# Patient Record
Sex: Female | Born: 1990
Health system: Southern US, Community
[De-identification: ages and names within clinical notes are randomized; demographics above are authoritative.]

## PROBLEM LIST (undated history)

## (undated) ENCOUNTER — Inpatient Hospital Stay (HOSPITAL_COMMUNITY): Payer: Self-pay

## (undated) DIAGNOSIS — G43909 Migraine, unspecified, not intractable, without status migrainosus: Secondary | ICD-10-CM

## (undated) DIAGNOSIS — Z973 Presence of spectacles and contact lenses: Secondary | ICD-10-CM

## (undated) DIAGNOSIS — N921 Excessive and frequent menstruation with irregular cycle: Secondary | ICD-10-CM

## (undated) DIAGNOSIS — R87619 Unspecified abnormal cytological findings in specimens from cervix uteri: Secondary | ICD-10-CM

## (undated) DIAGNOSIS — Z8742 Personal history of other diseases of the female genital tract: Secondary | ICD-10-CM

## (undated) DIAGNOSIS — A64 Unspecified sexually transmitted disease: Secondary | ICD-10-CM

## (undated) DIAGNOSIS — R87629 Unspecified abnormal cytological findings in specimens from vagina: Secondary | ICD-10-CM

## (undated) DIAGNOSIS — R569 Unspecified convulsions: Secondary | ICD-10-CM

## (undated) DIAGNOSIS — M1990 Unspecified osteoarthritis, unspecified site: Secondary | ICD-10-CM

## (undated) DIAGNOSIS — E538 Deficiency of other specified B group vitamins: Secondary | ICD-10-CM

## (undated) DIAGNOSIS — E559 Vitamin D deficiency, unspecified: Secondary | ICD-10-CM

## (undated) DIAGNOSIS — N946 Dysmenorrhea, unspecified: Secondary | ICD-10-CM

## (undated) DIAGNOSIS — N809 Endometriosis, unspecified: Secondary | ICD-10-CM

## (undated) DIAGNOSIS — D649 Anemia, unspecified: Secondary | ICD-10-CM

## (undated) DIAGNOSIS — R7303 Prediabetes: Secondary | ICD-10-CM

## (undated) DIAGNOSIS — I10 Essential (primary) hypertension: Secondary | ICD-10-CM

## (undated) DIAGNOSIS — I82409 Acute embolism and thrombosis of unspecified deep veins of unspecified lower extremity: Secondary | ICD-10-CM

## (undated) HISTORY — DX: Anemia, unspecified: D64.9

## (undated) HISTORY — DX: Essential (primary) hypertension: I10

## (undated) HISTORY — PX: WISDOM TOOTH EXTRACTION: SHX21

## (undated) HISTORY — DX: Unspecified osteoarthritis, unspecified site: M19.90

## (undated) HISTORY — PX: OTHER SURGICAL HISTORY: SHX169

## (undated) HISTORY — DX: Migraine, unspecified, not intractable, without status migrainosus: G43.909

## (undated) HISTORY — DX: Unspecified convulsions: R56.9

## (undated) HISTORY — DX: Unspecified abnormal cytological findings in specimens from vagina: R87.629

## (undated) HISTORY — DX: Endometriosis, unspecified: N80.9

## (undated) HISTORY — DX: Deficiency of other specified B group vitamins: E53.8

## (undated) HISTORY — DX: Vitamin D deficiency, unspecified: E55.9

## (undated) HISTORY — PX: CRYOTHERAPY: SHX1416

## (undated) HISTORY — DX: Unspecified sexually transmitted disease: A64

## (undated) HISTORY — DX: Prediabetes: R73.03

## (undated) HISTORY — DX: Acute embolism and thrombosis of unspecified deep veins of unspecified lower extremity: I82.409

## (undated) HISTORY — DX: Unspecified abnormal cytological findings in specimens from cervix uteri: R87.619

---
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
## 2009-01-15 ENCOUNTER — Inpatient Hospital Stay (HOSPITAL_COMMUNITY): Admission: AD | Admit: 2009-01-15 | Discharge: 2009-01-16 | Payer: Self-pay | Admitting: Obstetrics

## 2009-01-28 ENCOUNTER — Inpatient Hospital Stay (HOSPITAL_COMMUNITY): Admission: AD | Admit: 2009-01-28 | Discharge: 2009-01-31 | Payer: Self-pay | Admitting: Obstetrics

## 2010-10-20 DIAGNOSIS — F445 Conversion disorder with seizures or convulsions: Secondary | ICD-10-CM

## 2010-10-20 HISTORY — DX: Conversion disorder with seizures or convulsions: F44.5

## 2010-10-20 NOTE — L&D Delivery Note (Signed)
Delivery Note At  a viable unspecified sex was delivered via  (Presentation: ;  ).  APGAR: , ; weight .   Placenta status: , .  Cord:  with the following complications: .  Cord pH: not done  Anesthesia:   Episiotomy:  Lacerations:  Suture Repair: 2.0 Est. Blood Loss (mL):   Mom to postpartum.  Baby to nursery-stable.  Darya Bigler A 07/31/2011, 6:51 AM

## 2010-10-28 ENCOUNTER — Inpatient Hospital Stay (HOSPITAL_COMMUNITY)
Admission: EM | Admit: 2010-10-28 | Discharge: 2010-10-31 | Payer: Self-pay | Source: Home / Self Care | Attending: Internal Medicine | Admitting: Internal Medicine

## 2010-11-01 DIAGNOSIS — F449 Dissociative and conversion disorder, unspecified: Secondary | ICD-10-CM | POA: Insufficient documentation

## 2010-11-04 LAB — CBC
HCT: 37.2 % (ref 36.0–46.0)
Hemoglobin: 12.2 g/dL (ref 12.0–15.0)
MCH: 23.1 pg — ABNORMAL LOW (ref 26.0–34.0)
MCHC: 32.8 g/dL (ref 30.0–36.0)
MCV: 70.6 fL — ABNORMAL LOW (ref 78.0–100.0)
Platelets: 186 10*3/uL (ref 150–400)
RBC: 5.27 MIL/uL — ABNORMAL HIGH (ref 3.87–5.11)
RDW: 13.6 % (ref 11.5–15.5)
WBC: 6.6 10*3/uL (ref 4.0–10.5)

## 2010-11-04 LAB — URINALYSIS, ROUTINE W REFLEX MICROSCOPIC
Bilirubin Urine: NEGATIVE
Hgb urine dipstick: NEGATIVE
Ketones, ur: 15 mg/dL — AB
Nitrite: NEGATIVE
Protein, ur: NEGATIVE mg/dL
Specific Gravity, Urine: 1.029 (ref 1.005–1.030)
Urine Glucose, Fasting: 100 mg/dL — AB
Urobilinogen, UA: 1 mg/dL (ref 0.0–1.0)
pH: 6.5 (ref 5.0–8.0)

## 2010-11-04 LAB — POCT I-STAT, CHEM 8
BUN: 9 mg/dL (ref 6–23)
Calcium, Ion: 1.19 mmol/L (ref 1.12–1.32)
Chloride: 105 mEq/L (ref 96–112)
Creatinine, Ser: 0.8 mg/dL (ref 0.4–1.2)
Glucose, Bld: 126 mg/dL — ABNORMAL HIGH (ref 70–99)
HCT: 41 % (ref 36.0–46.0)
Hemoglobin: 13.9 g/dL (ref 12.0–15.0)
Potassium: 3.5 mEq/L (ref 3.5–5.1)
Sodium: 142 mEq/L (ref 135–145)
TCO2: 26 mmol/L (ref 0–100)

## 2010-11-04 LAB — D-DIMER, QUANTITATIVE: D-Dimer, Quant: 0.62 ug/mL-FEU — ABNORMAL HIGH (ref 0.00–0.48)

## 2010-11-04 LAB — GLUCOSE, CAPILLARY: Glucose-Capillary: 90 mg/dL (ref 70–99)

## 2010-11-04 LAB — HERPES SIMPLEX VIRUS(HSV) DNA BY PCR
HSV 1 DNA: NOT DETECTED
HSV 2 DNA: NOT DETECTED

## 2010-11-04 LAB — CSF CELL COUNT WITH DIFFERENTIAL
RBC Count, CSF: 1 /mm3 — ABNORMAL HIGH
Tube #: 3
WBC, CSF: 2 /mm3 (ref 0–5)

## 2010-11-04 LAB — COMPREHENSIVE METABOLIC PANEL
ALT: 13 U/L (ref 0–35)
AST: 18 U/L (ref 0–37)
Albumin: 3.7 g/dL (ref 3.5–5.2)
Alkaline Phosphatase: 48 U/L (ref 39–117)
BUN: 9 mg/dL (ref 6–23)
CO2: 26 mEq/L (ref 19–32)
Calcium: 9.2 mg/dL (ref 8.4–10.5)
Chloride: 108 mEq/L (ref 96–112)
Creatinine, Ser: 0.77 mg/dL (ref 0.4–1.2)
GFR calc Af Amer: >60 mL/min (ref >60)
GFR calc non Af Amer: >60 mL/min (ref >60)
Glucose, Bld: 129 mg/dL — ABNORMAL HIGH (ref 70–99)
Potassium: 3.5 mEq/L (ref 3.5–5.1)
Sodium: 140 mEq/L (ref 135–145)
Total Bilirubin: 0.4 mg/dL (ref 0.3–1.2)
Total Protein: 6.7 g/dL (ref 6.0–8.3)

## 2010-11-04 LAB — GRAM STAIN

## 2010-11-04 LAB — ETHANOL: Alcohol, Ethyl (B): <5 mg/dL (ref 0–10)

## 2010-11-04 LAB — PHENYTOIN LEVEL, TOTAL: Phenytoin Lvl: 17.2 ug/mL (ref 10.0–20.0)

## 2010-11-04 LAB — CK TOTAL AND CKMB (NOT AT ARMC)
CK, MB: 1.2 ng/mL (ref 0.3–4.0)
Relative Index: 1.1 (ref 0.0–2.5)
Total CK: 114 U/L (ref 7–177)

## 2010-11-04 LAB — RAPID URINE DRUG SCREEN, HOSP PERFORMED
Amphetamines: NOT DETECTED
Barbiturates: NOT DETECTED
Benzodiazepines: POSITIVE — AB
Cocaine: NOT DETECTED
Opiates: NOT DETECTED
Tetrahydrocannabinol: NOT DETECTED

## 2010-11-04 LAB — DIFFERENTIAL
Basophils Absolute: 0 10*3/uL (ref 0.0–0.1)
Basophils Relative: 1 % (ref 0–1)
Eosinophils Absolute: 0.2 10*3/uL (ref 0.0–0.7)
Eosinophils Relative: 3 % (ref 0–5)
Lymphocytes Relative: 29 % (ref 12–46)
Lymphs Abs: 1.9 10*3/uL (ref 0.7–4.0)
Monocytes Absolute: 0.5 10*3/uL (ref 0.1–1.0)
Monocytes Relative: 7 % (ref 3–12)
Neutro Abs: 4 10*3/uL (ref 1.7–7.7)
Neutrophils Relative %: 61 % (ref 43–77)

## 2010-11-04 LAB — TROPONIN I: Troponin I: 0.02 ng/mL (ref 0.00–0.06)

## 2010-11-04 LAB — CRYPTOCOCCAL ANTIGEN, CSF: Crypto Ag: NEGATIVE

## 2010-11-04 LAB — CSF CULTURE W GRAM STAIN: Culture: NO GROWTH

## 2010-11-04 LAB — POCT PREGNANCY, URINE: Preg Test, Ur: NEGATIVE

## 2010-11-04 LAB — ALBUMIN: Albumin: 2.8 g/dL — ABNORMAL LOW (ref 3.5–5.2)

## 2010-11-04 LAB — PROTEIN AND GLUCOSE, CSF
Glucose, CSF: 69 mg/dL (ref 43–76)
Total  Protein, CSF: 37 mg/dL (ref 15–45)

## 2010-11-06 LAB — MISCELLANEOUS TEST

## 2010-11-13 DIAGNOSIS — G43909 Migraine, unspecified, not intractable, without status migrainosus: Secondary | ICD-10-CM | POA: Insufficient documentation

## 2010-11-13 DIAGNOSIS — I808 Phlebitis and thrombophlebitis of other sites: Secondary | ICD-10-CM | POA: Insufficient documentation

## 2010-11-13 NOTE — Consult Note (Addendum)
[name], [name] NO.:  [id number]  MEDICAL RECORD NO.:  [id number]          PATIENT TYPE:  INP  LOCATION:  3028                         FACILITY:  [facility]  PHYSICIAN:  Joycelyn Schmid, MD   DATE OF BIRTH:  [birth year]  DATE OF CONSULTATION:  10/29/2010 DATE OF DISCHARGE:                                CONSULTATION   TIME OF CONSULTATION:  11 a.m.  REASON FOR CONSULTATION:  Seizure.  HISTORY OF PRESENT ILLNESS:  This is a 20 year old African American female with past medical history of migraines and childbirth x1.  The patient was brought to [facility] on October 28, 2010 for new- onset seizures.  Per family members, who were in the room, at approximately 10 p.m. on October 28, 2010, the patient was talking to her boyfriend on the phone.  She complained of some chest discomfort and suddenly went silent.  Her boyfriend could hear the child in the back yelling the mother's name, but there was no response.  The boyfriend immediately texted stated the mother, who called EMS and both the mother and boyfriend drove to the patient's house.  There, they found the patient nonresponsive, eyes rolled back with "twitching" activity. There is no comment if the patient was urinary incontinent, but there was no blood or sputum coming out of the patient's mouth.  The patient was transferred to [facility] ED, where the patient was given Ativan. The patient apparently had a second seizure while in the emergency department, but was not started on antiepileptic medication.  The patient was transferred to the floor on 2002, where she was watched overnight.  Per mother, she remained confused throughout the night as she did not recognize her own mother.  This morning, the patient's mother was in the room noted that her daughter was becoming very eggy. Right after becoming eggy, the patient went into the seizure-like activity.  This was described again as eyes rolled back,  all extremity twitching; however, no tongue biting, urinary incontinence, or foaming at the mouth was noted.  A code seizure was called, rapid response evaluated the patient and found the patient to be postictal.  Neurology was called at that time.  When I arrived in the room, the patient was significantly postictal, nonresponsive to pain or verbal stimuli.  I immediately called for a stat EEG, which is being done at the present time.  The patient was also started on 1.5 g of Dilantin load.  PAST MEDICAL HISTORY: 1. Migraines. 2. Wisdom teeth extraction.  MEDICATIONS:  The patient takes no medications per mother.  ALLERGIES:  LATEX.  FAMILY HISTORY:  Grandmother has diabetes and is now suffering from seizure secondary to diabetes.  Mother has hypertension.  SOCIAL HISTORY:  The patient does not smoke, drink, or do illicit drugs. Lives with her mother, has one child that is 38-year-old.  There is no history of tobacco or drug use.  REVIEW OF SYSTEMS:  The patient has in the past complained of decreased sleep, anxiety, chest discomfort, and headaches.  PHYSICAL EXAMINATION:  VITAL SIGNS:  Pulse is 92, blood pressure is 130/92, respiration at the present  time is 12, temperature is 98.4. NEUROLOGIC:  At the present time, the patient is postictal showing no purposeful movements.  Pupils are equal, round, and reactive to light and accommodating.  She has positive doll's eyes.  The patient does not respond to any noxious stimuli in all four extremities.  Deep tendon reflexes are 1+ throughout with downgoing toes.  When looking closely at her eyes, I do note a slight horizontal nystagmus occurring, but nothing overt.  I did not note any ocular deviation.  I did not note any tongue biting or blood noted in the mouth.  I did not note any urinary incontinence.  I also did not note any blink to threat. PULMONARY:  Clear to auscultation bilaterally. CARDIOVASCULAR:  S1 and S2 is  audible. NECK:  Supple.  LABORATORY DATA:  The patient received an LP on early morning of October 29, 2010.  2/3 showed colorless, 2 white blood cells, 1 red blood cell, 69 for glucose, and protein of 37.  UA was negative.  Sodium was 140, potassium 3.5, chloride 108, CO2 is 26, BUN 9, creatinine 0.77, glucose 129.  White blood cell count 6.6, platelets 186, hemoglobin 12.2, hematocrit 37.2.  IMAGING:  CT of head was negative for any mass or bleed.  ASSESSMENT:  This is a 20 year old female with new-onset of seizure x3, etiology unclear at this time.  The patient's family states that she has been under a lot of stressors this 2010, but nothing significant as of the last few days.  Mother states that she has had some decreased sleep lately.  At this time, recommendations would be obtaining EEG, which is in progress.  To continue Dilantin 100 mg t.i.d. with pharmacy to dose Dilantin level in the morning.  When capable, obtain MRI of the brain with and without contrast.     Felicie Morn, PA-C   ______________________________ Joycelyn Schmid, MD    DS/MEDQ  D:  10/29/2010  T:  10/30/2010  Job:  [id number]  Electronically Signed by Felicie Morn PA-C on 10/30/2010 12:14:03 PM Electronically Signed by Joycelyn Schmid  on 11/13/2010 02:58:24 PM

## 2010-12-26 ENCOUNTER — Inpatient Hospital Stay (HOSPITAL_COMMUNITY): Payer: Medicaid Other

## 2010-12-26 ENCOUNTER — Inpatient Hospital Stay (HOSPITAL_COMMUNITY)
Admission: AD | Admit: 2010-12-26 | Discharge: 2010-12-26 | Disposition: A | Discharge status: Home / Self Care | Payer: Medicaid Other | Source: Ambulatory Visit | Attending: Obstetrics | Admitting: Obstetrics

## 2010-12-26 DIAGNOSIS — A499 Bacterial infection, unspecified: Secondary | ICD-10-CM

## 2010-12-26 DIAGNOSIS — B9689 Other specified bacterial agents as the cause of diseases classified elsewhere: Secondary | ICD-10-CM | POA: Insufficient documentation

## 2010-12-26 DIAGNOSIS — O2390 Unspecified genitourinary tract infection in pregnancy, unspecified trimester: Secondary | ICD-10-CM | POA: Insufficient documentation

## 2010-12-26 DIAGNOSIS — O209 Hemorrhage in early pregnancy, unspecified: Secondary | ICD-10-CM

## 2010-12-26 DIAGNOSIS — N760 Acute vaginitis: Secondary | ICD-10-CM | POA: Insufficient documentation

## 2010-12-26 LAB — CBC
HCT: 36.4 % (ref 36.0–46.0)
Hemoglobin: 11.8 g/dL — ABNORMAL LOW (ref 12.0–15.0)
MCV: 70.5 fL — ABNORMAL LOW (ref 78.0–100.0)
RBC: 5.16 MIL/uL — ABNORMAL HIGH (ref 3.87–5.11)
WBC: 8.7 10*3/uL (ref 4.0–10.5)

## 2010-12-26 LAB — WET PREP, GENITAL
Trich, Wet Prep: NONE SEEN
Yeast Wet Prep HPF POC: NONE SEEN

## 2010-12-26 LAB — URINALYSIS, ROUTINE W REFLEX MICROSCOPIC
Bilirubin Urine: NEGATIVE
Glucose, UA: NEGATIVE mg/dL
Hgb urine dipstick: NEGATIVE
Ketones, ur: 40 mg/dL — AB
Protein, ur: NEGATIVE mg/dL
Urobilinogen, UA: 0.2 mg/dL (ref 0.0–1.0)

## 2010-12-26 LAB — URINE MICROSCOPIC-ADD ON

## 2010-12-26 LAB — HCG, QUANTITATIVE, PREGNANCY: hCG, Beta Chain, Quant, S: 154547 m[IU]/mL — ABNORMAL HIGH (ref <5)

## 2010-12-27 LAB — GC/CHLAMYDIA PROBE AMP, GENITAL: GC Probe Amp, Genital: NEGATIVE

## 2011-01-06 LAB — RUBELLA ANTIBODY, IGM: Rubella: NON-IMMUNE/NOT IMMUNE

## 2011-01-29 LAB — CBC
HCT: 32 % — ABNORMAL LOW (ref 36.0–46.0)
MCHC: 32.5 g/dL (ref 30.0–36.0)
MCV: 74.8 fL — ABNORMAL LOW (ref 78.0–100.0)
MCV: 75.6 fL — ABNORMAL LOW (ref 78.0–100.0)
Platelets: 134 10*3/uL — ABNORMAL LOW (ref 150–400)
RBC: 4.89 MIL/uL (ref 3.87–5.11)
RDW: 14.7 % (ref 11.5–15.5)
RDW: 14.8 % (ref 11.5–15.5)
WBC: 12.8 10*3/uL — ABNORMAL HIGH (ref 4.0–10.5)

## 2011-01-29 LAB — RPR: RPR Ser Ql: NONREACTIVE

## 2011-02-13 ENCOUNTER — Inpatient Hospital Stay (HOSPITAL_COMMUNITY)
Admission: AD | Admit: 2011-02-13 | Discharge: 2011-02-13 | Disposition: A | Discharge status: Home / Self Care | Payer: Medicaid Other | Source: Ambulatory Visit | Attending: Obstetrics | Admitting: Obstetrics

## 2011-02-13 DIAGNOSIS — N949 Unspecified condition associated with female genital organs and menstrual cycle: Secondary | ICD-10-CM | POA: Insufficient documentation

## 2011-02-13 DIAGNOSIS — B3731 Acute candidiasis of vulva and vagina: Secondary | ICD-10-CM | POA: Insufficient documentation

## 2011-02-13 DIAGNOSIS — B373 Candidiasis of vulva and vagina: Secondary | ICD-10-CM | POA: Insufficient documentation

## 2011-02-13 DIAGNOSIS — O2390 Unspecified genitourinary tract infection in pregnancy, unspecified trimester: Secondary | ICD-10-CM

## 2011-02-13 LAB — WET PREP, GENITAL

## 2011-03-23 ENCOUNTER — Emergency Department (HOSPITAL_COMMUNITY)
Admission: EM | Admit: 2011-03-23 | Discharge: 2011-03-23 | Disposition: A | Discharge status: Home / Self Care | Payer: Medicaid Other | Attending: Emergency Medicine | Admitting: Emergency Medicine

## 2011-03-23 DIAGNOSIS — O99891 Other specified diseases and conditions complicating pregnancy: Secondary | ICD-10-CM | POA: Insufficient documentation

## 2011-03-23 DIAGNOSIS — G40909 Epilepsy, unspecified, not intractable, without status epilepticus: Secondary | ICD-10-CM | POA: Insufficient documentation

## 2011-03-23 LAB — URINALYSIS, ROUTINE W REFLEX MICROSCOPIC
Bilirubin Urine: NEGATIVE
Leukocytes, UA: NEGATIVE
Nitrite: NEGATIVE
Specific Gravity, Urine: 1.023 (ref 1.005–1.030)
Urobilinogen, UA: 1 mg/dL (ref 0.0–1.0)

## 2011-03-23 LAB — CBC
HCT: 32.6 % — ABNORMAL LOW (ref 36.0–46.0)
Hemoglobin: 11.1 g/dL — ABNORMAL LOW (ref 12.0–15.0)
MCH: 23.8 pg — ABNORMAL LOW (ref 26.0–34.0)
MCHC: 34 g/dL (ref 30.0–36.0)
MCV: 70 fL — ABNORMAL LOW (ref 78.0–100.0)
RDW: 13.9 % (ref 11.5–15.5)

## 2011-03-23 LAB — DIFFERENTIAL
Eosinophils Relative: 1 % (ref 0–5)
Lymphs Abs: 1.5 10*3/uL (ref 0.7–4.0)
Monocytes Absolute: 0.5 10*3/uL (ref 0.1–1.0)
Monocytes Relative: 6 % (ref 3–12)
Neutro Abs: 6.2 10*3/uL (ref 1.7–7.7)

## 2011-03-23 LAB — MAGNESIUM: Magnesium: 1.8 mg/dL (ref 1.5–2.5)

## 2011-03-23 LAB — COMPREHENSIVE METABOLIC PANEL
ALT: 8 U/L (ref 0–35)
BUN: 6 mg/dL (ref 6–23)
CO2: 22 mEq/L (ref 19–32)
Calcium: 8.4 mg/dL (ref 8.4–10.5)
Creatinine, Ser: 0.5 mg/dL (ref 0.4–1.2)
GFR calc non Af Amer: >60 mL/min (ref >60)
Glucose, Bld: 86 mg/dL (ref 70–99)
Total Protein: 6.2 g/dL (ref 6.0–8.3)

## 2011-03-23 LAB — URINE MICROSCOPIC-ADD ON

## 2011-04-06 ENCOUNTER — Inpatient Hospital Stay (HOSPITAL_COMMUNITY)
Admission: AD | Admit: 2011-04-06 | Discharge: 2011-04-07 | Disposition: A | Discharge status: Home / Self Care | Payer: Medicaid Other | Source: Ambulatory Visit | Attending: Obstetrics | Admitting: Obstetrics

## 2011-04-06 DIAGNOSIS — O99891 Other specified diseases and conditions complicating pregnancy: Secondary | ICD-10-CM | POA: Insufficient documentation

## 2011-04-06 DIAGNOSIS — O9989 Other specified diseases and conditions complicating pregnancy, childbirth and the puerperium: Secondary | ICD-10-CM

## 2011-04-07 ENCOUNTER — Inpatient Hospital Stay (HOSPITAL_COMMUNITY): Payer: Medicaid Other

## 2011-06-15 ENCOUNTER — Encounter (HOSPITAL_COMMUNITY): Payer: Self-pay | Admitting: Emergency Medicine

## 2011-06-15 ENCOUNTER — Emergency Department (HOSPITAL_COMMUNITY)
Admission: EM | Admit: 2011-06-15 | Discharge: 2011-06-15 | Disposition: A | Discharge status: Home / Self Care | Payer: Medicaid Other | Attending: Emergency Medicine | Admitting: Emergency Medicine

## 2011-06-15 DIAGNOSIS — O99891 Other specified diseases and conditions complicating pregnancy: Secondary | ICD-10-CM | POA: Insufficient documentation

## 2011-06-15 DIAGNOSIS — R569 Unspecified convulsions: Secondary | ICD-10-CM | POA: Clinically undetermined

## 2011-06-15 LAB — POCT I-STAT, CHEM 8
Calcium, Ion: 1.16 mmol/L (ref 1.12–1.32)
Creatinine, Ser: 0.6 mg/dL (ref 0.50–1.10)
Glucose, Bld: 86 mg/dL (ref 70–99)
Hemoglobin: 12.6 g/dL (ref 12.0–15.0)
Potassium: 4.3 mEq/L (ref 3.5–5.1)

## 2011-06-15 LAB — RAPID URINE DRUG SCREEN, HOSP PERFORMED: Benzodiazepines: NOT DETECTED

## 2011-06-15 LAB — URINALYSIS, ROUTINE W REFLEX MICROSCOPIC
Bilirubin Urine: NEGATIVE
Hgb urine dipstick: NEGATIVE
Specific Gravity, Urine: 1.015 (ref 1.005–1.030)
Urobilinogen, UA: 0.2 mg/dL (ref 0.0–1.0)
pH: 5.5 (ref 5.0–8.0)

## 2011-06-15 LAB — URINE MICROSCOPIC-ADD ON

## 2011-06-15 NOTE — ED Notes (Signed)
06/15/11-  Pt has no pain, no s/s of pre-eclampsia; bp's wnl, reflexes 2+, no clonus, no swelling, no ruq pain, no visual changes/disturbances, no headache; fhr reactive and reassurring. K.Karen Huhta, RNC

## 2011-06-15 NOTE — Progress Notes (Signed)
06/15/11-RROB and Dr Ethelda Chick spoke with Dr Gaynell Face about pt, s/s, fhr reactive, tests, etc.  Dr Gaynell Face said pt may be d/c'd if fhr reactive/reassurring and cleared by ed.  EFM strip reviewed by Marcelino Freestone before taking off monitors.  Pt given antenatal d/c instructions, signed and given a copy, pt has dr appt with Dr Gaynell Face 8/27 and will follow up with him.  Pt to be d/c'd home after receiving d/c instructions and paperwork from ED.  Florentina Addison Menucha Dicesare,RNC-OB  06/15/11 1512  Fetal Heart Rate A  Baseline Rate 120 bpm  Variability 6-25 BPM  Accelerations 15 x 15  Uterine Activity  Contraction Frequency (min) none     06/15/11 1512  Fetal Heart Rate A  Baseline Rate 120 bpm  Variability 6-25 BPM  Accelerations 15 x 15  Uterine Activity  Contraction Frequency (min) none

## 2011-06-30 ENCOUNTER — Encounter (HOSPITAL_COMMUNITY): Payer: Self-pay | Admitting: *Deleted

## 2011-06-30 ENCOUNTER — Inpatient Hospital Stay (HOSPITAL_COMMUNITY)
Admission: AD | Admit: 2011-06-30 | Discharge: 2011-06-30 | Disposition: A | Discharge status: Home / Self Care | Payer: Medicaid Other | Source: Ambulatory Visit | Attending: Obstetrics | Admitting: Obstetrics

## 2011-06-30 DIAGNOSIS — O99891 Other specified diseases and conditions complicating pregnancy: Secondary | ICD-10-CM | POA: Insufficient documentation

## 2011-06-30 DIAGNOSIS — N898 Other specified noninflammatory disorders of vagina: Secondary | ICD-10-CM

## 2011-06-30 DIAGNOSIS — O26899 Other specified pregnancy related conditions, unspecified trimester: Secondary | ICD-10-CM

## 2011-06-30 LAB — WET PREP, GENITAL
Clue Cells Wet Prep HPF POC: NONE SEEN
Trich, Wet Prep: NONE SEEN
Yeast Wet Prep HPF POC: NONE SEEN

## 2011-06-30 LAB — POCT FERN TEST: Fern Test: NEGATIVE

## 2011-06-30 NOTE — ED Provider Notes (Signed)
History   Pt presents today stating she thinks she may be leaking fluid since about 1pm. She denies vag bleeding and reports GFM. She has no other complaints at this time.  No chief complaint on file.  HPI  OB History    Grav Para Term Preterm Abortions TAB SAB Ect Mult Living   2 1 1  0 0 0 0 0 0 1      Past Medical History  Diagnosis Date  . No pertinent past medical history     Past Surgical History  Procedure Date  . None   . Wisdom tooth extraction     No family history on file.  History  Substance Use Topics  . Smoking status: Never Smoker   . Smokeless tobacco: Not on file  . Alcohol Use: No    Allergies: No Known Allergies  Prescriptions prior to admission  Medication Sig Dispense Refill  . prenatal vitamin w/FE, FA (PRENATAL 1 + 1) 27-1 MG TABS Take 1 tablet by mouth daily.          Review of Systems  Constitutional: Negative for fever.  Cardiovascular: Negative for chest pain.  Gastrointestinal: Negative for nausea, vomiting, abdominal pain, diarrhea and constipation.  Genitourinary: Negative for dysuria, urgency, frequency, hematuria and flank pain.  Neurological: Negative for dizziness and headaches.  Psychiatric/Behavioral: Negative for depression and suicidal ideas.   Physical Exam   Blood pressure 111/69, pulse 88, temperature 98.4 F (36.9 C), temperature source Oral, resp. rate 20, height 5\' 8"  (1.727 m), weight 190 lb (86.183 kg).  Physical Exam  Constitutional: She is oriented to person, place, and time. She appears well-developed and well-nourished. No distress.  HENT:  Head: Normocephalic and atraumatic.  Eyes: EOM are normal. Pupils are equal, round, and reactive to light.  GI: Soft. She exhibits no distension. There is no tenderness. There is no rebound and no guarding.  Genitourinary: No bleeding around the vagina. Vaginal discharge found.       Cervix Lg/thick/closed.  Neurological: She is alert and oriented to person, place, and  time.  Skin: Skin is warm and dry. She is not diaphoretic.  Psychiatric: Her behavior is normal. Judgment and thought content normal.    MAU Course  Procedures  Fern test negative.  Wet prepped done.  Assessment and Plan  Care of this pt is being transferred to Jeani Sow, FNP.  Clinton Gallant. Rice III, DrHSc, MPAS, PA-C  06/30/2011, 6:04 PM   Henrietta Hoover, PA 06/30/11 1830  Matt Holmes, NP 06/30/11 1846

## 2011-06-30 NOTE — Progress Notes (Signed)
Dr's appt today (cervix was not checked)  About an hour later had leaking, has been off and on-- clear and thick and runny

## 2011-06-30 NOTE — ED Notes (Signed)
Discharging patient for E. Rice, PA.  Wet prep is negative.  May discharge to home.  Matt Holmes, NP 06/30/11 [phone]

## 2011-07-18 ENCOUNTER — Inpatient Hospital Stay (HOSPITAL_COMMUNITY)
Admission: AD | Admit: 2011-07-18 | Discharge: 2011-07-18 | Disposition: A | Discharge status: Home / Self Care | Payer: Medicaid Other | Source: Ambulatory Visit | Attending: Obstetrics | Admitting: Obstetrics

## 2011-07-18 ENCOUNTER — Encounter (HOSPITAL_COMMUNITY): Payer: Self-pay | Admitting: Obstetrics and Gynecology

## 2011-07-18 DIAGNOSIS — O99891 Other specified diseases and conditions complicating pregnancy: Secondary | ICD-10-CM | POA: Insufficient documentation

## 2011-07-18 NOTE — ED Provider Notes (Signed)
[name] is a 20 y.o. female at [redacted] weeks gestation who presents to MAU for ? Rupture of membranes. Sterile spec. Exam done to obtain slide to evaluate for ferning. Slide is negative for fern. RN called Dr. Gaynell Face with report on patient.   [place], [place] 07/18/11 [phone]

## 2011-07-18 NOTE — Progress Notes (Signed)
Pt states, " I was in bed last night ( 2400) and i felt water down my legs.It soaked thru my clothes. I didn't have anymore come out. I started having contractions about an hour ago, and they are 12 minutes apart."

## 2011-07-26 ENCOUNTER — Encounter (HOSPITAL_COMMUNITY): Payer: Self-pay | Admitting: *Deleted

## 2011-07-26 ENCOUNTER — Inpatient Hospital Stay (HOSPITAL_COMMUNITY)
Admission: AD | Admit: 2011-07-26 | Discharge: 2011-07-26 | Disposition: A | Discharge status: Home / Self Care | Payer: Medicaid Other | Source: Ambulatory Visit | Attending: Obstetrics | Admitting: Obstetrics

## 2011-07-26 DIAGNOSIS — O479 False labor, unspecified: Secondary | ICD-10-CM | POA: Insufficient documentation

## 2011-07-26 HISTORY — DX: Unspecified convulsions: R56.9

## 2011-07-26 NOTE — Discharge Instructions (Signed)
False Labor (Braxton Hicks Contractions) °Pregnancy is commonly associated with contractions of the uterus throughout the pregnancy. Towards the end of pregnancy (32-34 weeks), these contractions (Braxton Hicks) can develop more often and may become more forceful. This is not true labor because these contractions do not result in opening (dilatation) and thinning of the cervix. They are sometimes difficult to tell apart from true labor because these contractions can be forceful and people have different pain tolerances. You should not feel embarrassed if you go to the hospital with false labor. Sometimes, the only way to tell if you are in true labor is for your caregiver to follow the changes in the cervix. °HOW TO TELL THE DIFFERENCE BETWEEN TRUE AND FALSE LABOR °· False labor.  °· The contractions of false labor are usually shorter, irregular and not as hard as those of true labor.  °· They are often felt in the front of the lower abdomen and in the groin.  °· They may leave with walking around or changing positions while lying down.  °· They get weaker and are shorter lasting as time goes on.  °· These contractions are usually irregular.  °· They do not usually become progressively stronger, regular and closer together as with true labor.  °· True labor.  °· Contractions in true labor last 30 to 70 seconds, become very regular, usually become more intense, and increase in frequency.  °· They do not go away with walking.  °· The discomfort is usually felt in the top of the uterus and spreads to the lower abdomen and low back.  °· True labor can be determined by your caregiver with an exam. This will show that the cervix is dilating and getting thinner.  °If there are no prenatal problems or other health problems associated with the pregnancy, it is completely safe to be sent home with false labor and await the onset of true labor. °HOME CARE INSTRUCTIONS °· Keep up with your usual exercises and instructions.   °· Take medications as directed.  °· Keep your regular prenatal appointment.  °· Eat and drink lightly if you think you are going into labor.  °· If BH contractions are making you uncomfortable:  °· Change your activity position from lying down or resting to walking/walking to resting.  °· Sit and rest in a tub of warm water.  °· Drink 2 to 3 glasses of water. Dehydration may cause B-H contractions.  °· Do slow and deep breathing several times an hour.  °SEEK IMMEDIATE MEDICAL CARE IF: °· Your contractions continue to become stronger, more regular, and closer together.  °· You have a gushing, burst or leaking of fluid from the vagina.  °· An oral temperature above 100.4 develops.  °· You have passage of blood-tinged mucus.  °· You develop vaginal bleeding.  °· You develop continuous belly (abdominal) pain.  °· You have low back pain that you never had before.  °· You feel the baby’s head pushing down causing pelvic pressure.  °· The baby is not moving as much as it used to.  °Document Released: 10/06/2005 Document Re-Released: 03/26/2010 °Exit[employer]® Patient Information ©2011 [employer]. °

## 2011-07-26 NOTE — Progress Notes (Signed)
Dr Tina Griffiths notified of pt's admission and status. Aware of uterine activity, sve, fhr now reactive after being alittle flat initially and mild v-shaped variables, constant low back pain all day-worse with movement. Pt may go home.

## 2011-07-26 NOTE — Progress Notes (Signed)
Written and verbal d/c instructions given and understanding voiced. Given sheet 'Preg. And low back pain'. Has appt Monday with Dr Gaynell Face.

## 2011-07-26 NOTE — Progress Notes (Signed)
Water to pt

## 2011-07-26 NOTE — Progress Notes (Signed)
Contractions today have lessened now having back pains.

## 2011-07-31 ENCOUNTER — Encounter (HOSPITAL_COMMUNITY): Payer: Self-pay | Admitting: Anesthesiology

## 2011-07-31 ENCOUNTER — Inpatient Hospital Stay (HOSPITAL_COMMUNITY): Payer: Medicaid Other | Admitting: Anesthesiology

## 2011-07-31 ENCOUNTER — Encounter (HOSPITAL_COMMUNITY): Payer: Self-pay | Admitting: *Deleted

## 2011-07-31 ENCOUNTER — Inpatient Hospital Stay (HOSPITAL_COMMUNITY)
Admission: AD | Admit: 2011-07-31 | Discharge: 2011-08-02 | DRG: 775 | Disposition: A | Discharge status: Home / Self Care | Payer: Medicaid Other | Source: Ambulatory Visit | Attending: Obstetrics | Admitting: Obstetrics

## 2011-07-31 DIAGNOSIS — R569 Unspecified convulsions: Secondary | ICD-10-CM

## 2011-07-31 LAB — CBC
HCT: 36.2 % (ref 36.0–46.0)
Hemoglobin: 12 g/dL (ref 12.0–15.0)
RBC: 4.99 MIL/uL (ref 3.87–5.11)

## 2011-07-31 LAB — COMPREHENSIVE METABOLIC PANEL
ALT: 9 U/L (ref 0–35)
AST: 17 U/L (ref 0–37)
Alkaline Phosphatase: 158 U/L — ABNORMAL HIGH (ref 39–117)
CO2: 26 mEq/L (ref 19–32)
Chloride: 104 mEq/L (ref 96–112)
GFR calc non Af Amer: >90 mL/min (ref >90)
Sodium: 136 mEq/L (ref 135–145)
Total Bilirubin: 0.3 mg/dL (ref 0.3–1.2)

## 2011-07-31 LAB — RPR: RPR Ser Ql: NONREACTIVE

## 2011-07-31 MED ORDER — SENNOSIDES-DOCUSATE SODIUM 8.6-50 MG PO TABS
2.0000 | ORAL_TABLET | Freq: Every day | ORAL | Status: DC
  Administered 2011-07-31: 2 via ORAL

## 2011-07-31 MED ORDER — SODIUM CHLORIDE 0.9 % IV SOLN: 250.0000 mL | INTRAVENOUS | Status: DC

## 2011-07-31 MED ORDER — LIDOCAINE HCL (PF) 1 % IJ SOLN
30.0000 mL | INTRAMUSCULAR | Status: DC | PRN
  Filled 2011-07-31 (×2): qty 30

## 2011-07-31 MED ORDER — DIPHENHYDRAMINE HCL 50 MG/ML IJ SOLN: 12.5000 mg | INTRAMUSCULAR | Status: DC | PRN

## 2011-07-31 MED ORDER — LACTATED RINGERS IV SOLN: 500.0000 mL | Freq: Once | INTRAVENOUS | Status: DC

## 2011-07-31 MED ORDER — SODIUM CHLORIDE 0.9 % IJ SOLN: 3.0000 mL | INTRAMUSCULAR | Status: DC | PRN

## 2011-07-31 MED ORDER — LACTATED RINGERS IV SOLN
INTRAVENOUS | Status: DC
  Administered 2011-07-31: 125 mL/h via INTRAVENOUS

## 2011-07-31 MED ORDER — FENTANYL 2.5 MCG/ML BUPIVACAINE 1/10 % EPIDURAL INFUSION (WH - ANES)
INTRAMUSCULAR | Status: DC | PRN
  Administered 2011-07-31: 14 mL/h via EPIDURAL

## 2011-07-31 MED ORDER — SIMETHICONE 80 MG PO CHEW: 80.0000 mg | CHEWABLE_TABLET | ORAL | Status: DC | PRN

## 2011-07-31 MED ORDER — ONDANSETRON HCL 4 MG/2ML IJ SOLN: 4.0000 mg | INTRAMUSCULAR | Status: DC | PRN

## 2011-07-31 MED ORDER — FENTANYL 2.5 MCG/ML BUPIVACAINE 1/10 % EPIDURAL INFUSION (WH - ANES)
14.0000 mL/h | INTRAMUSCULAR | Status: DC
Start: 2011-07-31 — End: 2011-07-31

## 2011-07-31 MED ORDER — PHENYLEPHRINE 40 MCG/ML (10ML) SYRINGE FOR IV PUSH (FOR BLOOD PRESSURE SUPPORT)
80.0000 ug | PREFILLED_SYRINGE | INTRAVENOUS | Status: DC | PRN
  Filled 2011-07-31: qty 5

## 2011-07-31 MED ORDER — OXYTOCIN 20 UNITS IN LACTATED RINGERS INFUSION - SIMPLE
125.0000 mL/h | Freq: Once | INTRAVENOUS | Status: AC
  Administered 2011-07-31: 125 mL/h via INTRAVENOUS
  Filled 2011-07-31: qty 1000

## 2011-07-31 MED ORDER — FERROUS SULFATE 325 (65 FE) MG PO TABS
325.0000 mg | ORAL_TABLET | Freq: Two times a day (BID) | ORAL | Status: DC
  Administered 2011-07-31 – 2011-08-02 (×4): 325 mg via ORAL
  Filled 2011-07-31 (×4): qty 1

## 2011-07-31 MED ORDER — LIDOCAINE HCL 1.5 % IJ SOLN
INTRAMUSCULAR | Status: DC | PRN
  Administered 2011-07-31 (×2): 5 mL via EPIDURAL

## 2011-07-31 MED ORDER — BUTORPHANOL TARTRATE 2 MG/ML IJ SOLN: 1.0000 mg | INTRAMUSCULAR | Status: DC | PRN

## 2011-07-31 MED ORDER — FLEET ENEMA 7-19 GM/118ML RE ENEM: 1.0000 | ENEMA | RECTAL | Status: DC | PRN

## 2011-07-31 MED ORDER — WITCH HAZEL-GLYCERIN EX PADS: 1.0000 "application " | MEDICATED_PAD | CUTANEOUS | Status: DC | PRN

## 2011-07-31 MED ORDER — DIBUCAINE 1 % RE OINT: 1.0000 "application " | TOPICAL_OINTMENT | RECTAL | Status: DC | PRN

## 2011-07-31 MED ORDER — IBUPROFEN 600 MG PO TABS
600.0000 mg | ORAL_TABLET | Freq: Four times a day (QID) | ORAL | Status: DC
  Administered 2011-07-31 – 2011-08-02 (×7): 600 mg via ORAL
  Filled 2011-07-31 (×7): qty 1

## 2011-07-31 MED ORDER — OXYCODONE-ACETAMINOPHEN 5-325 MG PO TABS
2.0000 | ORAL_TABLET | ORAL | Status: DC | PRN
  Administered 2011-07-31: 2 via ORAL
  Filled 2011-07-31: qty 2

## 2011-07-31 MED ORDER — EPHEDRINE 5 MG/ML INJ
10.0000 mg | INTRAVENOUS | Status: DC | PRN
Start: 2011-07-31 — End: 2011-07-31
  Filled 2011-07-31 (×2): qty 4

## 2011-07-31 MED ORDER — OXYTOCIN BOLUS FROM INFUSION
500.0000 mL | Freq: Once | INTRAVENOUS | Status: DC
  Filled 2011-07-31: qty 500
  Filled 2011-07-31: qty 1000

## 2011-07-31 MED ORDER — CITRIC ACID-SODIUM CITRATE 334-500 MG/5ML PO SOLN: 30.0000 mL | ORAL | Status: DC | PRN

## 2011-07-31 MED ORDER — PHENYLEPHRINE 40 MCG/ML (10ML) SYRINGE FOR IV PUSH (FOR BLOOD PRESSURE SUPPORT)
80.0000 ug | PREFILLED_SYRINGE | INTRAVENOUS | Status: DC | PRN
  Filled 2011-07-31 (×2): qty 5

## 2011-07-31 MED ORDER — BENZOCAINE-MENTHOL 20-0.5 % EX AERO: 1.0000 "application " | INHALATION_SPRAY | CUTANEOUS | Status: DC | PRN

## 2011-07-31 MED ORDER — EPHEDRINE 5 MG/ML INJ
10.0000 mg | INTRAVENOUS | Status: DC | PRN
  Filled 2011-07-31: qty 4

## 2011-07-31 MED ORDER — PRENATAL PLUS 27-1 MG PO TABS
1.0000 | ORAL_TABLET | Freq: Every day | ORAL | Status: DC
  Administered 2011-08-01 – 2011-08-02 (×2): 1 via ORAL
  Filled 2011-07-31 (×2): qty 1

## 2011-07-31 MED ORDER — IBUPROFEN 600 MG PO TABS
600.0000 mg | ORAL_TABLET | Freq: Four times a day (QID) | ORAL | Status: DC | PRN
  Administered 2011-07-31: 600 mg via ORAL
  Filled 2011-07-31: qty 1

## 2011-07-31 MED ORDER — DIPHENHYDRAMINE HCL 25 MG PO CAPS: 25.0000 mg | ORAL_CAPSULE | Freq: Four times a day (QID) | ORAL | Status: DC | PRN

## 2011-07-31 MED ORDER — OXYCODONE-ACETAMINOPHEN 5-325 MG PO TABS: 1.0000 | ORAL_TABLET | ORAL | Status: DC | PRN

## 2011-07-31 MED ORDER — PROMETHAZINE HCL 25 MG/ML IJ SOLN: 12.5000 mg | INTRAMUSCULAR | Status: DC | PRN

## 2011-07-31 MED ORDER — LACTATED RINGERS IV SOLN: 500.0000 mL | INTRAVENOUS | Status: DC | PRN

## 2011-07-31 MED ORDER — ZOLPIDEM TARTRATE 5 MG PO TABS: 5.0000 mg | ORAL_TABLET | Freq: Every evening | ORAL | Status: DC | PRN

## 2011-07-31 MED ORDER — TETANUS-DIPHTH-ACELL PERTUSSIS 5-2.5-18.5 LF-MCG/0.5 IM SUSP: 0.5000 mL | Freq: Once | INTRAMUSCULAR | Status: DC

## 2011-07-31 MED ORDER — ONDANSETRON HCL 4 MG PO TABS: 4.0000 mg | ORAL_TABLET | ORAL | Status: DC | PRN

## 2011-07-31 MED ORDER — FENTANYL 2.5 MCG/ML BUPIVACAINE 1/10 % EPIDURAL INFUSION (WH - ANES)
14.0000 mL/h | INTRAMUSCULAR | Status: DC
  Filled 2011-07-31: qty 60

## 2011-07-31 MED ORDER — ACETAMINOPHEN 325 MG PO TABS: 650.0000 mg | ORAL_TABLET | ORAL | Status: DC | PRN

## 2011-07-31 MED ORDER — LANOLIN HYDROUS EX OINT: TOPICAL_OINTMENT | CUTANEOUS | Status: DC | PRN

## 2011-07-31 MED ORDER — SODIUM CHLORIDE 0.9 % IJ SOLN: 3.0000 mL | Freq: Two times a day (BID) | INTRAMUSCULAR | Status: DC

## 2011-07-31 MED ORDER — ONDANSETRON HCL 4 MG/2ML IJ SOLN: 4.0000 mg | Freq: Four times a day (QID) | INTRAMUSCULAR | Status: DC | PRN

## 2011-07-31 NOTE — H&P (Signed)
In in in in this is Dr. Francoise Ceo dictating the history and physical on  Lounell Reichl She's a 20 year old gravida 2 para 101 at 39 weeks and 6 days EDC 1012 she has been negative GBS  Was admitted in labor she is now 9 cm 0 station membranes ruptured spontaneously and the fluid was clear Past medical history negative Past surgical history negative Social history negative System review negative Physical exam well-developed female in no distress HEENT negative Lungs clear Heart regular rhythm no murmurs no gallops Breasts negative Abdomen term Pelvic as described above extremities negative and

## 2011-07-31 NOTE — Progress Notes (Signed)
Pt rerports having ctx q 4 min since 0230. Denies SROM or bleeding at this time and reportd good fetal movement

## 2011-07-31 NOTE — Plan of Care (Signed)
Problem: Discharge Progression Outcomes Goal: MMR given as ordered Outcome: Not Progressing Pt refused MMR though she is non immune.

## 2011-07-31 NOTE — Anesthesia Procedure Notes (Signed)
Epidural Patient location during procedure: OB Start time: 07/31/2011 6:05 AM End time: 07/31/2011 6:09 AM Reason for block: procedure for pain  Staffing Anesthesiologist: Sandrea Hughs Performed by: anesthesiologist   Preanesthetic Checklist Completed: patient identified, site marked, surgical consent, pre-op evaluation, timeout performed, IV checked, risks and benefits discussed and monitors and equipment checked  Epidural Patient position: sitting Prep: site prepped and draped and DuraPrep Patient monitoring: continuous pulse ox and blood pressure Approach: midline Injection technique: LOR air  Needle:  Needle type: Tuohy  Needle gauge: 17 G Needle length: 9 cm Needle insertion depth: 5 cm cm Catheter type: closed end flexible Catheter size: 19 Gauge Catheter at skin depth: 10 cm Test dose: negative and 1.5% lidocaine  Assessment Sensory level: T7 Events: blood not aspirated, injection not painful, no injection resistance, negative IV test and no paresthesia

## 2011-07-31 NOTE — Progress Notes (Signed)
Notified of SVE and ctx. Order to admit to L&D

## 2011-07-31 NOTE — Progress Notes (Signed)
Patient has history of seizure disorder and has been evaluated by neurologist, not on medication. Has displayed minimal interest in baby and touched the baby only after the grandmother insisted and encouraged. Pt and pt's mother have requested a tubal ligation to be scheduled at 6 week checkup. Pt seized during labor and had limited ability to  assist with delivery.  Encouraged and assisted patient with breastfeeding but she does not plan to continue postpartum. Candise Che, RN

## 2011-07-31 NOTE — Anesthesia Preprocedure Evaluation (Addendum)
Anesthesia Evaluation  Name, MR# and DOB Patient awake  General Assessment Comment  Reviewed: Allergy & Precautions, H&P , NPO status , Patient's Chart, lab work & pertinent test results  Airway Mallampati: II TM Distance: >3 FB Neck ROM: full    Dental No notable dental hx.    Pulmonary    Pulmonary exam normal       Cardiovascular     Neuro/Psych Negative Psych ROS   GI/Hepatic negative GI ROS Neg liver ROS    Endo/Other  Negative Endocrine ROS  Renal/GU negative Renal ROS  Genitourinary negative   Musculoskeletal   Abdominal Normal abdominal exam  (+)   Peds negative pediatric ROS (+)  Hematology negative hematology ROS (+)   Anesthesia Other Findings   Reproductive/Obstetrics (+) Pregnancy                           Anesthesia Physical Anesthesia Plan  ASA: II  Anesthesia Plan: Epidural   Post-op Pain Management:    Induction:   Airway Management Planned:   Additional Equipment:   Intra-op Plan:   Post-operative Plan:   Informed Consent: I have reviewed the patients History and Physical, chart, labs and discussed the procedure including the risks, benefits and alternatives for the proposed anesthesia with the patient or authorized representative who has indicated his/her understanding and acceptance.     Plan Discussed with:   Anesthesia Plan Comments:         Anesthesia Quick Evaluation

## 2011-08-01 LAB — CBC
Platelets: 131 10*3/uL — ABNORMAL LOW (ref 150–400)
RBC: 4.32 MIL/uL (ref 3.87–5.11)
RDW: 13.9 % (ref 11.5–15.5)
WBC: 11.2 10*3/uL — ABNORMAL HIGH (ref 4.0–10.5)

## 2011-08-01 NOTE — Progress Notes (Signed)
UR Chart review completed.  

## 2011-08-01 NOTE — Anesthesia Postprocedure Evaluation (Signed)
Anesthesia Post Note  Patient: [name]  Procedure(s) Performed: * No procedures listed *  Anesthesia type: Epidural  Patient location: Mother/Baby  Post pain: Pain level controlled  Post assessment: Post-op Vital signs reviewed  Last Vitals:  Filed Vitals:   08/01/11 1456  BP: 117/73  Pulse: 73  Temp: 98.5 F (36.9 C)  Resp: 18    Post vital signs: Reviewed  Level of consciousness: awake  Complications: No apparent anesthesia complications

## 2011-08-01 NOTE — Progress Notes (Signed)
  Postpartum day one Fundus firm Lochia moderate Legs negative No complaints 

## 2011-08-01 NOTE — Anesthesia Postprocedure Evaluation (Signed)
  Anesthesia Post-op Note  Patient: [name]  Procedure(s) Performed: * No procedures listed *  Patient Location: Mother/Baby  Anesthesia Type: Epidural  Level of Consciousness: awake and alert   Airway and Oxygen Therapy: Patient Spontanous Breathing  Post-op Pain: none  Post-op Assessment: Patient's Cardiovascular Status Stable, Respiratory Function Stable, No signs of Nausea or vomiting, Adequate PO intake and Pain level controlled  Post-op Vital Signs: Reviewed and stable  Complications: No apparent anesthesia complications

## 2011-08-01 NOTE — Plan of Care (Signed)
Problem: Discharge Progression Outcomes Goal: MMR given as ordered Outcome: Not Applicable Date Met:  08/01/11 Refused by pt - rubella-nonimmune

## 2011-08-02 MED ORDER — IBUPROFEN 600 MG PO TABS: 600.0000 mg | ORAL_TABLET | Freq: Four times a day (QID) | ORAL | Status: AC

## 2011-08-02 MED ORDER — NORETHIN ACE-ETH ESTRAD-FE 1-20 MG-MCG(24) PO TABS: 1.0000 | ORAL_TABLET | ORAL | Status: DC

## 2011-08-02 NOTE — Discharge Summary (Signed)
  Obstetric Discharge Summary Reason for Admission: onset of labor Prenatal Procedures: none Intrapartum Procedures: spontaneous vaginal delivery Postpartum Procedures: none Complications-Operative and Postpartum: none  Hemoglobin  Date Value Range Status  08/01/2011 10.3* 12.0-15.0 (g/dL) Final     HCT  Date Value Range Status  08/01/2011 31.3* 36.0-46.0 (%) Final    Discharge Diagnoses: Term Pregnancy-delivered  Discharge Information: Date: 08/02/2011 Activity: pelvic rest Diet: routine Medications: Ibuprofen, Loestrin 24 Fe Condition: stable Instructions: refer to routine discharge instructions Discharge to: home Follow-up Information    Follow up with MARSHALL,BERNARD A, MD. Call in 6 weeks.   Contact information:   [address] [place] [address] [phone]          Newborn Data: Live born  Information for the patient's newborn:  Shardee, Dieu [[id number]]  female ; APGAR , ; weight ;  Home with mother.  JACKSON-MOORE,Celesta Funderburk A 08/02/2011, 10:39 AM

## 2011-08-02 NOTE — Progress Notes (Signed)
Post Partum Day #2 S/P:spontaneous vaginal  RH status/Rubella reviewed.  Feeding: bottle. Subjective: No HA, SOB, CP, F/C, breast symptoms: No:. Normal vaginal bleeding, no clots.     Objective:  Blood pressure 116/75, pulse 76, temperature 98.1 F (36.7 C), temperature source Oral, resp. rate 18, height 5\' 8"  (1.727 m), weight 91.627 kg (202 lb), SpO2 100.00%, unknown if currently breastfeeding.   Physical Exam:  General: alert Lochia: appropriate Uterine Fundus: firm DVT Evaluation: No evidence of DVT seen on physical exam. Ext: No c/c/e  Basename 08/01/11 0525 07/31/11 0455  HGB 10.3* 12.0  HCT 31.3* 36.2    Assessment/Plan: 20 y.o.  PPD # 2 .  normal postpartum exam patient is a candidate for oral contraceptives (estrogen/progesterone) for contraception, with no contraindications Continue current postpartum care D/C home   LOS: 2 days   JACKSON-MOORE,Jessamine Barcia A 08/02/2011, 10:33 AM

## 2011-08-05 ENCOUNTER — Inpatient Hospital Stay (HOSPITAL_COMMUNITY): Admission: RE | Admit: 2011-08-05 | Payer: Medicaid Other | Source: Ambulatory Visit

## 2011-08-31 ENCOUNTER — Emergency Department (HOSPITAL_COMMUNITY)
Admission: EM | Admit: 2011-08-31 | Discharge: 2011-08-31 | Disposition: A | Discharge status: Home / Self Care | Payer: Medicaid Other | Attending: Emergency Medicine | Admitting: Emergency Medicine

## 2011-08-31 ENCOUNTER — Encounter: Payer: Self-pay | Admitting: Emergency Medicine

## 2011-08-31 ENCOUNTER — Emergency Department (HOSPITAL_COMMUNITY)
Admission: EM | Admit: 2011-08-31 | Payer: Medicaid Other | Source: Home / Self Care | Attending: Emergency Medicine | Admitting: Emergency Medicine

## 2011-08-31 DIAGNOSIS — R569 Unspecified convulsions: Secondary | ICD-10-CM | POA: Insufficient documentation

## 2011-08-31 DIAGNOSIS — G40909 Epilepsy, unspecified, not intractable, without status epilepticus: Secondary | ICD-10-CM

## 2011-08-31 LAB — POCT I-STAT, CHEM 8
Chloride: 104 mEq/L (ref 96–112)
Creatinine, Ser: 0.8 mg/dL (ref 0.50–1.10)
Glucose, Bld: 105 mg/dL — ABNORMAL HIGH (ref 70–99)
Potassium: 4.2 mEq/L (ref 3.5–5.1)

## 2011-08-31 LAB — URINALYSIS, ROUTINE W REFLEX MICROSCOPIC
Bilirubin Urine: NEGATIVE
Hgb urine dipstick: NEGATIVE
Specific Gravity, Urine: 1.02 (ref 1.005–1.030)
Urobilinogen, UA: 1 mg/dL (ref 0.0–1.0)

## 2011-08-31 LAB — DIFFERENTIAL
Basophils Absolute: 0 10*3/uL (ref 0.0–0.1)
Lymphs Abs: 1 10*3/uL (ref 0.7–4.0)
Monocytes Relative: 3 % (ref 3–12)
Neutro Abs: 4.9 10*3/uL (ref 1.7–7.7)

## 2011-08-31 LAB — CBC
HCT: 38.9 % (ref 36.0–46.0)
MCH: 24 pg — ABNORMAL LOW (ref 26.0–34.0)
MCHC: 33.4 g/dL (ref 30.0–36.0)
MCV: 71.9 fL — ABNORMAL LOW (ref 78.0–100.0)
RDW: 13 % (ref 11.5–15.5)
WBC: 6.2 10*3/uL (ref 4.0–10.5)

## 2011-08-31 LAB — URINE MICROSCOPIC-ADD ON

## 2011-08-31 NOTE — ED Notes (Signed)
Family at bedside, able to provide additional information

## 2011-08-31 NOTE — ED Provider Notes (Signed)
History     CSN: [id number] Arrival date & time: 08/31/2011 10:15 AM   First MD Initiated Contact with Patient 08/31/11 1037   11:23 AM Patient is a 20 y.o. female presenting with seizures.  Seizures  This is a chronic problem. Episode onset: this morning. The problem has been gradually improving. There was 1 seizure. The most recent episode lasted more than 5 minutes. Associated symptoms include sleepiness. Pertinent negatives include no headaches, no neck stiffness, no chest pain, no nausea and no vomiting. Characteristics include rhythmic jerking. Characteristics do not include bowel incontinence or bladder incontinence. The episode was witnessed. The seizures did not continue in the ED. The seizure(s) had no focality. There has been no fever. There were no medications administered prior to arrival.   family reports to hurt her having a seizure and that this morning. Mother states seizure lasted approximately 10 minutes patient lives drowsy afterwards. Has a history of seizures. Recently began seeing Dr. Marjory Lies. States in February he discontinued it taking Dilantin 2 tooth side effects. Has not been placed on any other seizure medication. Last seizure was 2 months ago.  Past Medical History  Diagnosis Date  . Seizures     "converted seizures", treated at [facility]    History reviewed. No pertinent past surgical history.  No family history on file.  History  Substance Use Topics  . Smoking status: Not on file  . Smokeless tobacco: Not on file  . Alcohol Use:     OB History    Grav Para Term Preterm Abortions TAB SAB Ect Mult Living                  Review of Systems  Constitutional: Negative for fever and chills.  HENT: Negative for neck pain and neck stiffness.   Respiratory: Negative for shortness of breath.   Cardiovascular: Negative for chest pain.  Gastrointestinal: Negative for nausea, vomiting, abdominal pain and bowel incontinence.  Genitourinary: Negative for  bladder incontinence, dysuria and frequency.  Musculoskeletal: Negative for back pain.  Neurological: Positive for seizures. Negative for dizziness and headaches.    Allergies  Review of patient's allergies indicates no known allergies.  Home Medications   Current Outpatient Rx  Name Route Sig Dispense Refill  . IBUPROFEN 400 MG PO TABS Oral Take 400 mg by mouth every 6 (six) hours as needed.        BP 147/94  Pulse 66  Temp(Src) 98.7 F (37.1 C) (Oral)  Resp 19  SpO2 100%  Physical Exam  Vitals reviewed. Constitutional: Vital signs are normal. She appears well-developed and well-nourished. She appears lethargic.  HENT:  Head: Normocephalic and atraumatic.  Eyes: Conjunctivae are normal. Pupils are equal, round, and reactive to light.  Neck: Normal range of motion. Neck supple.  Cardiovascular: Normal rate, regular rhythm and normal heart sounds.  Exam reveals no friction rub.   No murmur heard. Pulmonary/Chest: Effort normal and breath sounds normal. She has no wheezes. She has no rhonchi. She has no rales. She exhibits no tenderness.  Musculoskeletal: Normal range of motion.  Neurological: She appears lethargic. A sensory deficit is present. GCS eye subscore is 3. GCS verbal subscore is 4. GCS motor subscore is 5.       Patient typically becomes drowsy s/p seizure according to mother. Will reassess after labs  Skin: Skin is warm and dry. No rash noted. No erythema. No pallor.    ED Course  Procedures  Results for orders placed during the hospital  encounter of 08/31/11  CBC      Component Value Range   WBC 6.2  4.0 - 10.5 (K/uL)   RBC 5.41 (*) 3.87 - 5.11 (MIL/uL)   Hemoglobin 13.0  12.0 - 15.0 (g/dL)   HCT 16.1  09.6 - 04.5 (%)   MCV 71.9 (*) 78.0 - 100.0 (fL)   MCH 24.0 (*) 26.0 - 34.0 (pg)   MCHC 33.4  30.0 - 36.0 (g/dL)   RDW 40.9  81.1 - 91.4 (%)   Platelets 164  150 - 400 (K/uL)  DIFFERENTIAL      Component Value Range   Neutrophils Relative 80 (*) 43 - 77  (%)   Lymphocytes Relative 16  12 - 46 (%)   Monocytes Relative 3  3 - 12 (%)   Eosinophils Relative 1  0 - 5 (%)   Basophils Relative 0  0 - 1 (%)   Neutro Abs 4.9  1.7 - 7.7 (K/uL)   Lymphs Abs 1.0  0.7 - 4.0 (K/uL)   Monocytes Absolute 0.2  0.1 - 1.0 (K/uL)   Eosinophils Absolute 0.1  0.0 - 0.7 (K/uL)   Basophils Absolute 0.0  0.0 - 0.1 (K/uL)   Smear Review MORPHOLOGY UNREMARKABLE    URINALYSIS, ROUTINE W REFLEX MICROSCOPIC      Component Value Range   Color, Urine YELLOW  YELLOW    Appearance CLEAR  CLEAR    Specific Gravity, Urine 1.020  1.005 - 1.030    pH 7.0  5.0 - 8.0    Glucose, UA NEGATIVE  NEGATIVE (mg/dL)   Hgb urine dipstick NEGATIVE  NEGATIVE    Bilirubin Urine NEGATIVE  NEGATIVE    Ketones, ur NEGATIVE  NEGATIVE (mg/dL)   Protein, ur NEGATIVE  NEGATIVE (mg/dL)   Urobilinogen, UA 1.0  0.0 - 1.0 (mg/dL)   Nitrite NEGATIVE  NEGATIVE    Leukocytes, UA TRACE (*) NEGATIVE   POCT I-STAT, CHEM 8      Component Value Range   Sodium 140  135 - 145 (mEq/L)   Potassium 4.2  3.5 - 5.1 (mEq/L)   Chloride 104  96 - 112 (mEq/L)   BUN 8  6 - 23 (mg/dL)   Creatinine, Ser 7.82  0.50 - 1.10 (mg/dL)   Glucose, Bld 956 (*) 70 - 99 (mg/dL)   Calcium, Ion 2.13  0.86 - 1.32 (mmol/L)   TCO2 24  0 - 100 (mmol/L)   Hemoglobin 15.0  12.0 - 15.0 (g/dL)   HCT 57.8  46.9 - 62.9 (%)  URINE MICROSCOPIC-ADD ON      Component Value Range   Squamous Epithelial / LPF RARE  RARE    WBC, UA 3-6  <3 (WBC/hpf)   Bacteria, UA MANY (*) RARE    Urine-Other MUCOUS PRESENT      Patient is back to normal mental status and is requesting a sandwich. Labs  WNL. Family and patient states they will f/u with Dr. Marjory Lies.   MDM          Thomasene Lot, PA 08/31/11 1320

## 2011-08-31 NOTE — ED Notes (Signed)
Pt a/o x 4, resting quietly, skin warm and dry, respirations even and unlabored, no acute distress noted, ambulating in room without difficulty. No seizure activity while in ER

## 2011-08-31 NOTE — ED Provider Notes (Signed)
Medical screening examination/treatment/procedure(s) were performed by non-physician practitioner and as supervising physician I was immediately available for consultation/collaboration.  Abram Sax R. Britini Garcilazo, MD 08/31/11 1625 

## 2011-08-31 NOTE — ED Notes (Signed)
Phlebotomy at bedside.

## 2011-08-31 NOTE — ED Notes (Signed)
Bed:WA14<BR> Expected date:<BR> Expected time:<BR> Means of arrival:<BR> Comments:<BR>

## 2011-08-31 NOTE — ED Notes (Signed)
Per EMS, per pt's family, pt had a seizure lasting 10 minutes, seizures don't happen often, last seizure 2 months ago, "long postical phase" per family, pt also c/o migraines for several days. Pt was responsive to painful stimuli upon EMS arrival, purposeful eye movement. Upon arrival to ER, pt eyes open spontaneously, drowsy , appears postical

## 2011-09-01 ENCOUNTER — Encounter (HOSPITAL_COMMUNITY): Payer: Self-pay | Admitting: *Deleted

## 2011-11-14 ENCOUNTER — Encounter (HOSPITAL_COMMUNITY): Payer: Self-pay | Admitting: *Deleted

## 2011-11-14 ENCOUNTER — Emergency Department (HOSPITAL_COMMUNITY)
Admission: EM | Admit: 2011-11-14 | Discharge: 2011-11-15 | Disposition: A | Discharge status: Home / Self Care | Payer: Medicaid Other | Attending: Emergency Medicine | Admitting: Emergency Medicine

## 2011-11-14 DIAGNOSIS — G40109 Localization-related (focal) (partial) symptomatic epilepsy and epileptic syndromes with simple partial seizures, not intractable, without status epilepticus: Secondary | ICD-10-CM | POA: Insufficient documentation

## 2011-11-14 MED ORDER — ONDANSETRON HCL 4 MG/2ML IJ SOLN
4.0000 mg | Freq: Once | INTRAMUSCULAR | Status: AC
  Administered 2011-11-14: 4 mg via INTRAVENOUS
  Filled 2011-11-14: qty 2

## 2011-11-14 MED ORDER — SODIUM CHLORIDE 0.9 % IV BOLUS (SEPSIS)
250.0000 mL | Freq: Once | INTRAVENOUS | Status: AC
  Administered 2011-11-14: via INTRAVENOUS

## 2011-11-14 NOTE — ED Notes (Signed)
Pt noted to have a seizure at home as was witnessed by her mother.  Pt has a hx of seizures during pregnancy but none otherwise.

## 2011-11-14 NOTE — ED Notes (Signed)
Pt comes from home via [facility] where she was witnessed to have seizure activity by her mother.

## 2011-11-14 NOTE — ED Provider Notes (Signed)
History     CSN: [id number]  Arrival date & time 11/14/11  2331   First MD Initiated Contact with Patient 11/14/11 2334      Chief Complaint  Patient presents with  . Seizures    s/p seizure    (Consider location/radiation/quality/duration/timing/severity/associated sxs/prior treatment) HPI Comments: Patient was first noted to have seizures during her pregnancy, which ended in November 2012.  Tonight.  Mother reports that she had a 45 minute period where she had repeated episodes of staring that would last anywhere from 30 seconds to a couple minutes and then she had one episode of vomiting and is concerned.  Mom, so she called EMS for transport for further evaluation  Patient is a 21 y.o. female presenting with seizures. The history is provided by the patient.  Seizures  This is a recurrent problem. There were 4 to 5 seizures. The most recent episode lasted less than 30 seconds. Pertinent negatives include no cough. Characteristics include eye blinking. Characteristics do not include bowel incontinence, bladder incontinence, rhythmic jerking or loss of consciousness.    Past Medical History  Diagnosis Date  . Seizures   . Seizures     "converted seizures", treated at [facility]    Past Surgical History  Procedure Date  . None   . Wisdom tooth extraction     History reviewed. No pertinent family history.  History  Substance Use Topics  . Smoking status: Not on file  . Smokeless tobacco: Not on file  . Alcohol Use: No    OB History    Grav Para Term Preterm Abortions TAB SAB Ect Mult Living   2 2 2  0 0 0 0 0 0 2      Review of Systems  Constitutional: Negative for fever and chills.  HENT: Negative for congestion.   Respiratory: Negative for cough.   Gastrointestinal: Negative for bowel incontinence.  Genitourinary: Negative for bladder incontinence and dysuria.  Neurological: Positive for seizures. Negative for dizziness, loss of consciousness and weakness.     Allergies  Review of patient's allergies indicates no known allergies.  Home Medications   Current Outpatient Rx  Name Route Sig Dispense Refill  . IBUPROFEN 400 MG PO TABS Oral Take 600 mg by mouth every 6 (six) hours as needed. For pain    . MEDROXYPROGESTERONE ACETATE 150 MG/ML IM SUSP Intramuscular Inject 150 mg into the muscle every 3 (three) months.      BP 107/53  Pulse 75  SpO2 100%  Physical Exam  Constitutional: She appears well-developed.  HENT:  Head: Normocephalic.  Eyes: Pupils are equal, round, and reactive to light.  Neck: Normal range of motion.  Pulmonary/Chest: Effort normal.  Musculoskeletal: Normal range of motion.  Neurological: She is alert.       Communicates slow to respond, but is appropriate  Skin: Skin is warm and dry.    ED Course  Procedures (including critical care time)  Labs Reviewed  CBC - Abnormal; Notable for the following:    HCT 35.7 (*)    MCV 70.7 (*)    MCH 23.8 (*)    All other components within normal limits  POCT I-STAT, CHEM 8 - Abnormal; Notable for the following:    Glucose, Bld 102 (*)    All other components within normal limits  DIFFERENTIAL  URINE RAPID DRUG SCREEN (HOSP PERFORMED)  POCT PREGNANCY, URINE  I-STAT, CHEM 8  POCT PREGNANCY, URINE   No results found.   1. Seizure disorder, simple  partial       MDM  Focal seizures Review of labs shows that she has a normal white count, normal electrolytes.  She's not pregnant.  Has no drugs within her system.  We'll continue to monitor patient for further  seizure, activity Patient has had no further seizure activity.  Family member at the bedside said that she has been evaluated by our local neurologist, as well as at [facility], no definitive cause for her seizures has been identified, and she has not been placed on any medication      Arman Filter, NP 11/14/11 2351  Arman Filter, NP 11/15/11 0250  Medical screening  examination/treatment/procedure(s) were performed by non-physician practitioner and as supervising physician I was immediately available for consultation/collaboration.   Sunnie Nielsen, MD 11/15/11 [phone]

## 2011-11-15 LAB — CBC
HCT: 35.7 % — ABNORMAL LOW (ref 36.0–46.0)
Hemoglobin: 12 g/dL (ref 12.0–15.0)
RBC: 5.05 MIL/uL (ref 3.87–5.11)
WBC: 8 10*3/uL (ref 4.0–10.5)

## 2011-11-15 LAB — DIFFERENTIAL
Band Neutrophils: 0 % (ref 0–10)
Basophils Absolute: 0 K/uL (ref 0.0–0.1)
Basophils Relative: 0 % (ref 0–1)
Blasts: 0 %
Eosinophils Absolute: 0.2 K/uL (ref 0.0–0.7)
Eosinophils Relative: 2 % (ref 0–5)
Lymphocytes Relative: 37 % (ref 12–46)
Lymphs Abs: 3 K/uL (ref 0.7–4.0)
Metamyelocytes Relative: 0 %
Monocytes Absolute: 0.6 K/uL (ref 0.1–1.0)
Monocytes Relative: 7 % (ref 3–12)
Myelocytes: 0 %
Neutro Abs: 4.2 K/uL (ref 1.7–7.7)
Neutrophils Relative %: 54 % (ref 43–77)
Promyelocytes Absolute: 0 %
nRBC: 0 /100{WBCs}

## 2011-11-15 LAB — RAPID URINE DRUG SCREEN, HOSP PERFORMED
Benzodiazepines: NOT DETECTED
Cocaine: NOT DETECTED
Opiates: NOT DETECTED

## 2011-11-15 LAB — POCT I-STAT, CHEM 8
BUN: 9 mg/dL (ref 6–23)
Creatinine, Ser: 0.8 mg/dL (ref 0.50–1.10)
Sodium: 143 mEq/L (ref 135–145)
TCO2: 23 mmol/L (ref 0–100)

## 2012-01-02 ENCOUNTER — Encounter (HOSPITAL_COMMUNITY): Payer: Self-pay

## 2012-01-02 ENCOUNTER — Emergency Department (HOSPITAL_COMMUNITY)
Admission: EM | Admit: 2012-01-02 | Discharge: 2012-01-02 | Disposition: A | Discharge status: Home / Self Care | Payer: 59 | Source: Home / Self Care | Attending: Emergency Medicine | Admitting: Emergency Medicine

## 2012-01-02 DIAGNOSIS — K5289 Other specified noninfective gastroenteritis and colitis: Secondary | ICD-10-CM

## 2012-01-02 DIAGNOSIS — B349 Viral infection, unspecified: Secondary | ICD-10-CM

## 2012-01-02 DIAGNOSIS — B9789 Other viral agents as the cause of diseases classified elsewhere: Secondary | ICD-10-CM

## 2012-01-02 DIAGNOSIS — K529 Noninfective gastroenteritis and colitis, unspecified: Secondary | ICD-10-CM

## 2012-01-02 MED ORDER — LOPERAMIDE HCL 2 MG PO CAPS: 2.0000 mg | ORAL_CAPSULE | Freq: Four times a day (QID) | ORAL | Status: AC | PRN

## 2012-01-02 MED ORDER — ONDANSETRON HCL 4 MG PO TABS: 4.0000 mg | ORAL_TABLET | Freq: Three times a day (TID) | ORAL | Status: AC | PRN

## 2012-01-02 NOTE — ED Provider Notes (Signed)
History     CSN: [id number]  Arrival date & time 01/02/12  0844   First MD Initiated Contact with Patient 01/02/12 [phone]      Chief Complaint  Patient presents with  . Emesis  . Diarrhea  . URI    (Consider location/radiation/quality/duration/timing/severity/associated sxs/prior treatment) HPI Comments: [name] presents for evaluation of persistent nausea, vomiting, diarrhea, body aches, and sore throat since last weekend. She reports vomiting several times per day. She also reports several episodes of watery diarrhea with the last episode being yesterday. She continues to tolerate food and liquids by mouth well. However, she says, "it comes back up later." She also reports abdominal soreness from retching, and diarrhea. 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  Patient is a 21 y.o. female presenting with vomiting, diarrhea, and URI. The history is provided by the patient.  Emesis  This is a new problem. The current episode started more than 2 days ago. The problem occurs 2 to 4 times per day. The problem has not changed since onset.There has been no fever. Associated symptoms include abdominal pain, cough, diarrhea and URI.  Diarrhea The primary symptoms include abdominal pain, nausea, vomiting and diarrhea.  URI The primary symptoms include sore throat, cough, abdominal pain, nausea and vomiting. The current episode started 3 to 5 days ago. This is a new problem. The problem has not changed since onset. The vomiting began more than 2 days ago. Vomiting occurs 2 to 5 times per day.  The illness is not associated with congestion or rhinorrhea.    Past Medical History  Diagnosis Date  . Seizures   . Seizures     "converted seizures", treated at [facility]    Past Surgical History  Procedure Date  . None   . Wisdom tooth extraction     No family history on file.  History  Substance Use Topics  . Smoking status:  Never Smoker   . Smokeless tobacco: Not on file  . Alcohol Use: No    OB History    Grav Para Term Preterm Abortions TAB SAB Ect Mult Living   2 2 2  0 0 0 0 0 0 2      Review of Systems  Constitutional: Negative.   HENT: Positive for sore throat. Negative for congestion and rhinorrhea.   Eyes: Negative.   Respiratory: Positive for cough.   Cardiovascular: Negative.   Gastrointestinal: Positive for nausea, vomiting, abdominal pain and diarrhea.  Genitourinary: Negative.   Musculoskeletal: Negative.   Skin: Negative.   Neurological: Negative.     Allergies  Review of patient's allergies indicates no known allergies.  Home Medications   Current Outpatient Rx  Name Route Sig Dispense Refill  . IBUPROFEN 400 MG PO TABS Oral Take 600 mg by mouth every 6 (six) hours as needed. For pain    . MEDROXYPROGESTERONE ACETATE 150 MG/ML IM SUSP Intramuscular Inject 150 mg into the muscle every 3 (three) months.      BP 124/77  Pulse 61  Temp(Src) 99.2 F (37.3 C) (Oral)  Resp 16  SpO2 100%  Physical Exam  Nursing note and vitals reviewed. Constitutional: She is oriented to person, place, and time. She appears well-developed and well-nourished.  HENT:  Head: Normocephalic and atraumatic.  Right Ear: Tympanic membrane normal.  Left Ear: Tympanic membrane normal.  Mouth/Throat: Uvula is midline, oropharynx is clear and moist and mucous membranes are normal.  Eyes: EOM  are normal.  Neck: Normal range of motion and full passive range of motion without pain.  Cardiovascular: Normal rate, regular rhythm and normal heart sounds.   No murmur heard. Pulmonary/Chest: Effort normal and breath sounds normal. She has no decreased breath sounds. She has no wheezes. She has no rhonchi.  Abdominal: Soft. Normal appearance and bowel sounds are normal. There is generalized tenderness.  Musculoskeletal: Normal range of motion.  Neurological: She is alert and oriented to person, place, and time.    Skin: Skin is warm and dry.  Psychiatric: Her behavior is normal.    ED Course  Procedures (including critical care time)  Labs Reviewed - No data to display No results found.   No diagnosis found.    MDM  Likely viral gastroenteritis; given rx for ondansetron and loperamide; return to care should sx worsen        Renaee Munda, MD 01/02/12 1050

## 2012-01-02 NOTE — ED Notes (Signed)
C/o not feeling well since this weekend.  Reports vomiting, diarrhea, cough, sore throat and body aches.  States sx worse since Tuesday.  Last vomited early this am and last had diarrhea yesterday. Denies fever.

## 2012-01-02 NOTE — Discharge Instructions (Signed)
Take medications (ondansetron for nausea, and loperamide for diarrhea) as instructed. Consume clear liquids such as water, Sprite, gingerale, light juices for the next 12 to 24 hours, then advance as tolerated to [employer] (bananas, rice, applesauce, toast) and bland things such as soup, crackers, etc. Stop taking medications and return if any blood in stool or any fevers, or you are unable to eat or drink.

## 2012-01-27 ENCOUNTER — Other Ambulatory Visit: Payer: Self-pay | Admitting: Obstetrics

## 2012-12-31 ENCOUNTER — Ambulatory Visit: Admit: 2012-12-31 | Payer: Medicaid Other | Admitting: Obstetrics & Gynecology

## 2012-12-31 SURGERY — LIGATION, FALLOPIAN TUBE, LAPAROSCOPIC
Anesthesia: General | Laterality: Bilateral

## 2013-01-23 ENCOUNTER — Encounter (HOSPITAL_COMMUNITY): Payer: Self-pay | Admitting: Emergency Medicine

## 2013-01-23 ENCOUNTER — Emergency Department (HOSPITAL_COMMUNITY)
Admission: EM | Admit: 2013-01-23 | Discharge: 2013-01-23 | Disposition: A | Discharge status: Home / Self Care | Payer: Medicaid Other | Attending: Emergency Medicine | Admitting: Emergency Medicine

## 2013-01-23 ENCOUNTER — Emergency Department (HOSPITAL_COMMUNITY): Admission: EM | Admit: 2013-01-23 | Payer: Medicaid Other | Source: Home / Self Care

## 2013-01-23 DIAGNOSIS — Z3202 Encounter for pregnancy test, result negative: Secondary | ICD-10-CM | POA: Insufficient documentation

## 2013-01-23 DIAGNOSIS — R569 Unspecified convulsions: Secondary | ICD-10-CM

## 2013-01-23 DIAGNOSIS — G40909 Epilepsy, unspecified, not intractable, without status epilepticus: Secondary | ICD-10-CM | POA: Insufficient documentation

## 2013-01-23 LAB — POCT I-STAT, CHEM 8
BUN: 7 mg/dL (ref 6–23)
Calcium, Ion: 1.04 mmol/L — ABNORMAL LOW (ref 1.12–1.23)
Creatinine, Ser: 0.7 mg/dL (ref 0.50–1.10)
Glucose, Bld: 96 mg/dL (ref 70–99)
TCO2: 21 mmol/L (ref 0–100)

## 2013-01-23 LAB — RAPID URINE DRUG SCREEN, HOSP PERFORMED
Barbiturates: NOT DETECTED
Benzodiazepines: NOT DETECTED
Cocaine: NOT DETECTED

## 2013-01-23 LAB — PREGNANCY, URINE: Preg Test, Ur: NEGATIVE

## 2013-01-23 LAB — URINALYSIS, ROUTINE W REFLEX MICROSCOPIC
Bilirubin Urine: NEGATIVE
Nitrite: NEGATIVE
Specific Gravity, Urine: 1.023 (ref 1.005–1.030)
pH: 5.5 (ref 5.0–8.0)
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
## 2013-01-23 NOTE — ED Notes (Signed)
Per EMS family witness 7 min seizure, positive LOC. Pt alert and orient at this time. PIV placed by EMS.

## 2013-01-24 NOTE — ED Provider Notes (Signed)
Medical screening examination/treatment/procedure(s) were performed by non-physician practitioner and as supervising physician I was immediately available for consultation/collaboration.   Loren Racer, MD 01/24/13 0530

## 2013-03-03 DIAGNOSIS — Z0289 Encounter for other administrative examinations: Secondary | ICD-10-CM

## 2013-04-04 ENCOUNTER — Emergency Department (HOSPITAL_COMMUNITY): Payer: Medicaid Other

## 2013-04-04 ENCOUNTER — Encounter (HOSPITAL_COMMUNITY): Payer: Self-pay | Admitting: *Deleted

## 2013-04-04 ENCOUNTER — Emergency Department (HOSPITAL_COMMUNITY)
Admission: EM | Admit: 2013-04-04 | Discharge: 2013-04-04 | Disposition: A | Discharge status: Home / Self Care | Payer: Medicaid Other | Attending: Emergency Medicine | Admitting: Emergency Medicine

## 2013-04-04 DIAGNOSIS — Y9389 Activity, other specified: Secondary | ICD-10-CM | POA: Insufficient documentation

## 2013-04-04 DIAGNOSIS — Z8669 Personal history of other diseases of the nervous system and sense organs: Secondary | ICD-10-CM | POA: Insufficient documentation

## 2013-04-04 DIAGNOSIS — S62112A Displaced fracture of triquetrum [cuneiform] bone, left wrist, initial encounter for closed fracture: Secondary | ICD-10-CM

## 2013-04-04 DIAGNOSIS — X500XXA Overexertion from strenuous movement or load, initial encounter: Secondary | ICD-10-CM | POA: Insufficient documentation

## 2013-04-04 DIAGNOSIS — R209 Unspecified disturbances of skin sensation: Secondary | ICD-10-CM | POA: Insufficient documentation

## 2013-04-04 DIAGNOSIS — S62113A Displaced fracture of triquetrum [cuneiform] bone, unspecified wrist, initial encounter for closed fracture: Secondary | ICD-10-CM | POA: Insufficient documentation

## 2013-04-04 DIAGNOSIS — Y9289 Other specified places as the place of occurrence of the external cause: Secondary | ICD-10-CM | POA: Insufficient documentation

## 2013-04-04 MED ORDER — OXYCODONE-ACETAMINOPHEN 5-325 MG PO TABS: 1.0000 | ORAL_TABLET | ORAL | Status: DC | PRN

## 2013-04-04 MED ORDER — OXYCODONE-ACETAMINOPHEN 5-325 MG PO TABS: 1.0000 | ORAL_TABLET | Freq: Once | ORAL | Status: DC

## 2013-04-04 NOTE — ED Notes (Signed)
Ortho tech called and will be here to apply splint

## 2013-04-04 NOTE — ED Provider Notes (Signed)
History     CSN: [id number]  Arrival date & time 04/04/13  1815   First MD Initiated Contact with Patient 04/04/13 1932      Chief Complaint  Patient presents with  . Wrist Pain    (Consider location/radiation/quality/duration/timing/severity/associated sxs/prior treatment) HPI  Patient is a 22 year old female presenting to the emergency department for left hand and wrist non-radiating pain that occurred after putting pressure on left hand to get out of bed. Pt noted immediate swelling and nonradiating pain in the left wrist after injury. Notes some numbness in thenar eminence with decreased range of motion and thumb. Rates pain 10/10. Alleviating factors include ice. Aggravating factors include movement.   Past Medical History  Diagnosis Date  . Seizures   . Seizures     "converted seizures", treated at [facility]     Past Surgical History  Procedure Laterality Date  . None    . Wisdom tooth extraction      History reviewed. No pertinent family history.  History  Substance Use Topics  . Smoking status: Never Smoker   . Smokeless tobacco: Not on file  . Alcohol Use: No    OB History   Grav Para Term Preterm Abortions TAB SAB Ect Mult Living   2 2 2  0 0 0 0 0 0 2      Review of Systems  Constitutional: Negative for fever and chills.  Musculoskeletal: Positive for joint swelling.  Skin: Positive for color change.    Allergies  Review of patient's allergies indicates no known allergies.  Home Medications   Current Outpatient Rx  Name  Route  Sig  Dispense  Refill  . medroxyPROGESTERone (DEPO-PROVERA) 150 MG/ML injection   Intramuscular   Inject 150 mg into the muscle every 3 (three) months.           BP 150/71  Pulse 80  Temp(Src) 99.3 F (37.4 C) (Oral)  Resp 18  SpO2 98%  LMP 02/06/2013  Physical Exam  Constitutional: She is oriented to person, place, and time. She appears well-developed and well-nourished. No distress.  HENT:  Head:  Normocephalic and atraumatic.  Eyes: Conjunctivae are normal.  Neck: Neck supple.  Cardiovascular:  Pulses:      Radial pulses are 2+ on the right side, and 2+ on the left side.  Musculoskeletal:       Left wrist: She exhibits decreased range of motion, tenderness, bony tenderness, swelling and deformity. She exhibits no laceration.  Neurological: She is alert and oriented to person, place, and time.  Skin: Skin is warm and dry. She is not diaphoretic.  Psychiatric: She has a normal mood and affect.    ED Course  Procedures (including critical care time)  Labs Reviewed - No data to display Dg Wrist Complete Left  04/04/2013   *RADIOLOGY REPORT*  Clinical Data: Fall today.  Wrist pain.  LEFT WRIST - COMPLETE 3+ VIEW  Comparison: None.  Findings: The carpals are located.  On the lateral view, there is prominent soft tissue swelling dorsal to the carpals.  There is a linear small bony fragment seen posteriorly at the level the carpals on the lateral view, suspicious for an acute triquetral fracture.  The distal radius and distal ulna and remainder of the carpals are intact.  IMPRESSION: Findings  consistent with an acute triquetral fracture with prominent adjacent soft tissue swelling.   Original Report Authenticated By: Britta Mccreedy, M.D.     1. Triquetral fracture, left, closed, initial encounter  MDM  Patient with obvious deformity of left distal ulnar region. Neurovascularly intact. X-ray shows fracture. Pain managed in ED. Wrist/hand splint w/ volar splint. Hand surgeon follow up advised as soon as possible for further evaluation. Patient is agreeable to plan. Patient is stable at time of discharge          Jeannetta Ellis, PA-C 04/05/13 0112

## 2013-04-04 NOTE — Progress Notes (Signed)
Orthopedic Tech Progress Note Patient Details:  [name] 1990-12-22 [id number]  Ortho Devices Type of Ortho Device: Ace wrap;Volar splint Ortho Device/Splint Location: left hamd Ortho Device/Splint Interventions: Application   [name], [name] 04/04/2013, 9:03 PM

## 2013-04-04 NOTE — ED Notes (Signed)
Patient stated she was getting up from the bed this morning, pushed herself up with her left wrist and felt it pop.  Ice pack given

## 2013-04-04 NOTE — ED Notes (Signed)
Pt reports putting pressure on left hand/wrist and now having pain/swelling to wrist. +left radial.

## 2013-04-04 NOTE — ED Notes (Signed)
Patient transported to X-ray 

## 2013-04-07 NOTE — ED Provider Notes (Signed)
Medical screening examination/treatment/procedure(s) were performed by non-physician practitioner and as supervising physician I was immediately available for consultation/collaboration.   Samuel Rittenhouse, MD 04/07/13 0713 

## 2013-06-08 ENCOUNTER — Encounter (HOSPITAL_COMMUNITY): Payer: Self-pay | Admitting: Emergency Medicine

## 2013-06-08 ENCOUNTER — Emergency Department (HOSPITAL_COMMUNITY)
Admission: EM | Admit: 2013-06-08 | Discharge: 2013-06-08 | Disposition: A | Discharge status: Home / Self Care | Payer: Medicaid Other | Attending: Emergency Medicine | Admitting: Emergency Medicine

## 2013-06-08 DIAGNOSIS — Z8669 Personal history of other diseases of the nervous system and sense organs: Secondary | ICD-10-CM | POA: Insufficient documentation

## 2013-06-08 DIAGNOSIS — M62838 Other muscle spasm: Secondary | ICD-10-CM

## 2013-06-08 DIAGNOSIS — Z791 Long term (current) use of non-steroidal anti-inflammatories (NSAID): Secondary | ICD-10-CM | POA: Insufficient documentation

## 2013-06-08 DIAGNOSIS — M5380 Other specified dorsopathies, site unspecified: Secondary | ICD-10-CM | POA: Insufficient documentation

## 2013-06-08 MED ORDER — IBUPROFEN 800 MG PO TABS: 800.0000 mg | ORAL_TABLET | Freq: Three times a day (TID) | ORAL | Status: DC

## 2013-06-08 MED ORDER — CYCLOBENZAPRINE HCL 10 MG PO TABS: 10.0000 mg | ORAL_TABLET | Freq: Two times a day (BID) | ORAL | Status: DC | PRN

## 2013-06-08 MED ORDER — CYCLOBENZAPRINE HCL 10 MG PO TABS
10.0000 mg | ORAL_TABLET | Freq: Once | ORAL | Status: AC
  Administered 2013-06-08: 10 mg via ORAL
  Filled 2013-06-08: qty 1

## 2013-06-08 NOTE — ED Notes (Signed)
Pt escorted to discharge window. Pt verbalized understanding discharge instructions. In no acute distress.  

## 2013-06-08 NOTE — ED Provider Notes (Signed)
CSN: [id number]     Arrival date & time 06/08/13  1018 History     First MD Initiated Contact with Patient 06/08/13 1032     Chief Complaint  Patient presents with  . Neck Pain   (Consider location/radiation/quality/duration/timing/severity/associated sxs/prior Treatment) HPI Comments: Patient is a 22 year old female presenting to the emergency department for 2 years of mild to moderate right-sided neck pain. Describes the pain as a "tight knot" worse in the morning and better after some light stretching. Patient has not tried any over-the-counter medication for symptomatic care for her pain. She is not talk to her primary care doctor about this. No other aggravating factors. Patient does not remember any injury to her neck prior to the onset of her neck pain. Denies fevers, chills, numbness or weakness, visual disturbance, headache.  Patient is a 22 y.o. female presenting with neck pain.  Neck Pain Associated symptoms: no chest pain, no fever and no headaches     Past Medical History  Diagnosis Date  . Seizures   . Seizures     "converted seizures", treated at [facility]   Past Surgical History  Procedure Laterality Date  . None    . Wisdom tooth extraction     No family history on file. History  Substance Use Topics  . Smoking status: Never Smoker   . Smokeless tobacco: Not on file  . Alcohol Use: No   OB History   Grav Para Term Preterm Abortions TAB SAB Ect Mult Living   2 2 2  0 0 0 0 0 0 2     Review of Systems  Constitutional: Negative for fever and chills.  HENT: Positive for neck pain.   Eyes: Negative for visual disturbance.  Respiratory: Negative for shortness of breath.   Cardiovascular: Negative for chest pain.  Neurological: Negative for syncope and headaches.    Allergies  Review of patient's allergies indicates no known allergies.  Home Medications   Current Outpatient Rx  Name  Route  Sig  Dispense  Refill  . medroxyPROGESTERone (DEPO-PROVERA) 150  MG/ML injection   Intramuscular   Inject 150 mg into the muscle every 3 (three) months.         . cyclobenzaprine (FLEXERIL) 10 MG tablet   Oral   Take 1 tablet (10 mg total) by mouth 2 (two) times daily as needed for muscle spasms.   20 tablet   0   . ibuprofen (ADVIL,MOTRIN) 800 MG tablet   Oral   Take 1 tablet (800 mg total) by mouth 3 (three) times daily.   21 tablet   0    BP 109/70  Pulse 74  Temp(Src) 98.4 F (36.9 C) (Oral)  Resp 20  SpO2 100% Physical Exam  Constitutional: She is oriented to person, place, and time. She appears well-developed and well-nourished. No distress.  HENT:  Head: Normocephalic and atraumatic.  Mouth/Throat: Oropharynx is clear and moist.  Eyes: Conjunctivae and EOM are normal.  Neck: Normal range of motion and full passive range of motion without pain. Neck supple. No spinous process tenderness present. No rigidity. No edema and no erythema present.    Cardiovascular: Normal rate, regular rhythm and normal heart sounds.   Pulmonary/Chest: Effort normal and breath sounds normal.  Lymphadenopathy:    She has no cervical adenopathy.  Neurological: She is alert and oriented to person, place, and time. She has normal strength. No cranial nerve deficit or sensory deficit. Gait normal. GCS eye subscore is 4. GCS verbal subscore  is 5. GCS motor subscore is 6.  Skin: Skin is warm and dry. She is not diaphoretic.  Psychiatric: She has a normal mood and affect.    ED Course   Procedures (including critical care time)  Labs Reviewed - No data to display No results found. 1. Muscle spasms of neck     MDM  Patient afebrile, AAO x4, NAD, nontoxic appearing. Neurovascularly intact with full range of motion of neck intact. Source of pain likely musculoskeletal. Given the length of pain and no mechanism of injury or fall no imaging indicated at this time. Advised PCP follow up. Muscle relaxant and Motrin prescribed for muscle spasm. Return  precautions discussed. Patient agreeable to plan. Patient stable at time of discharge.   Jeannetta Ellis, PA-C 06/08/13 1511

## 2013-06-08 NOTE — ED Provider Notes (Signed)
Medical screening examination/treatment/procedure(s) were performed by non-physician practitioner and as supervising physician I was immediately available for consultation/collaboration.    Celene Kras, MD 06/08/13 [phone]

## 2013-06-08 NOTE — ED Notes (Signed)
Pt c/o neck pain x2 years.  

## 2013-06-13 ENCOUNTER — Ambulatory Visit
Admission: RE | Admit: 2013-06-13 | Discharge: 2013-06-13 | Disposition: A | Discharge status: Home / Self Care | Payer: Medicaid Other | Source: Ambulatory Visit | Attending: Family | Admitting: Family

## 2013-06-13 ENCOUNTER — Other Ambulatory Visit: Payer: Self-pay | Admitting: Family

## 2013-06-13 DIAGNOSIS — D179 Benign lipomatous neoplasm, unspecified: Secondary | ICD-10-CM

## 2013-06-13 DIAGNOSIS — M542 Cervicalgia: Secondary | ICD-10-CM

## 2013-07-06 ENCOUNTER — Encounter (HOSPITAL_COMMUNITY): Payer: Self-pay | Admitting: Emergency Medicine

## 2013-07-06 ENCOUNTER — Emergency Department (HOSPITAL_COMMUNITY)
Admission: EM | Admit: 2013-07-06 | Discharge: 2013-07-06 | Disposition: A | Discharge status: Home / Self Care | Payer: Medicaid Other | Attending: Emergency Medicine | Admitting: Emergency Medicine

## 2013-07-06 ENCOUNTER — Emergency Department (HOSPITAL_COMMUNITY): Payer: Medicaid Other

## 2013-07-06 DIAGNOSIS — Z3202 Encounter for pregnancy test, result negative: Secondary | ICD-10-CM | POA: Insufficient documentation

## 2013-07-06 DIAGNOSIS — R569 Unspecified convulsions: Secondary | ICD-10-CM | POA: Insufficient documentation

## 2013-07-06 LAB — CBC WITH DIFFERENTIAL/PLATELET
Basophils Absolute: 0.1 10*3/uL (ref 0.0–0.1)
Basophils Relative: 1 % (ref 0–1)
Eosinophils Absolute: 0.2 10*3/uL (ref 0.0–0.7)
Hemoglobin: 14.1 g/dL (ref 12.0–15.0)
Lymphocytes Relative: 35 % (ref 12–46)
MCH: 24.2 pg — ABNORMAL LOW (ref 26.0–34.0)
MCHC: 34.9 g/dL (ref 30.0–36.0)
Monocytes Absolute: 0.4 10*3/uL (ref 0.1–1.0)
Neutrophils Relative %: 55 % (ref 43–77)
Platelets: 205 10*3/uL (ref 150–400)
RBC: 5.82 MIL/uL — ABNORMAL HIGH (ref 3.87–5.11)

## 2013-07-06 LAB — BASIC METABOLIC PANEL
Calcium: 9.2 mg/dL (ref 8.4–10.5)
GFR calc Af Amer: >90 mL/min (ref >90)
GFR calc non Af Amer: >90 mL/min (ref >90)
Glucose, Bld: 98 mg/dL (ref 70–99)
Potassium: 3.7 mEq/L (ref 3.5–5.1)
Sodium: 140 mEq/L (ref 135–145)

## 2013-07-06 LAB — URINALYSIS, ROUTINE W REFLEX MICROSCOPIC
Hgb urine dipstick: NEGATIVE
Leukocytes, UA: NEGATIVE
Nitrite: NEGATIVE
Protein, ur: NEGATIVE mg/dL
Specific Gravity, Urine: 1.017 (ref 1.005–1.030)
Urobilinogen, UA: 1 mg/dL (ref 0.0–1.0)

## 2013-07-06 LAB — POCT PREGNANCY, URINE: Preg Test, Ur: NEGATIVE

## 2013-07-06 NOTE — ED Notes (Signed)
Patient transported to X-ray 

## 2013-07-06 NOTE — ED Provider Notes (Signed)
CSN: [id number]     Arrival date & time 07/06/13  1835 History   First MD Initiated Contact with Patient 07/06/13 1838     Chief Complaint  Patient presents with  . Seizures   (Consider location/radiation/quality/duration/timing/severity/associated sxs/prior Treatment) Patient is a 22 y.o. female presenting with seizures. The history is provided by the patient and a relative. No language interpreter was used.  Seizures Seizure activity on arrival: no   Seizure type: generalized shaking. Preceding symptoms comment:  Chest pain Initial focality:  None Episode characteristics: generalized shaking and partial responsiveness   Postictal symptoms comment:  No post-ictal Return to baseline: yes   Severity:  Mild Duration:  10 minutes Timing:  Once Number of seizures this episode:  1 Progression:  Resolved Context: medical compliance, not possible medication ingestion and not stress   Recent head injury:  No recent head injuries PTA treatment:  None History of seizures: yes     Past Medical History  Diagnosis Date  . Seizures   . Seizures     "converted seizures", treated at [facility]   Past Surgical History  Procedure Laterality Date  . None    . Wisdom tooth extraction     No family history on file. History  Substance Use Topics  . Smoking status: Never Smoker   . Smokeless tobacco: Not on file  . Alcohol Use: No   OB History   Grav Para Term Preterm Abortions TAB SAB Ect Mult Living   2 2 2  0 0 0 0 0 0 2     Review of Systems  Constitutional: Negative for fever.  HENT: Negative for congestion, sore throat and rhinorrhea.   Respiratory: Negative for cough and shortness of breath.   Cardiovascular: Negative for chest pain.  Gastrointestinal: Negative for nausea, vomiting, abdominal pain and diarrhea.  Genitourinary: Negative for dysuria and hematuria.  Skin: Negative for rash.  Neurological: Positive for seizures. Negative for syncope, light-headedness and headaches.   All other systems reviewed and are negative.    Allergies  Review of patient's allergies indicates no known allergies.  Home Medications   Current Outpatient Rx  Name  Route  Sig  Dispense  Refill  . medroxyPROGESTERone (DEPO-PROVERA) 150 MG/ML injection   Intramuscular   Inject 150 mg into the muscle every 3 (three) months.          BP 124/67  Pulse 75  Temp(Src) 98.7 F (37.1 C) (Oral)  Resp 22  SpO2 75% Physical Exam  Nursing note and vitals reviewed. Constitutional: She is oriented to person, place, and time. She appears well-developed and well-nourished.  HENT:  Head: Normocephalic and atraumatic.  Right Ear: External ear normal.  Left Ear: External ear normal.  Eyes: EOM are normal. Pupils are equal, round, and reactive to light.  Neck: Normal range of motion. Neck supple.  Cardiovascular: Normal rate, regular rhythm, normal heart sounds and intact distal pulses.  Exam reveals no gallop and no friction rub.   No murmur heard. Pulmonary/Chest: Effort normal and breath sounds normal. No respiratory distress. She has no wheezes. She has no rales. She exhibits no tenderness.  Abdominal: Soft. Bowel sounds are normal. She exhibits no distension. There is no tenderness. There is no rebound.  Musculoskeletal: Normal range of motion. She exhibits no edema and no tenderness.  Lymphadenopathy:    She has no cervical adenopathy.  Neurological: She is alert and oriented to person, place, and time.  Neurologic exam: Fundoscopic exam: no papilledema, no hemorrhages, CN  I-XII: grossly intact, Sensation: normal in upper and lower extremities, Strength 5/5 in both upper and lower extremities, Coordination intact. Gait normal.   Skin: Skin is warm. No rash noted.  Psychiatric: She has a normal mood and affect. Her behavior is normal.    ED Course  Procedures (including critical care time) Labs Review Labs Reviewed  CBC WITH DIFFERENTIAL - Abnormal; Notable for the following:     RBC 5.82 (*)    MCV 69.4 (*)    MCH 24.2 (*)    All other components within normal limits  URINALYSIS, ROUTINE W REFLEX MICROSCOPIC - Abnormal; Notable for the following:    APPearance CLOUDY (*)    All other components within normal limits  BASIC METABOLIC PANEL  POCT PREGNANCY, URINE   Imaging Review Dg Chest 2 View  07/06/2013   CLINICAL DATA:  Seizure.  EXAM: CHEST  2 VIEW  COMPARISON:  06/13/2013  FINDINGS: The heart size and mediastinal contours are within normal limits. Both lungs are clear. The visualized skeletal structures are unremarkable.  IMPRESSION: No active cardiopulmonary disease.   Electronically Signed   By: Oley Balm M.D.   On: 07/06/2013 20:22    MDM   1. Seizure    7:03 PM Pt is a 22 y.o. female with pertinent PMHX of seizures who presents to the ED with seizure. Seizure prior to arrival lasting 10 mins. Diastat not given. Not Seizing on arrival. Not on any medications. Pt noted to not have post ictal state. Per EMS pt was unconscious at scene and awoke in the ambulance without post-ictal state. Negative urinary incontinence. Negative for tongue biting. Similar to previous seizures. Denies recent illness or fevers. Fingerstick BS via EMS was 96. No increased stress at home. Pt noted chest pain prior to seizure. Generalized shaking per brother lasting 10 minutes. Partial responsiveness per brother. Pt was taken home and was found by EMS with partial responsiveness. Pt had no post-ictal state. Pt denies head injury.  On exam: AFVSS. Neurologic exam: Fundoscopic exam: no papilledema, no hemorrhages, CN I-XII: grossly intact, Sensation: normal in upper and lower extremities, Strength 5/5 in both upper and lower extremities, Coordination intact. Gait normal.  Review of Medical records from [facility] 2012 normal EEG, diagnosed with pseudoseizure. In 2012 had 120 min EEG at [facility] that showed non-epileptic seizures.  Review of Labs: CBC: no leukocytosis, no  anemia BMP: no electrolyte abnormalities UA: negative for UTI UPT: negative  CXR PA/LAT for seizures per my read showed no pna, no ptx  No evidence of electrolyte or infectious causes of seizure. History of non-epileptic seizures. Plan for pt to discharge home with follow up with PCP and neurologist. Instructed pt to not drive or do unsupervised activities. Pt verbalized   8:58 PM:I have discussed the diagnosis/risks/treatment options with the patient and believe the pt to be eligible for discharge home to follow-up with PCP in 1 week and neurologist. We also discussed returning to the ED immediately if new or worsening sx occur. We discussed the sx which are most concerning (e.g., worsening symptoms) that necessitate immediate return. Any new prescriptions provided to the patient are listed below.   New Prescriptions   No medications on file    The patient appears reasonably screened and/or stabilized for discharge and I doubt any other medical condition or other [facility] requiring further screening, evaluation or treatment in the ED at this time prior to discharge . Pt in agreement with discharge plan. Return precautions given.  Pt discharged VSS   Labs, and imaging reviewed by myself and considered in medical decision making if ordered.  Imaging interpreted by radiology. Pt was discussed with my attending, Dr. Salli Real, MD 07/06/13 2330

## 2013-07-06 NOTE — ED Notes (Signed)
Pt arrived by [facility] with c/o seizures. Pt had a hx of seizure but has not had one is over a year. Pt is not currently on any medication for seizure. Pts significant other was driving down the road, c/o CP and then went unconscious and started having a seizure. When EMS arrived pt still unconscious and became alert en route to hospital. Denies any pain or distress at this time. Denies any possibility of pregnancy. CBG-96 BP-130/72 HR-88

## 2013-07-06 NOTE — ED Notes (Signed)
On arrival to ED pt alert and oriented x's 4.  No tongue or mouth trauma present.  Pt not incontinent of bowel or bladder.

## 2013-07-06 NOTE — ED Provider Notes (Signed)
Medical screening examination/treatment/procedure(s) were conducted as a shared visit with resident-physician practitioner(s) and myself.  I personally evaluated the patient during the encounter.  Pt is a 22 y.o. female with pmhx as above presenting with seizure lasting 10 mins w/o post-ictal state and with partial responsiveness.  Records reviewd from OSH show nml EEG and diagnosis of pseudoseizure.  Here, pt at baseline, no focal neuro findings. CBC, BMP, UA unremarkable. Pt safe for continued outpt treatment with her neurologist.   1. Seizure      Shanna Cisco, MD 07/06/13 2354

## 2013-09-07 ENCOUNTER — Other Ambulatory Visit: Payer: Self-pay | Admitting: Plastic Surgery

## 2013-09-07 NOTE — H&P (Signed)
  Subjective:     Patient ID: Jaela Cho is a 22 y.o. female.  HPI  Pt of Dr. Shelton with history of back/neck mass present for 2 years with continued growth. Reports intermittent sharp pain in mass without radiation, no exacerbating factors. Was seen in ED 05/2013 and given Flexeril for possible muscle spasms. This has not relieved pain episodes. Had plain film following this. Denies numbness or tingling.    Review of Systems  Constitutional: Positive for unexpected weight change.  HENT: Negative.   Respiratory: Negative.   Musculoskeletal: Positive for joint swelling and myalgias.  Skin: Negative.   Neurological:       + hx pseudo seizures per [facility] system  Psychiatric/Behavioral:       +anxiety       Objective:   Physical Exam  Cardiovascular: Normal rate and regular rhythm.   Pulmonary/Chest: Breath sounds normal.  Musculoskeletal:  Upper back, low neck with non mobile firm mass 9 x 6 cm without fluctulence or erythema, NTTP  Lymphadenopathy:    She has no cervical adenopathy.       Assessment:     Soft tissue tumor back     Plan:     Reviewed plain film from [facility] dated 06/13/13.  Findings: Osseous structures are normal. No disc space narrowing.   There is soft tissue prominence overlying the spinous  processes of C7 and T1 and T2.    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 More rarely, location also found with liposarcoma. Now with pain and continued growth. Plan excision and reviewed may have drain in place. Visible scar, scar maturation over months, risk depressed contour and recurrence, need for additional procedures pending path reviewed. Additional risks including but not limited to bleeding, infection, hematoma, seroma wound healing problems, cardiopulmonary complications reviewed.  

## 2013-09-12 ENCOUNTER — Encounter (HOSPITAL_BASED_OUTPATIENT_CLINIC_OR_DEPARTMENT_OTHER): Payer: Self-pay | Admitting: *Deleted

## 2013-09-13 ENCOUNTER — Ambulatory Visit (HOSPITAL_BASED_OUTPATIENT_CLINIC_OR_DEPARTMENT_OTHER): Payer: Medicaid Other | Admitting: Anesthesiology

## 2013-09-13 ENCOUNTER — Encounter (HOSPITAL_BASED_OUTPATIENT_CLINIC_OR_DEPARTMENT_OTHER): Payer: Medicaid Other | Admitting: Anesthesiology

## 2013-09-13 ENCOUNTER — Ambulatory Visit (HOSPITAL_BASED_OUTPATIENT_CLINIC_OR_DEPARTMENT_OTHER)
Admission: RE | Admit: 2013-09-13 | Discharge: 2013-09-13 | Disposition: A | Discharge status: Home / Self Care | Payer: Medicaid Other | Source: Ambulatory Visit | Attending: Plastic Surgery | Admitting: Plastic Surgery

## 2013-09-13 ENCOUNTER — Encounter (HOSPITAL_BASED_OUTPATIENT_CLINIC_OR_DEPARTMENT_OTHER)
Admission: RE | Disposition: A | Discharge status: Home / Self Care | Payer: Self-pay | Source: Ambulatory Visit | Attending: Plastic Surgery

## 2013-09-13 ENCOUNTER — Encounter (HOSPITAL_BASED_OUTPATIENT_CLINIC_OR_DEPARTMENT_OTHER): Payer: Self-pay

## 2013-09-13 DIAGNOSIS — F411 Generalized anxiety disorder: Secondary | ICD-10-CM | POA: Insufficient documentation

## 2013-09-13 DIAGNOSIS — D1739 Benign lipomatous neoplasm of skin and subcutaneous tissue of other sites: Secondary | ICD-10-CM | POA: Insufficient documentation

## 2013-09-13 DIAGNOSIS — D481 Neoplasm of uncertain behavior of connective and other soft tissue: Secondary | ICD-10-CM

## 2013-09-13 HISTORY — PX: MASS EXCISION: SHX2000

## 2013-09-13 SURGERY — EXCISION MASS
Anesthesia: General | Site: Back | Wound class: Clean

## 2013-09-13 MED ORDER — FENTANYL CITRATE 0.05 MG/ML IJ SOLN
INTRAMUSCULAR | Status: AC
  Filled 2013-09-13: qty 4

## 2013-09-13 MED ORDER — MIDAZOLAM HCL 2 MG/2ML IJ SOLN: 1.0000 mg | INTRAMUSCULAR | Status: DC | PRN

## 2013-09-13 MED ORDER — OXYCODONE HCL 5 MG/5ML PO SOLN
5.0000 mg | Freq: Once | ORAL | Status: DC | PRN
Start: 2013-09-13 — End: 2013-09-13

## 2013-09-13 MED ORDER — LIDOCAINE HCL (CARDIAC) 20 MG/ML IV SOLN
INTRAVENOUS | Status: DC | PRN
  Administered 2013-09-13: 100 mg via INTRAVENOUS

## 2013-09-13 MED ORDER — HYDROCODONE-ACETAMINOPHEN 5-325 MG PO TABS: 1.0000 | ORAL_TABLET | ORAL | Status: DC | PRN

## 2013-09-13 MED ORDER — SUCCINYLCHOLINE CHLORIDE 20 MG/ML IJ SOLN
INTRAMUSCULAR | Status: DC | PRN
  Administered 2013-09-13: 100 mg via INTRAVENOUS

## 2013-09-13 MED ORDER — PROPOFOL 10 MG/ML IV BOLUS
INTRAVENOUS | Status: DC | PRN
  Administered 2013-09-13: 200 mg via INTRAVENOUS

## 2013-09-13 MED ORDER — ONDANSETRON HCL 4 MG/2ML IJ SOLN
INTRAMUSCULAR | Status: DC | PRN
  Administered 2013-09-13: 4 mg via INTRAVENOUS

## 2013-09-13 MED ORDER — METOCLOPRAMIDE HCL 5 MG/ML IJ SOLN: 10.0000 mg | Freq: Once | INTRAMUSCULAR | Status: DC | PRN

## 2013-09-13 MED ORDER — HYDROMORPHONE HCL PF 1 MG/ML IJ SOLN: 0.2500 mg | INTRAMUSCULAR | Status: DC | PRN

## 2013-09-13 MED ORDER — FENTANYL CITRATE 0.05 MG/ML IJ SOLN
INTRAMUSCULAR | Status: DC | PRN
  Administered 2013-09-13: 100 ug via INTRAVENOUS

## 2013-09-13 MED ORDER — LACTATED RINGERS IV SOLN
INTRAVENOUS | Status: DC
  Administered 2013-09-13: 12:00:00 via INTRAVENOUS

## 2013-09-13 MED ORDER — FENTANYL CITRATE 0.05 MG/ML IJ SOLN: 50.0000 ug | INTRAMUSCULAR | Status: DC | PRN

## 2013-09-13 MED ORDER — OXYCODONE HCL 5 MG PO TABS: 5.0000 mg | ORAL_TABLET | Freq: Once | ORAL | Status: DC | PRN

## 2013-09-13 MED ORDER — BUPIVACAINE-EPINEPHRINE 0.25% -1:200000 IJ SOLN
INTRAMUSCULAR | Status: DC | PRN
  Administered 2013-09-13: 9 mL

## 2013-09-13 MED ORDER — CEFAZOLIN SODIUM-DEXTROSE 2-3 GM-% IV SOLR
2.0000 g | INTRAVENOUS | Status: AC
  Administered 2013-09-13: 2 g via INTRAVENOUS

## 2013-09-13 MED ORDER — MIDAZOLAM HCL 5 MG/5ML IJ SOLN
INTRAMUSCULAR | Status: DC | PRN
  Administered 2013-09-13: 2 mg via INTRAVENOUS

## 2013-09-13 MED ORDER — CEFAZOLIN SODIUM-DEXTROSE 2-3 GM-% IV SOLR
INTRAVENOUS | Status: AC
  Filled 2013-09-13: qty 50

## 2013-09-13 MED ORDER — DEXAMETHASONE SODIUM PHOSPHATE 4 MG/ML IJ SOLN
INTRAMUSCULAR | Status: DC | PRN
  Administered 2013-09-13: 10 mg via INTRAVENOUS

## 2013-09-13 MED ORDER — MIDAZOLAM HCL 2 MG/2ML IJ SOLN
INTRAMUSCULAR | Status: AC
  Filled 2013-09-13: qty 2

## 2013-09-13 SURGICAL SUPPLY — 57 items
BANDAGE CONFORM 2  STR LF (GAUZE/BANDAGES/DRESSINGS) IMPLANT
BENZOIN TINCTURE PRP APPL 2/3 (GAUZE/BANDAGES/DRESSINGS) IMPLANT
BLADE SURG 15 STRL LF DISP TIS (BLADE) ×1 IMPLANT
BLADE SURG 15 STRL SS (BLADE) ×1
BLADE SURG ROTATE 9660 (MISCELLANEOUS) IMPLANT
BNDG ELASTIC 2 VLCR STRL LF (GAUZE/BANDAGES/DRESSINGS) IMPLANT
CANISTER SUCT 1200ML W/VALVE (MISCELLANEOUS) ×2 IMPLANT
CLEANER CAUTERY TIP 5X5 PAD (MISCELLANEOUS) IMPLANT
CORDS BIPOLAR (ELECTRODE) IMPLANT
COVER MAYO STAND STRL (DRAPES) ×2 IMPLANT
COVER TABLE BACK 60X90 (DRAPES) ×2 IMPLANT
DERMABOND ADVANCED (GAUZE/BANDAGES/DRESSINGS)
DERMABOND ADVANCED .7 DNX12 (GAUZE/BANDAGES/DRESSINGS) IMPLANT
DRAIN JP 10F RND SILICONE (MISCELLANEOUS) ×2 IMPLANT
DRAPE U-SHAPE 76X120 STRL (DRAPES) ×2 IMPLANT
ELECT COATED BLADE 2.86 ST (ELECTRODE) IMPLANT
ELECT NEEDLE BLADE 2-5/6 (NEEDLE) ×2 IMPLANT
ELECT REM PT RETURN 9FT ADLT (ELECTROSURGICAL) ×2
ELECT REM PT RETURN 9FT PED (ELECTROSURGICAL)
ELECTRODE REM PT RETRN 9FT PED (ELECTROSURGICAL) IMPLANT
ELECTRODE REM PT RTRN 9FT ADLT (ELECTROSURGICAL) ×1 IMPLANT
EVACUATOR SILICONE 100CC (DRAIN) ×2 IMPLANT
GAUZE SPONGE 4X4 12PLY STRL LF (GAUZE/BANDAGES/DRESSINGS) IMPLANT
GAUZE XEROFORM 1X8 LF (GAUZE/BANDAGES/DRESSINGS) IMPLANT
GLOVE BIO SURGEON STRL SZ 6 (GLOVE) ×2 IMPLANT
GLOVE BIO SURGEON STRL SZ 6.5 (GLOVE) IMPLANT
GLOVE SURG SS PI 7.0 STRL IVOR (GLOVE) ×2 IMPLANT
GOWN PREVENTION PLUS XLARGE (GOWN DISPOSABLE) ×4 IMPLANT
NEEDLE 27GAX1X1/2 (NEEDLE) IMPLANT
NEEDLE HYPO 30GX1 BEV (NEEDLE) ×2 IMPLANT
NS IRRIG 1000ML POUR BTL (IV SOLUTION) ×2 IMPLANT
PACK BASIN DAY SURGERY FS (CUSTOM PROCEDURE TRAY) ×2 IMPLANT
PAD CLEANER CAUTERY TIP 5X5 (MISCELLANEOUS)
PENCIL BUTTON HOLSTER BLD 10FT (ELECTRODE) ×2 IMPLANT
RUBBERBAND STERILE (MISCELLANEOUS) IMPLANT
SHEET MEDIUM DRAPE 40X70 STRL (DRAPES) IMPLANT
SPONGE GAUZE 2X2 8PLY STRL LF (GAUZE/BANDAGES/DRESSINGS) IMPLANT
SPONGE GAUZE 4X4 12PLY (GAUZE/BANDAGES/DRESSINGS) ×2 IMPLANT
STRIP CLOSURE SKIN 1/2X4 (GAUZE/BANDAGES/DRESSINGS) ×2 IMPLANT
SUCTION FRAZIER TIP 10 FR DISP (SUCTIONS) ×2 IMPLANT
SUT CHROMIC 4 0 P 3 18 (SUTURE) IMPLANT
SUT ETHILON 4 0 PS 2 18 (SUTURE) IMPLANT
SUT ETHILON 5 0 P 3 18 (SUTURE)
SUT MNCRL 6-0 UNDY P1 1X18 (SUTURE) ×1 IMPLANT
SUT MNCRL AB 4-0 PS2 18 (SUTURE) ×4 IMPLANT
SUT MON AB 5-0 P3 18 (SUTURE) IMPLANT
SUT MONOCRYL 6-0 P1 1X18 (SUTURE) ×1
SUT NYLON ETHILON 5-0 P-3 1X18 (SUTURE) IMPLANT
SUT PLAIN 5 0 P 3 18 (SUTURE) IMPLANT
SUT SILK 2 0 FS (SUTURE) ×2 IMPLANT
SUT VIC AB 5-0 P-3 18X BRD (SUTURE) IMPLANT
SUT VIC AB 5-0 P3 18 (SUTURE)
SUT VICRYL 4-0 PS2 18IN ABS (SUTURE) ×2 IMPLANT
SYR BULB 3OZ (MISCELLANEOUS) ×2 IMPLANT
SYR CONTROL 10ML LL (SYRINGE) ×2 IMPLANT
TRAY DSU PREP LF (CUSTOM PROCEDURE TRAY) IMPLANT
TUBE CONNECTING 20X1/4 (TUBING) ×2 IMPLANT

## 2013-09-13 NOTE — H&P (View-Only) (Signed)
  Subjective:     Patient ID: [name] is a 22 y.o. female.  HPI  Pt of Dr. Renae Gloss with history of back/neck mass present for 2 years with continued growth. Reports intermittent sharp pain in mass without radiation, no exacerbating factors. Was seen in ED 05/2013 and given Flexeril for possible muscle spasms. This has not relieved pain episodes. Had plain film following this. Denies numbness or tingling.    Review of Systems  Constitutional: Positive for unexpected weight change.  HENT: Negative.   Respiratory: Negative.   Musculoskeletal: Positive for joint swelling and myalgias.  Skin: Negative.   Neurological:       + hx pseudo seizures per [facility] system  Psychiatric/Behavioral:       +anxiety       Objective:   Physical Exam  Cardiovascular: Normal rate and regular rhythm.   Pulmonary/Chest: Breath sounds normal.  Musculoskeletal:  Upper back, low neck with non mobile firm mass 9 x 6 cm without fluctulence or erythema, NTTP  Lymphadenopathy:    She has no cervical adenopathy.       Assessment:     Soft tissue tumor back     Plan:     Reviewed plain film from [facility] dated 06/13/13.  Findings: Osseous structures are normal. No disc space narrowing.   There is soft tissue prominence overlying the spinous  processes of C7 and T1 and T2.    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 More rarely, location also found with liposarcoma. Now with pain and continued growth. Plan excision and reviewed may have drain in place. Visible scar, scar maturation over months, risk depressed contour and recurrence, need for additional procedures pending path reviewed. Additional risks including but not limited to bleeding, infection, hematoma, seroma wound healing problems, cardiopulmonary complications reviewed.

## 2013-09-13 NOTE — Progress Notes (Signed)
Noted pt's JP drain not holding charge, emptied and recharged. Still not holding JP drain charge. Called OR - Laddie Aquas RN  Came to discharge to see JP drain - still holding charge after being recharged two more times. Called Dr. Maude Leriche office and called her cell phone. After 10-15 mins no return call  So Lincoln Maxin RN applied Tegaderm dressing over site where drain was inserted. Watched drain still not holding charge. Had Tammy in OR page Dr. Leta Baptist. Answered page and said to leave drain to gravity if not keeping charge. Explained this to family to keep eye on pt and the area where the drain was inserted to make sure no fluid is accumulating under the skin. Told mother if any problems to call the office.

## 2013-09-13 NOTE — Anesthesia Postprocedure Evaluation (Signed)
Anesthesia Post Note  Patient: [name]  Procedure(s) Performed: Procedure(s) (LRB): EXCISION OF SOFT TISSUE MASS ON BACK (N/A)  Anesthesia type: General  Patient location: PACU  Post pain: Pain level controlled  Post assessment: Patient's Cardiovascular Status Stable  Last Vitals:  Filed Vitals:   09/13/13 1400  BP: 132/81  Pulse: 88  Temp:   Resp: 18    Post vital signs: Reviewed and stable  Level of consciousness: alert  Complications: No apparent anesthesia complications

## 2013-09-13 NOTE — Transfer of Care (Signed)
Immediate Anesthesia Transfer of Care Note  Patient: [name]  Procedure(s) Performed: Procedure(s): EXCISION OF SOFT TISSUE MASS ON BACK (N/A)  Patient Location: PACU  Anesthesia Type:General  Level of Consciousness: awake  Airway & Oxygen Therapy: Patient Spontanous Breathing and Patient connected to face mask oxygen  Post-op Assessment: Report given to PACU RN and Post -op Vital signs reviewed and stable  Post vital signs: Reviewed and stable  Complications: No apparent anesthesia complications

## 2013-09-13 NOTE — Anesthesia Preprocedure Evaluation (Signed)
Anesthesia Evaluation  Patient identified by MRN, date of birth, ID band Patient awake    Reviewed: Allergy & Precautions, H&P , NPO status , Patient's Chart, lab work & pertinent test results, reviewed documented beta blocker date and time   Airway Mallampati: II TM Distance: >3 FB Neck ROM: full    Dental   Pulmonary neg pulmonary ROS,  breath sounds clear to auscultation        Cardiovascular negative cardio ROS  Rhythm:regular     Neuro/Psych Seizures -,  negative psych ROS   GI/Hepatic negative GI ROS, Neg liver ROS,   Endo/Other  negative endocrine ROS  Renal/GU negative Renal ROS  negative genitourinary   Musculoskeletal   Abdominal   Peds  Hematology negative hematology ROS (+)   Anesthesia Other Findings See surgeon's H&P   Reproductive/Obstetrics negative OB ROS                           Anesthesia Physical Anesthesia Plan  ASA: II  Anesthesia Plan: General   Post-op Pain Management:    Induction: Intravenous  Airway Management Planned: Oral ETT  Additional Equipment:   Intra-op Plan:   Post-operative Plan: Extubation in OR  Informed Consent: I have reviewed the patients History and Physical, chart, labs and discussed the procedure including the risks, benefits and alternatives for the proposed anesthesia with the patient or authorized representative who has indicated his/her understanding and acceptance.   Dental Advisory Given  Plan Discussed with: CRNA and Surgeon  Anesthesia Plan Comments:         Anesthesia Quick Evaluation  

## 2013-09-13 NOTE — Anesthesia Procedure Notes (Signed)
Procedure Name: Intubation Performed by: Jahi Roza, [place] Pre-anesthesia Checklist: Patient identified, Emergency Drugs available, Suction available and Patient being monitored Patient Re-evaluated:Patient Re-evaluated prior to inductionOxygen Delivery Method: Circle System Utilized Preoxygenation: Pre-oxygenation with 100% oxygen Intubation Type: IV induction Ventilation: Mask ventilation without difficulty Laryngoscope Size: Mac and 3 Grade View: Grade I Tube type: Oral Tube size: 7.0 mm Number of attempts: 1 Airway Equipment and Method: stylet and oral airway Placement Confirmation: ETT inserted through vocal cords under direct vision,  positive ETCO2 and breath sounds checked- equal and bilateral Tube secured with: Tape Dental Injury: Teeth and Oropharynx as per pre-operative assessment      

## 2013-09-13 NOTE — Op Note (Signed)
Operative Note   DATE OF OPERATION: 09/13/13  SURGICAL DIVISION: Plastic Surgery  PREOPERATIVE DIAGNOSES:  Soft tissue mass back  POSTOPERATIVE DIAGNOSES:  same  PROCEDURE:  Excision subfascial mass back 9 x 6 cm  SURGEON: Glenna Fellows MD MBA  ASSISTANT: none  ANESTHESIA:  General.   EBL:  COMPLICATIONS: None.   INDICATIONS FOR PROCEDURE:  The patient, [name], is a 22 y.o. female born on September 21, 1991, is here for treatment of soft tissue nonmobile mass upper back with associated pain.   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  DESCRIPTION OF PROCEDURE:  The patient's operative site was marked with the patient in the preoperative area. The patient was taken to the operating room.  IV antibiotics were given and patient placed in prone position. The patient's operative site was prepped and draped in a sterile fashion. A time out was performed and all information was confirmed to be correct.  Transverse incision made over palpable mass Incision carried through dermis and superficial SQ tissue. Skin and subcutaneous tissue elevated off nonmobile mass. Mass then elevated off underlying muscle fascia. Following removal of specimen, cavity inspected and additional tissue from margins excised sharply until normal appearing subcutaneous tissue encountered. Wound irrigated and inspected for hemostasis. 0.25% marcaine with epinephrine 1:200 infiltrated throughout wound and muscle fascia. 10 Fr round drain placed and secured to skin with 2-0 silk. Quilting sutures of 4-0 vicryl used to tack skin  Flap to underlying muscle fascia. Incision closed with 4-0 vicryl in dermis and running 4-0 monocryl subcuticular skin closure. Steris applied followed by dry dressing.   The patient was returned to supine position and allowed to wake from anesthesia, extubated and taken to the recovery room in satisfactory condition.   SPECIMENS: mass  back  DRAINS: 10 Fr JP

## 2013-09-13 NOTE — Interval H&P Note (Signed)
History and Physical Interval Note:  09/13/2013 11:19 AM  [name]  has presented today for surgery, with the diagnosis of Soft Tissue Mass on Back  The various methods of treatment have been discussed with the patient and family. After consideration of risks, benefits and other options for treatment, the patient has consented to  Procedure(s): EXCISION OF SOFT TISSUE MASS ON BACK (N/A) as a surgical intervention .  The patient's history has been reviewed, patient examined, no change in status, stable for surgery.  I have reviewed the patient's chart and labs.  Questions were answered to the patient's satisfaction.     [name]

## 2013-09-14 ENCOUNTER — Encounter (HOSPITAL_BASED_OUTPATIENT_CLINIC_OR_DEPARTMENT_OTHER): Payer: Self-pay | Admitting: Plastic Surgery

## 2013-10-10 ENCOUNTER — Ambulatory Visit: Payer: Self-pay | Admitting: Diagnostic Neuroimaging

## 2014-07-07 ENCOUNTER — Emergency Department (HOSPITAL_COMMUNITY): Payer: No Typology Code available for payment source

## 2014-07-07 ENCOUNTER — Encounter (HOSPITAL_COMMUNITY): Payer: Self-pay | Admitting: Emergency Medicine

## 2014-07-07 ENCOUNTER — Emergency Department (HOSPITAL_COMMUNITY)
Admission: EM | Admit: 2014-07-07 | Discharge: 2014-07-07 | Disposition: A | Discharge status: Home / Self Care | Payer: No Typology Code available for payment source | Attending: Emergency Medicine | Admitting: Emergency Medicine

## 2014-07-07 DIAGNOSIS — S99929A Unspecified injury of unspecified foot, initial encounter: Secondary | ICD-10-CM

## 2014-07-07 DIAGNOSIS — Y9389 Activity, other specified: Secondary | ICD-10-CM | POA: Insufficient documentation

## 2014-07-07 DIAGNOSIS — Y92410 Unspecified street and highway as the place of occurrence of the external cause: Secondary | ICD-10-CM | POA: Diagnosis not present

## 2014-07-07 DIAGNOSIS — S8990XA Unspecified injury of unspecified lower leg, initial encounter: Secondary | ICD-10-CM | POA: Insufficient documentation

## 2014-07-07 DIAGNOSIS — S0990XA Unspecified injury of head, initial encounter: Secondary | ICD-10-CM | POA: Diagnosis not present

## 2014-07-07 DIAGNOSIS — Z79899 Other long term (current) drug therapy: Secondary | ICD-10-CM | POA: Insufficient documentation

## 2014-07-07 DIAGNOSIS — G40909 Epilepsy, unspecified, not intractable, without status epilepticus: Secondary | ICD-10-CM | POA: Insufficient documentation

## 2014-07-07 DIAGNOSIS — S99919A Unspecified injury of unspecified ankle, initial encounter: Secondary | ICD-10-CM

## 2014-07-07 DIAGNOSIS — IMO0002 Reserved for concepts with insufficient information to code with codable children: Secondary | ICD-10-CM | POA: Insufficient documentation

## 2014-07-07 MED ORDER — ACETAMINOPHEN 325 MG PO TABS
650.0000 mg | ORAL_TABLET | Freq: Once | ORAL | Status: AC
  Administered 2014-07-07: 650 mg via ORAL
  Filled 2014-07-07: qty 2

## 2014-07-07 MED ORDER — TRAMADOL HCL 50 MG PO TABS: 50.0000 mg | ORAL_TABLET | Freq: Four times a day (QID) | ORAL | Status: DC | PRN

## 2014-07-07 MED ORDER — NAPROXEN 500 MG PO TABS: 500.0000 mg | ORAL_TABLET | Freq: Two times a day (BID) | ORAL | Status: DC

## 2014-07-07 MED ORDER — METHOCARBAMOL 500 MG PO TABS: 500.0000 mg | ORAL_TABLET | Freq: Two times a day (BID) | ORAL | Status: DC
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
## 2014-07-08 ENCOUNTER — Emergency Department (HOSPITAL_COMMUNITY): Payer: No Typology Code available for payment source

## 2014-07-08 ENCOUNTER — Encounter (HOSPITAL_COMMUNITY): Payer: Self-pay | Admitting: Emergency Medicine

## 2014-07-08 ENCOUNTER — Emergency Department (HOSPITAL_COMMUNITY)
Admission: EM | Admit: 2014-07-08 | Discharge: 2014-07-08 | Disposition: A | Discharge status: Home / Self Care | Payer: No Typology Code available for payment source | Attending: Emergency Medicine | Admitting: Emergency Medicine

## 2014-07-08 DIAGNOSIS — S59919A Unspecified injury of unspecified forearm, initial encounter: Secondary | ICD-10-CM

## 2014-07-08 DIAGNOSIS — S59909A Unspecified injury of unspecified elbow, initial encounter: Secondary | ICD-10-CM | POA: Diagnosis present

## 2014-07-08 DIAGNOSIS — S99919A Unspecified injury of unspecified ankle, initial encounter: Secondary | ICD-10-CM

## 2014-07-08 DIAGNOSIS — S298XXA Other specified injuries of thorax, initial encounter: Secondary | ICD-10-CM | POA: Diagnosis not present

## 2014-07-08 DIAGNOSIS — Z79899 Other long term (current) drug therapy: Secondary | ICD-10-CM | POA: Diagnosis not present

## 2014-07-08 DIAGNOSIS — S6990XA Unspecified injury of unspecified wrist, hand and finger(s), initial encounter: Secondary | ICD-10-CM

## 2014-07-08 DIAGNOSIS — S8990XA Unspecified injury of unspecified lower leg, initial encounter: Secondary | ICD-10-CM | POA: Diagnosis not present

## 2014-07-08 DIAGNOSIS — Y92410 Unspecified street and highway as the place of occurrence of the external cause: Secondary | ICD-10-CM | POA: Insufficient documentation

## 2014-07-08 DIAGNOSIS — IMO0002 Reserved for concepts with insufficient information to code with codable children: Secondary | ICD-10-CM | POA: Insufficient documentation

## 2014-07-08 DIAGNOSIS — Y9389 Activity, other specified: Secondary | ICD-10-CM | POA: Diagnosis not present

## 2014-07-08 DIAGNOSIS — Z791 Long term (current) use of non-steroidal anti-inflammatories (NSAID): Secondary | ICD-10-CM | POA: Diagnosis not present

## 2014-07-08 DIAGNOSIS — S99929A Unspecified injury of unspecified foot, initial encounter: Secondary | ICD-10-CM

## 2014-07-08 DIAGNOSIS — M25532 Pain in left wrist: Secondary | ICD-10-CM

## 2014-07-08 DIAGNOSIS — S60219A Contusion of unspecified wrist, initial encounter: Secondary | ICD-10-CM | POA: Diagnosis not present

## 2014-07-08 DIAGNOSIS — R0789 Other chest pain: Secondary | ICD-10-CM

## 2014-07-08 NOTE — ED Notes (Signed)
Declined W/C at D/C and was escorted to lobby by RN. 

## 2014-07-08 NOTE — ED Provider Notes (Signed)
Medical screening examination/treatment/procedure(s) were performed by non-physician practitioner and as supervising physician I was immediately available for consultation/collaboration.  Ernestina Patches, MD 07/08/14 1017

## 2014-07-08 NOTE — Discharge Instructions (Signed)
Please call your doctor for a followup appointment within 24-48 hours. When you talk to your doctor please let them know that you were seen in the emergency department and have them acquire all of your records so that they can discuss the findings with you and formulate a treatment plan to fully care for your new and ongoing problems. Please call and set-up an appointment with hand specialist Please rest and stay hydrated Please avoid any physical or strenuous activity  Please keep brace on at all times Please rest, ice, elevate Please continue to monitor symptoms closely and if symptoms are to worsen or change (fever greater than 101, chills, sweating, nausea, vomiting, chest pain, shortness of breathe, difficulty breathing, weakness, numbness, tingling, worsening or changes to pain pattern, fall, injury, swelling, headache, dizziness, visual changes) please report back to the Emergency Department immediately.    Arthralgia Your caregiver has diagnosed you as suffering from an arthralgia. Arthralgia means there is pain in a joint. This can come from many reasons including:  Bruising the joint which causes soreness (inflammation) in the joint.  Wear and tear on the joints which occur as we grow older (osteoarthritis).  Overusing the joint.  Various forms of arthritis.  Infections of the joint. Regardless of the cause of pain in your joint, most of these different pains respond to anti-inflammatory drugs and rest. The exception to this is when a joint is infected, and these cases are treated with antibiotics, if it is a bacterial infection. HOME CARE INSTRUCTIONS   Rest the injured area for as long as directed by your caregiver. Then slowly start using the joint as directed by your caregiver and as the pain allows. Crutches as directed may be useful if the ankles, knees or hips are involved. If the knee was splinted or casted, continue use and care as directed. If an stretchy or elastic  wrapping bandage has been applied today, it should be removed and re-applied every 3 to 4 hours. It should not be applied tightly, but firmly enough to keep swelling down. Watch toes and feet for swelling, bluish discoloration, coldness, numbness or excessive pain. If any of these problems (symptoms) occur, remove the ace bandage and re-apply more loosely. If these symptoms persist, contact your caregiver or return to this location.  For the first 24 hours, keep the injured extremity elevated on pillows while lying down.  Apply ice for 15-20 minutes to the sore joint every couple hours while awake for the first half day. Then 03-04 times per day for the first 48 hours. Put the ice in a plastic bag and place a towel between the bag of ice and your skin.  Wear any splinting, casting, elastic bandage applications, or slings as instructed.  Only take over-the-counter or prescription medicines for pain, discomfort, or fever as directed by your caregiver. Do not use aspirin immediately after the injury unless instructed by your physician. Aspirin can cause increased bleeding and bruising of the tissues.  If you were given crutches, continue to use them as instructed and do not resume weight bearing on the sore joint until instructed. Persistent pain and inability to use the sore joint as directed for more than 2 to 3 days are warning signs indicating that you should see a caregiver for a follow-up visit as soon as possible. Initially, a hairline fracture (break in bone) may not be evident on X-rays. Persistent pain and swelling indicate that further evaluation, non-weight bearing or use of the joint (use of  crutches or slings as instructed), or further X-rays are indicated. X-rays may sometimes not show a small fracture until a week or 10 days later. Make a follow-up appointment with your own caregiver or one to whom we have referred you. A radiologist (specialist in reading X-rays) may read your X-rays. Make  sure you know how you are to obtain your X-ray results. Do not assume everything is normal if you do not hear from [place]. SEEK MEDICAL CARE IF: Bruising, swelling, or pain increases. SEEK IMMEDIATE MEDICAL CARE IF:   Your fingers or toes are numb or blue.  The pain is not responding to medications and continues to stay the same or get worse.  The pain in your joint becomes severe.  You develop a fever over 102 F (38.9 C).  It becomes impossible to move or use the joint. MAKE SURE YOU:   Understand these instructions.  Will watch your condition.  Will get help right away if you are not doing well or get worse. Document Released: 10/06/2005 Document Revised: 12/29/2011 Document Reviewed: 05/24/2008 [facility] Patient Information 2015 [place], [place]. This information is not intended to replace advice given to you by your health care provider. Make sure you discuss any questions you have with your health care provider.

## 2014-07-08 NOTE — Progress Notes (Signed)
Orthopedic Tech Progress Note Patient Details:  [name] November 13, 1990 [id number]  Ortho Devices Type of Ortho Device: Thumb velcro splint Ortho Device/Splint Interventions: Application   [name], [name] 07/08/2014, 12:51 PM
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
## 2014-07-19 NOTE — ED Provider Notes (Signed)
Medical screening examination/treatment/procedure(s) were performed by non-physician practitioner and as supervising physician I was immediately available for consultation/collaboration.   EKG Interpretation None       Babette Relic, MD 07/19/14 [phone]

## 2014-08-21 ENCOUNTER — Encounter (HOSPITAL_COMMUNITY): Payer: Self-pay | Admitting: Emergency Medicine

## 2014-10-02 ENCOUNTER — Emergency Department (HOSPITAL_COMMUNITY)
Admission: EM | Admit: 2014-10-02 | Discharge: 2014-10-02 | Disposition: A | Discharge status: Home / Self Care | Payer: Medicaid Other | Source: Home / Self Care | Attending: Emergency Medicine | Admitting: Emergency Medicine

## 2014-10-02 ENCOUNTER — Encounter (HOSPITAL_COMMUNITY): Payer: Self-pay | Admitting: Emergency Medicine

## 2014-10-02 DIAGNOSIS — J040 Acute laryngitis: Secondary | ICD-10-CM

## 2014-10-02 MED ORDER — IPRATROPIUM BROMIDE 0.06 % NA SOLN: 2.0000 | Freq: Four times a day (QID) | NASAL | Status: DC

## 2014-10-02 MED ORDER — PREDNISONE 20 MG PO TABS: 20.0000 mg | ORAL_TABLET | Freq: Two times a day (BID) | ORAL | Status: DC

## 2014-10-02 MED ORDER — TRAMADOL HCL 50 MG PO TABS: ORAL_TABLET | ORAL | Status: DC

## 2014-10-02 NOTE — ED Notes (Signed)
Sore throat, cough 12/9

## 2014-10-02 NOTE — ED Provider Notes (Signed)
  Chief Complaint   Sore Throat and Cough   History of Present Illness   [name] is a 23 year old female who has had a six-day history of sore throat, hoarseness, cough productive of clear sputum, posttussive vomiting, and rhinorrhea with yellowish drainage. She denies fever, chills, chest tightness, wheezing, chest pain, headache, sinus pressure, or ear pain. She has had no known exposures.  Review of Systems   Other than as noted above, the patient denies any of the following symptoms: Systemic:  No fevers, chills, sweats, or myalgias. Eye:  No redness or discharge. ENT:  No ear pain, headache, nasal congestion, drainage, sinus pressure, or sore throat. Neck:  No neck pain, stiffness, or swollen glands. Lungs:  No cough, sputum production, hemoptysis, wheezing, chest tightness, shortness of breath or chest pain. GI:  No abdominal pain, nausea, vomiting or diarrhea.  [place]   Past medical history, family history, social history, meds, and allergies were reviewed.   Physical exam   Vital signs:  BP 110/70 mmHg  Pulse 71  Temp(Src) 98.3 F (36.8 C) (Oral)  Resp 16  SpO2 99% General:  Alert and oriented.  In no distress.  Skin warm and dry. Eye:  No conjunctival injection or drainage. Lids were normal. ENT:  TMs and canals were normal, without erythema or inflammation.  Nasal mucosa was clear and uncongested, without drainage.  Mucous membranes were moist.  Pharynx was clear with no exudate or drainage.  There were no oral ulcerations or lesions. Neck:  Supple, no adenopathy, tenderness or mass. Lungs:  No respiratory distress.  Lungs were clear to auscultation, without wheezes, rales or rhonchi.  Breath sounds were clear and equal bilaterally.  Heart:  Regular rhythm, without gallops, murmers or rubs. Skin:  Clear, warm, and dry, without rash or lesions.  Assessment     The encounter diagnosis was Laryngitis.  There is no evidence of pneumonia, strep throat,  sinusitis, otitis media.    Plan    1.  Meds:  The following meds were prescribed:   Discharge Medication List as of 10/02/2014 10:02 AM    START taking these medications   Details  ipratropium (ATROVENT) 0.06 % nasal spray Place 2 sprays into both nostrils 4 (four) times daily., Starting 10/02/2014, Until Discontinued, Normal    predniSONE (DELTASONE) 20 MG tablet Take 1 tablet (20 mg total) by mouth 2 (two) times daily., Starting 10/02/2014, Until Discontinued, Normal       She was also given tramadol 50 mg, #30, one to 2 every 8 hours as needed for cough.  2.  Patient Education/Counseling:  The patient was given appropriate handouts, self care instructions, and instructed in symptomatic relief.  Instructed to get extra fluids and extra rest.    3.  Follow up:  The patient was told to follow up here if no better in 3 to 4 days, or sooner if becoming worse in any way, and given some red flag symptoms such as increasing fever, difficulty breathing, chest pain, or persistent vomiting which would prompt immediate return.       Harden Mo, MD 10/02/14 [phone]

## 2014-10-02 NOTE — ED Notes (Signed)
Patient also received work note

## 2014-10-02 NOTE — Discharge Instructions (Signed)
Laryngitis At the top of your windpipe is your voice box. It is the source of your voice. Inside your voice box are 2 bands of muscles called vocal cords. When you breathe, your vocal cords are relaxed and open so that air can get into the lungs. When you decide to say something, these cords come together and vibrate. The sound from these vibrations goes into your throat and comes out through your mouth as sound. Laryngitis is an inflammation of the vocal cords that causes hoarseness, cough, loss of voice, sore throat, and dry throat. Laryngitis can be temporary (acute) or long-term (chronic). Most cases of acute laryngitis improve with time.Chronic laryngitis lasts for more than 3 weeks. CAUSES Laryngitis can often be related to excessive smoking, talking, or yelling, as well as inhalation of toxic fumes and allergies. Acute laryngitis is usually caused by a viral infection, vocal strain, measles or mumps, or bacterial infections. Chronic laryngitis is usually caused by vocal cord strain, vocal cord injury, postnasal drip, growths on the vocal cords, or acid reflux. SYMPTOMS  1. Cough. 2. Sore throat. 3. Dry throat. RISK FACTORS  Respiratory infections.  Exposure to irritating substances, such as cigarette smoke, excessive amounts of alcohol, stomach acids, and workplace chemicals.  Voice trauma, such as vocal cord injury from shouting or speaking too loud. DIAGNOSIS  Your cargiver will perform a physical exam. During the physical exam, your caregiver will examine your throat. The most common sign of laryngitis is hoarseness. Laryngoscopy may be necessary to confirm the diagnosis of this condition. This procedure allows your caregiver to look into the larynx. HOME CARE INSTRUCTIONS  Drink enough fluids to keep your urine clear or pale yellow.  Rest until you no longer have symptoms or as directed by your caregiver.  Breathe in moist air.  Take all medicine as directed by your  caregiver.  Do not smoke.  Talk as little as possible (this includes whispering).  Write on paper instead of talking until your voice is back to normal.  Follow up with your caregiver if your condition has not improved after 10 days. SEEK MEDICAL CARE IF:   You have trouble breathing.  You cough up blood.  You have persistent fever.  You have increasing pain.  You have difficulty swallowing. MAKE SURE YOU:  Understand these instructions.  Will watch your condition.  Will get help right away if you are not doing well or get worse. Document Released: 10/06/2005 Document Revised: 12/29/2011 Document Reviewed: 12/12/2010 [employer] Patient Information 2015 [place], [place]. This information is not intended to replace advice given to you by your health care provider. Make sure you discuss any questions you have with your health care provider.  Most upper respiratory infections are caused by viruses and do not require antibiotics.  We try to save the antibiotics for when we really need them to prevent bacteria from developing resistance to them.  Here are a few hints about things that can be done at home to help get over an upper respiratory infection quicker:  Get extra sleep and extra fluids.  Get 7 to 9 hours of sleep per night and 6 to 8 glasses of water a day.  Getting extra sleep keeps the immune system from getting run down.  Most people with an upper respiratory infection are a little dehydrated.  The extra fluids also keep the secretions liquified and easier to deal with.  Also, get extra vitamin C.  4000 mg per day is the recommended dose. For  the aches, headache, and fever, acetaminophen or ibuprofen are helpful.  These can be alternated every 4 hours.  People with liver disease should avoid large amounts of acetaminophen, and people with ulcer disease, gastroesophageal reflux, gastritis, congestive heart failure, chronic kidney disease, coronary artery disease and the elderly  should avoid ibuprofen. For nasal congestion try Mucinex-D, or if you're having lots of sneezing or clear nasal drainage use Zyrtec-D. People with high blood pressure can take these if their blood pressure is controlled, if not, it's best to avoid the forms with a "D" (decongestants).  You can use the plain Mucinex, Allegra, Claritin, or Zyrtec even if your blood pressure is not controlled.   A Saline nasal spray such as Ocean Spray can also help.  You can add a decongestant sprays such as Afrin, but you should not use the decongestant sprays for more than 3 or 4 days since they can be habituating.  Breathe Rite nasal strips can also offer a non-drug alternative treatment to nasal congestion, especially at night. For people with symptoms of sinusitis, sleeping with your head elevated can be helpful.  For sinus pain, moist, hot compresses to the face may provide some relief.  Many people find that inhaling steam as in a shower or from a pot of steaming water can help. For any viral infection, zinc containing lozenges such as Cold-Eze or Zicam are helpful.  Zinc helps to fight viral infection.  Hot salt water gargles (8 oz of hot water, 1/2 tsp of table salt, and a pinch of baking soda) can give relief as well as hot beverages such as hot tea.  Sucrets extra strength lozenges will help the sore throat.  For the cough, take Delsym 2 tsp every 12 hours.  It has also been found recently that Aleve can help control a cough.  The dose is 1 to 2 tablets twice daily with food.  This can be combined with Delsym. (Note, if you are taking ibuprofen, you should not take Aleve as well--take one or the other.) A cool mist vaporizer will help keep your mucous membranes from drying out.   It's important when you have an upper respiratory infection not to pass the infection to others.  This involves being very careful about the following:  Frequent hand washing or use of hand sanitizer, especially after coughing, sneezing,  blowing your nose or touching your face, nose or eyes. Do not shake hands or touch anyone and try to avoid touching surfaces that other people use such as doorknobs, shopping carts, telephones and computer keyboards. Use tissues and dispose of them properly in a garbage can or ziplock bag. Cough into your sleeve. Do not let others eat or drink after you.  It's also important to recognize the signs of serious illness and get evaluated if they occur: Any respiratory infection that lasts more than 7 to 10 days.  Yellow nasal drainage and sputum are not reliable indicators of a bacterial infection, but if they last for more than 1 week, see your doctor. Fever and sore throat can indicate strep. Fever and cough can indicate influenza or pneumonia. Any kind of severe symptom such as difficulty breathing, intractable vomiting, or severe pain should prompt you to see a doctor as soon as possible.   Your body's immune system is really the thing that will get rid of this infection.  Your immune system is comprised of 2 types of specialized cells called T cells and B cells.  T cells coordinate  the array of cells in your body that engulf invading bacteria or viruses while B cells orchestrate the production of antibodies that neutralize infection.  Anything we do or any medications we give you, will just strengthen your immune system or help it clear up the infection quicker.  Here are a few helpful hints to improve your immune system to help overcome this illness or to prevent future infections:  A few vitamins can improve the health of your immune system.  That's why your diet should include plenty of fruits, vegetables, fish, nuts, and whole grains.  Vitamin A and bet-carotene can increase the cells that fight infections (T cells and B cells).  Vitamin A is abundant in dark greens and orange vegetables such as spinach, greens, sweet potatoes, and carrots.  Vitamin B6 contributes to the maturation of white  blood cells, the cells that fight disease.  Foods with vitamin B6 include cold cereal and bananas.  Vitamin C is credited with preventing colds because it increases white blood cells and also prevents cellular damage.  Citrus fruits, peaches and green and red bell peppers are all hight in vitamin C.  Vitamin E is an anti-oxidant that encourages the production of natural killer cells which reject foreign invaders and B cells that produce antibodies.  Foods high in vitamin E include wheat germ, nuts and seeds.  Foods high in omega-3 fatty acids found in foods like salmon, tuna and mackerel boost your immune system and help cells to engulf and absorb germs.  Probiotics are good bacteria that increase your T cells.  These can be found in yogurt and are available in supplements such as Culturelle or Align.  Moderate exercise increases the strength of your immune system and your ability to recover from illness.  I suggest 3 to 5 moderate intensity 30 minute workouts per week.    Sleep is another component of maintaining a strong immune system.  It enables your body to recuperate from the day's activities, stress and work.  My recommendation is to get between 7 and 9 hours of sleep per night.  If you smoke, try to quit completely or at least cut down.  Drink alcohol only in moderation if at all.  No more than 2 drinks daily for men or 1 for women.  Get a flu vaccine early in the fall or if you have not gotten one yet, once this illness has run its course.  If you are over 65, a smoker, or an asthmatic, get a pneumococcal vaccine.  My final recommendation is to maintain a healthy weight.  Excess weight can impair the immune system by interfering with the way the immune system deals with invading viruses or bacteria.

## 2014-12-29 ENCOUNTER — Emergency Department (INDEPENDENT_AMBULATORY_CARE_PROVIDER_SITE_OTHER)
Admission: EM | Admit: 2014-12-29 | Discharge: 2014-12-29 | Disposition: A | Discharge status: Home / Self Care | Payer: 59 | Source: Home / Self Care | Attending: Emergency Medicine | Admitting: Emergency Medicine

## 2014-12-29 ENCOUNTER — Encounter (HOSPITAL_COMMUNITY): Payer: Self-pay | Admitting: Emergency Medicine

## 2014-12-29 DIAGNOSIS — G43009 Migraine without aura, not intractable, without status migrainosus: Secondary | ICD-10-CM | POA: Diagnosis not present

## 2014-12-29 MED ORDER — KETOROLAC TROMETHAMINE 60 MG/2ML IM SOLN
60.0000 mg | Freq: Once | INTRAMUSCULAR | Status: AC
  Administered 2014-12-29: 60 mg via INTRAMUSCULAR

## 2014-12-29 MED ORDER — KETOROLAC TROMETHAMINE 60 MG/2ML IM SOLN
INTRAMUSCULAR | Status: AC
  Filled 2014-12-29: qty 2

## 2014-12-29 MED ORDER — SUMATRIPTAN SUCCINATE 100 MG PO TABS: 100.0000 mg | ORAL_TABLET | ORAL | Status: DC | PRN

## 2014-12-29 MED ORDER — IBUPROFEN 800 MG PO TABS: 800.0000 mg | ORAL_TABLET | Freq: Three times a day (TID) | ORAL | Status: DC

## 2014-12-29 NOTE — ED Provider Notes (Signed)
CSN: [id number]     Arrival date & time 12/29/14  0800 History   First MD Initiated Contact with Patient 12/29/14 0818     Chief Complaint  Patient presents with  . Headache   (Consider location/radiation/quality/duration/timing/severity/associated sxs/prior Treatment) HPI  She is a 24 year old woman here for evaluation of headache. She states her headache started 3 days ago. It comes and goes. It is described as right-sided and throbbing. It is associated with some photophobia and mild nausea. No vomiting. No focal numbness, tingling, weakness. She has a history of migraine headaches that are similar. She states the headache will improve with ibuprofen 600 mg, but then gradually come back.  Past Medical History  Diagnosis Date  . Seizures   . Seizures     "converted seizures", treated at [facility], non-epileptic    Past Surgical History  Procedure Laterality Date  . None    . Wisdom tooth extraction    . Mass excision N/A 09/13/2013    Procedure: EXCISION OF SOFT TISSUE MASS ON BACK;  Surgeon: Irene Limbo, MD;  Location: [facility];  Service: Plastics;  Laterality: N/A;   Family History  Problem Relation Age of Onset  . Hypertension Mother    History  Substance Use Topics  . Smoking status: Never Smoker   . Smokeless tobacco: Never Used  . Alcohol Use: No   OB History    Gravida Para Term Preterm AB TAB SAB Ectopic Multiple Living   2 2 2  0 0 0 0 0 0 2     Review of Systems  Eyes: Positive for photophobia.  Gastrointestinal: Positive for nausea. Negative for vomiting.  Neurological: Positive for headaches. Negative for dizziness, syncope, weakness and numbness.    Allergies  Review of patient's allergies indicates no known allergies.  Home Medications   Prior to Admission medications   Medication Sig Start Date End Date Taking? Authorizing Provider  ibuprofen (ADVIL,MOTRIN) 800 MG tablet Take 1 tablet (800 mg total) by mouth 3  (three) times daily. 12/29/14   Melony Overly, MD  ipratropium (ATROVENT) 0.06 % nasal spray Place 2 sprays into both nostrils 4 (four) times daily. 10/02/14   Harden Mo, MD  medroxyPROGESTERone (DEPO-PROVERA) 150 MG/ML injection Inject 150 mg into the muscle every 3 (three) months.    Historical Provider, MD  methocarbamol (ROBAXIN) 500 MG tablet Take 1 tablet (500 mg total) by mouth 2 (two) times daily. 07/07/14   Heather Laisure, PA-C  naproxen (NAPROSYN) 500 MG tablet Take 1 tablet (500 mg total) by mouth 2 (two) times daily. 07/07/14   Heather Laisure, PA-C  predniSONE (DELTASONE) 20 MG tablet Take 1 tablet (20 mg total) by mouth 2 (two) times daily. 10/02/14   Harden Mo, MD  SUMAtriptan (IMITREX) 100 MG tablet Take 1 tablet (100 mg total) by mouth every 2 (two) hours as needed for migraine or headache. May repeat in 2 hours if headache persists or recurs. 12/29/14   Melony Overly, MD  traMADol (ULTRAM) 50 MG tablet 1 to 2 tablets every 8 hours as needed for cough 10/02/14   Harden Mo, MD   BP 121/79 mmHg  Pulse 68  Temp(Src) 98.4 F (36.9 C) (Oral)  Resp 16  SpO2 99% Physical Exam  Constitutional: She is oriented to person, place, and time. She appears well-developed and well-nourished. No distress.  HENT:  Head: Normocephalic and atraumatic.  Mouth/Throat: Oropharynx is clear and moist.  Eyes: Conjunctivae and  EOM are normal. Pupils are equal, round, and reactive to light.  Neck: Neck supple.  Cardiovascular: Normal rate, regular rhythm and normal heart sounds.   No murmur heard. Pulmonary/Chest: Effort normal and breath sounds normal. No respiratory distress. She has no wheezes. She has no rales.  Lymphadenopathy:    She has no cervical adenopathy.  Neurological: She is alert and oriented to person, place, and time. No cranial nerve deficit. She exhibits normal muscle tone. Coordination normal.    ED Course  Procedures (including critical care time) Labs Review Labs  Reviewed - No data to display  Imaging Review No results found.   MDM   1. Migraine without aura and without status migrainosus, not intractable    Toradol 60 mg IM given.  Prescriptions for Imitrex and ibuprofen 800 mg given to use at home. Discussed possible triggers for migraine headaches. Recommended follow-up with PCP if she is having more than 2-3 migraines a month. She is to go to the ER if headache changes or worsens, or does not improve in the next 2 days.    Melony Overly, MD 12/29/14 [phone]

## 2014-12-29 NOTE — Discharge Instructions (Signed)
You are likely having migraine headaches. I provided prescriptions for Imitrex and ibuprofen 800 mg. Take these at the start of a migraine. Keep an eye out for possible triggers. These can include lack of sleep, dehydration, artificial sweeteners, chocolate. If you are having more than 2-3 migraines a month, please see your primary care provider to discuss prophylactic treatment. If your headache changes or does not resolve in the next few days, please to the ER.

## 2014-12-29 NOTE — ED Notes (Signed)
C/o HA onset Tuesday; taking tyle w/temp relief; hx of migraines Alert, no signs of acute distress.

## 2015-04-04 ENCOUNTER — Encounter (HOSPITAL_COMMUNITY): Payer: Self-pay | Admitting: Emergency Medicine

## 2015-04-04 ENCOUNTER — Emergency Department (HOSPITAL_COMMUNITY)
Admission: EM | Admit: 2015-04-04 | Discharge: 2015-04-04 | Disposition: A | Discharge status: Home / Self Care | Payer: 59 | Attending: Emergency Medicine | Admitting: Emergency Medicine

## 2015-04-04 DIAGNOSIS — M25842 Other specified joint disorders, left hand: Secondary | ICD-10-CM | POA: Insufficient documentation

## 2015-04-04 DIAGNOSIS — Z79899 Other long term (current) drug therapy: Secondary | ICD-10-CM | POA: Insufficient documentation

## 2015-04-04 DIAGNOSIS — M24849 Other specific joint derangements of unspecified hand, not elsewhere classified: Secondary | ICD-10-CM

## 2015-04-04 DIAGNOSIS — M79645 Pain in left finger(s): Secondary | ICD-10-CM | POA: Diagnosis not present

## 2015-04-04 DIAGNOSIS — Z791 Long term (current) use of non-steroidal anti-inflammatories (NSAID): Secondary | ICD-10-CM | POA: Diagnosis not present

## 2015-04-04 DIAGNOSIS — M79602 Pain in left arm: Secondary | ICD-10-CM | POA: Diagnosis not present

## 2015-04-04 DIAGNOSIS — R200 Anesthesia of skin: Secondary | ICD-10-CM | POA: Diagnosis not present

## 2015-04-04 MED ORDER — IBUPROFEN 600 MG PO TABS: 600.0000 mg | ORAL_TABLET | Freq: Four times a day (QID) | ORAL | Status: DC | PRN

## 2015-04-04 NOTE — ED Provider Notes (Signed)
CSN: [id number]     Arrival date & time 04/04/15  0957 History  This chart was scribed for non-physician practitioner, Caryl Ada, PA-C working with No att. providers found by Rayna Sexton, ED scribe. This patient was seen in room TR05C/TR05C and the patient's care was started at 10:27 AM.     No chief complaint on file.  The history is provided by the patient. No language interpreter was used.    HPI Comments: [name] is a 24 y.o. female who presents to the Emergency Department complaining of intermittent,  left arm numbness with onset 10 days ago. She notes a history of fracture to the affected arm that occurred 1 year prior. 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  She denies taking any current medications as well as currently being pregnant. She denies neck pain and any radiation of numbness to fingers.  PCP: Dr. Ruthann Cancer   Past Medical History  Diagnosis Date  . Seizures   . Seizures     "converted seizures", treated at [facility], non-epileptic    Past Surgical History  Procedure Laterality Date  . None    . Wisdom tooth extraction    . Mass excision N/A 09/13/2013    Procedure: EXCISION OF SOFT TISSUE MASS ON BACK;  Surgeon: Irene Limbo, MD;  Location: [facility];  Service: Plastics;  Laterality: N/A;   Family History  Problem Relation Age of Onset  . Hypertension Mother    History  Substance Use Topics  . Smoking status: Never Smoker   . Smokeless tobacco: Never Used  . Alcohol Use: No   OB History    Gravida Para Term Preterm AB TAB SAB Ectopic Multiple Living   2 2 2  0 0 0 0 0 0 2     Review of Systems  Musculoskeletal: Positive for myalgias. Negative for neck pain and neck stiffness.  Neurological: Positive for numbness.  All other systems reviewed and are negative.     Allergies   Review of patient's allergies indicates no known allergies.  Home Medications   Prior to Admission medications   Medication Sig Start Date End Date Taking? Authorizing Provider  ibuprofen (ADVIL,MOTRIN) 800 MG tablet Take 1 tablet (800 mg total) by mouth 3 (three) times daily. 12/29/14   Melony Overly, MD  ipratropium (ATROVENT) 0.06 % nasal spray Place 2 sprays into both nostrils 4 (four) times daily. 10/02/14   Harden Mo, MD  medroxyPROGESTERone (DEPO-PROVERA) 150 MG/ML injection Inject 150 mg into the muscle every 3 (three) months.    Historical Provider, MD  methocarbamol (ROBAXIN) 500 MG tablet Take 1 tablet (500 mg total) by mouth 2 (two) times daily. 07/07/14   Heather Laisure, PA-C  naproxen (NAPROSYN) 500 MG tablet Take 1 tablet (500 mg total) by mouth 2 (two) times daily. 07/07/14   Heather Laisure, PA-C  predniSONE (DELTASONE) 20 MG tablet Take 1 tablet (20 mg total) by mouth 2 (two) times daily. 10/02/14   Harden Mo, MD  SUMAtriptan (IMITREX) 100 MG tablet Take 1 tablet (100 mg total) by mouth every 2 (two) hours as needed for migraine or headache. May repeat in 2 hours if headache persists or recurs. 12/29/14   Melony Overly, MD  traMADol Veatrice Bourbon) 50 MG tablet 1 to 2 tablets every 8 hours as needed for cough 10/02/14   Shanon Brow  Collier Bullock, MD   There were no vitals taken for this visit. Physical Exam  Constitutional: She is oriented to person, place, and time. She appears well-developed and well-nourished. No distress.  HENT:  Head: Normocephalic and atraumatic.  Mouth/Throat: Oropharynx is clear and moist.  Eyes: Conjunctivae and EOM are normal. Pupils are equal, round, and reactive to light.  Neck: Normal range of motion. Neck supple. No tracheal deviation present.  Cardiovascular: Normal rate and regular rhythm.   Pulmonary/Chest: Breath sounds normal. No respiratory distress.  Abdominal: Soft.  Musculoskeletal: Normal range of motion.  Neurological: She is alert and  oriented to person, place, and time.  Skin: Skin is warm and dry.  Psychiatric: She has a normal mood and affect. Her behavior is normal.  Nursing note and vitals reviewed.   ED Course  Procedures  DIAGNOSTIC STUDIES: Oxygen Saturation is 96% on RA, normal by my interpretation.    COORDINATION OF CARE: 10:30 AM Discussed treatment plan with pt at bedside including a sling, a referral to an orthopedist, as well as a prescription for an antiinflammatory.  Pt agreed to plan.  Labs Review Labs Reviewed - No data to display  Imaging Review No results found.   EKG Interpretation None      MDM   Final diagnoses:  Thumb joint locking  Arm pain, left    Prednisone   I personally performed the services in this documentation, which was scribed in my presence.  The recorded information has been reviewed and considered.   Ronnald Collum.  Fransico Meadow, PA-C 04/04/15 [place], PA-C 04/04/15 [place], MD 04/05/15 [phone]

## 2015-04-04 NOTE — Discharge Instructions (Signed)

## 2015-04-04 NOTE — ED Notes (Signed)
Pt reports since last Saturday her left arm has been cramping. Sometime unable to straighten thumb during cramping episodes. Pt reports wrist fx same side last year. No recent injury. Pt NAD

## 2015-09-20 ENCOUNTER — Encounter (HOSPITAL_COMMUNITY): Payer: Self-pay | Admitting: *Deleted

## 2015-09-20 ENCOUNTER — Emergency Department (INDEPENDENT_AMBULATORY_CARE_PROVIDER_SITE_OTHER): Payer: Medicaid Other

## 2015-09-20 ENCOUNTER — Emergency Department (HOSPITAL_COMMUNITY)
Admission: EM | Admit: 2015-09-20 | Discharge: 2015-09-20 | Disposition: A | Discharge status: Home / Self Care | Payer: Medicaid Other | Source: Home / Self Care | Attending: Family Medicine | Admitting: Family Medicine

## 2015-09-20 DIAGNOSIS — M778 Other enthesopathies, not elsewhere classified: Secondary | ICD-10-CM | POA: Diagnosis not present

## 2015-09-20 MED ORDER — NAPROXEN 500 MG PO TABS: 500.0000 mg | ORAL_TABLET | Freq: Two times a day (BID) | ORAL | Status: DC

## 2015-09-20 NOTE — ED Provider Notes (Signed)
CSN: [id number]     Arrival date & time 09/20/15  1300 History   First MD Initiated Contact with Patient 09/20/15 1319     Chief Complaint  Patient presents with  . Wrist Pain   (Consider location/radiation/quality/duration/timing/severity/associated sxs/prior Treatment) Patient is a 24 y.o. female presenting with wrist pain. The history is provided by the patient.  Wrist Pain This is a recurrent problem. The current episode started more than 1 week ago. The problem has been gradually worsening. Associated symptoms comments: Extends up arm to shoulder on left..    Past Medical History  Diagnosis Date  . Seizures ([place])   . Seizures ([place])     "converted seizures", treated at [place], non-epileptic [facility])    Past Surgical History  Procedure Laterality Date  . None    . Wisdom tooth extraction    . Mass excision N/A 09/13/2013    Procedure: EXCISION OF SOFT TISSUE MASS ON BACK;  Surgeon: Irene Limbo, MD;  Location: [place];  Service: Plastics;  Laterality: N/A;   Family History  Problem Relation Age of Onset  . Hypertension Mother    Social History  Substance Use Topics  . Smoking status: Never Smoker   . Smokeless tobacco: Never Used  . Alcohol Use: No   OB History    Gravida Para Term Preterm AB TAB SAB Ectopic Multiple Living   2 2 2  0 0 0 0 0 0 2     Review of Systems  Musculoskeletal: Negative for back pain, joint swelling and neck pain.  Skin: Negative.   All other systems reviewed and are negative.   Allergies  Review of patient's allergies indicates no known allergies.  Home Medications   Prior to Admission medications   Medication Sig Start Date End Date Taking? Authorizing Provider  ibuprofen (ADVIL,MOTRIN) 600 MG tablet Take 1 tablet (600 mg total) by mouth every 6 (six) hours as needed. 04/04/15   Fransico Meadow, PA-C  ipratropium (ATROVENT) 0.06 % nasal spray Place 2 sprays into both nostrils 4 (four) times daily.  10/02/14   Harden Mo, MD  medroxyPROGESTERone (DEPO-PROVERA) 150 MG/ML injection Inject 150 mg into the muscle every 3 (three) months.    Historical Provider, MD  methocarbamol (ROBAXIN) 500 MG tablet Take 1 tablet (500 mg total) by mouth 2 (two) times daily. 07/07/14   Heather Laisure, PA-C  naproxen (NAPROSYN) 500 MG tablet Take 1 tablet (500 mg total) by mouth 2 (two) times daily. 09/20/15   Billy Fischer, MD  predniSONE (DELTASONE) 20 MG tablet Take 1 tablet (20 mg total) by mouth 2 (two) times daily. 10/02/14   Harden Mo, MD  SUMAtriptan (IMITREX) 100 MG tablet Take 1 tablet (100 mg total) by mouth every 2 (two) hours as needed for migraine or headache. May repeat in 2 hours if headache persists or recurs. 12/29/14   Melony Overly, MD  traMADol Veatrice Bourbon) 50 MG tablet 1 to 2 tablets every 8 hours as needed for cough 10/02/14   Harden Mo, MD   Meds Ordered and Administered this Visit  Medications - No data to display  There were no vitals taken for this visit. No data found.   Physical Exam  Constitutional: She is oriented to person, place, and time. She appears well-developed and well-nourished. No distress.  Neck: Normal range of motion. Neck supple.  Musculoskeletal: She exhibits tenderness.       Left wrist: She exhibits decreased range  of motion, tenderness and swelling. She exhibits no deformity.  Lymphadenopathy:    She has no cervical adenopathy.  Neurological: She is alert and oriented to person, place, and time.  Skin: Skin is warm and dry.  Nursing note and vitals reviewed.   ED Course  Procedures (including critical care time)  Labs Review Labs Reviewed - No data to display  Imaging Review Dg Wrist Complete Left  09/20/2015  CLINICAL DATA:  Per pt: pain in the left wrist started last week, fell in 12/15 fractured a bone. Patient pointed to the scaphoid and radial aspect, stated that pain and numbness radiate up the left arm. Patient hasn't had any recent  trauma, patient is not a diabetic EXAM: LEFT WRIST - COMPLETE 3+ VIEW COMPARISON:  07/08/2014 FINDINGS: There is no evidence of fracture or dislocation. There is no evidence of arthropathy or other focal bone abnormality. Soft tissues are unremarkable. IMPRESSION: Negative. Electronically Signed   By: Lajean Manes M.D.   On: 09/20/2015 14:09   X-rays reviewed and report per radiologist.   Visual Acuity Review  Right Eye Distance:   Left Eye Distance:   Bilateral Distance:    Right Eye Near:   Left Eye Near:    Bilateral Near:         MDM   1. Left wrist tendonitis        Billy Fischer, MD 09/20/15 2104

## 2015-09-20 NOTE — ED Notes (Signed)
Pt  Has  Pain  l  Wrist    For  About  1  Week    She  Reports  An old   Problem  With  The  Affected  Wrist  For  About  1  Year      Pt  denys  Any  specefic  injury

## 2015-09-20 NOTE — Discharge Instructions (Signed)
Ice, medicine and splint as needed, see orthopedist if further problems.

## 2016-02-26 DIAGNOSIS — Z3009 Encounter for other general counseling and advice on contraception: Secondary | ICD-10-CM | POA: Diagnosis not present

## 2016-04-23 ENCOUNTER — Ambulatory Visit (HOSPITAL_COMMUNITY)
Admission: EM | Admit: 2016-04-23 | Discharge: 2016-04-23 | Disposition: A | Discharge status: Home / Self Care | Payer: Medicaid Other | Attending: Emergency Medicine | Admitting: Emergency Medicine

## 2016-04-23 ENCOUNTER — Encounter (HOSPITAL_COMMUNITY): Payer: Self-pay | Admitting: *Deleted

## 2016-04-23 DIAGNOSIS — G8929 Other chronic pain: Secondary | ICD-10-CM

## 2016-04-23 DIAGNOSIS — M25532 Pain in left wrist: Secondary | ICD-10-CM | POA: Diagnosis not present

## 2016-04-23 MED ORDER — NAPROXEN 500 MG PO TABS: 500.0000 mg | ORAL_TABLET | Freq: Two times a day (BID) | ORAL | Status: DC

## 2016-04-23 NOTE — ED Notes (Signed)
Pt  Reports    Symptoms    Of l   Wrist    Pain/  Swelling     With  Onset yesterday      Pt denys  Any  specefic  Injury     Has  Been  Treated    For  Arthritis  In  Past

## 2016-04-23 NOTE — ED Provider Notes (Signed)
CSN: [id number]     Arrival date & time 04/23/16  1231 History   First MD Initiated Contact with Patient 04/23/16 1348     Chief Complaint  Patient presents with  . Wrist Pain   (Consider location/radiation/quality/duration/timing/severity/associated sxs/prior Treatment) HPI Comments: Patient presents with left wrist pain. Has history of left wrist joint pain, possible arthritis in the past. No previous injury. Has been treated with NSAIDS and wrist brace in the past with good success.   Patient is a 25 y.o. female presenting with wrist pain. The history is provided by the patient.  Wrist Pain This is a recurrent problem. The current episode started yesterday. The problem occurs constantly. The problem has not changed since onset.The symptoms are aggravated by twisting. Nothing relieves the symptoms. She has tried nothing for the symptoms. The treatment provided no relief.    Past Medical History  Diagnosis Date  . Seizures ([place])   . Seizures ([place])     "converted seizures", treated at [place], non-epileptic [facility])    Past Surgical History  Procedure Laterality Date  . None    . Wisdom tooth extraction    . Mass excision N/A 09/13/2013    Procedure: EXCISION OF SOFT TISSUE MASS ON BACK;  Surgeon: Irene Limbo, MD;  Location: [place];  Service: Plastics;  Laterality: N/A;   Family History  Problem Relation Age of Onset  . Hypertension Mother    Social History  Substance Use Topics  . Smoking status: Never Smoker   . Smokeless tobacco: Never Used  . Alcohol Use: No   OB History    Gravida Para Term Preterm AB TAB SAB Ectopic Multiple Living   2 2 2  0 0 0 0 0 0 2     Review of Systems  Constitutional: Negative for fever and fatigue.  Musculoskeletal: Positive for joint swelling.  Skin: Negative.     Allergies  Review of patient's allergies indicates no known allergies.  Home Medications   Prior to Admission medications   Medication  Sig Start Date End Date Taking? Authorizing Provider  ipratropium (ATROVENT) 0.06 % nasal spray Place 2 sprays into both nostrils 4 (four) times daily. 10/02/14   Harden Mo, MD  medroxyPROGESTERone (DEPO-PROVERA) 150 MG/ML injection Inject 150 mg into the muscle every 3 (three) months.    Historical Provider, MD  naproxen (NAPROSYN) 500 MG tablet Take 1 tablet (500 mg total) by mouth 2 (two) times daily. As needed for pain. 04/23/16   Katy Apo, NP  SUMAtriptan (IMITREX) 100 MG tablet Take 1 tablet (100 mg total) by mouth every 2 (two) hours as needed for migraine or headache. May repeat in 2 hours if headache persists or recurs. 12/29/14   Melony Overly, MD   Meds Ordered and Administered this Visit  Medications - No data to display  BP 129/66 mmHg  Pulse 77  Temp(Src) 99.4 F (37.4 C) (Oral)  Resp 12  SpO2 100% No data found.   Physical Exam  Constitutional: She is oriented to person, place, and time. She appears well-developed and well-nourished.  Musculoskeletal:       Left wrist: She exhibits tenderness and swelling. She exhibits normal range of motion and no deformity.  Left wrist tender along radial bone and nerve. Slight swelling present at base of thumb. Has full range of motion. Has some tinging in fingers when placing pressure on radial nerve. Pulses are normal and good capillary refill.   Neurological:  She is alert and oriented to person, place, and time.  Skin: Skin is warm and dry.    ED Course  Procedures (including critical care time)  Labs Review Labs Reviewed - No data to display  Imaging Review No results found.   Visual Acuity Review  Right Eye Distance:   Left Eye Distance:   Bilateral Distance:    Right Eye Near:   Left Eye Near:    Bilateral Near:         MDM   1. Wrist pain, chronic, left   Did not repeat x-ray since results were negative 6 months ago and no change in characteristics of pain.  Recommend restart Naproxen 500mg  twice  a day as needed. Left wrist splint provided. Try to limit repetitive motion of left hand/wrist. Recommend follow-up with her PCP if pain does not improve- may need to see an Orthopedic for further evaluation of recurrent pain.     Katy Apo, NP 04/24/16 [phone]

## 2016-04-23 NOTE — Discharge Instructions (Signed)
Start Naproxen twice a day as needed for pain and swelling. Follow-up with your primary care provider if not improving.   Joint Pain Joint pain, which is also called arthralgia, can be caused by many things. Joint pain often goes away when you follow your health care provider's instructions for relieving pain at home. However, joint pain can also be caused by conditions that require further treatment. Common causes of joint pain include:  Bruising in the area of the joint.  Overuse of the joint.  Wear and tear on the joints that occur with aging (osteoarthritis).  Various other forms of arthritis.  A buildup of a crystal form of uric acid in the joint (gout).  Infections of the joint (septic arthritis) or of the bone (osteomyelitis). Your health care provider may recommend medicine to help with the pain. If your joint pain continues, additional tests may be needed to diagnose your condition. HOME CARE INSTRUCTIONS Watch your condition for any changes. Follow these instructions as directed to lessen the pain that you are feeling.  Take medicines only as directed by your health care provider.  Rest the affected area for as long as your health care provider says that you should. If directed to do so, raise the painful joint above the level of your heart while you are sitting or lying down.  Do not do things that cause or worsen pain.  If directed, apply ice to the painful area:  Put ice in a plastic bag.  Place a towel between your skin and the bag.  Leave the ice on for 20 minutes, 2-3 times per day.  Wear an elastic bandage, splint, or sling as directed by your health care provider. Loosen the elastic bandage or splint if your fingers or toes become numb and tingle, or if they turn cold and blue.  Begin exercising or stretching the affected area as directed by your health care provider. Ask your health care provider what types of exercise are safe for you.  Keep all follow-up  visits as directed by your health care provider. This is important. SEEK MEDICAL CARE IF:  Your pain increases, and medicine does not help.  Your joint pain does not improve within 3 days.  You have increased bruising or swelling.  You have a fever.  You lose 10 lb (4.5 kg) or more without trying. SEEK IMMEDIATE MEDICAL CARE IF:  You are not able to move the joint.  Your fingers or toes become numb or they turn cold and blue.   This information is not intended to replace advice given to you by your health care provider. Make sure you discuss any questions you have with your health care provider.   Document Released: 10/06/2005 Document Revised: 10/27/2014 Document Reviewed: 07/18/2014 Elsevier Interactive Patient Education [employer].

## 2016-05-15 ENCOUNTER — Encounter: Payer: Self-pay | Admitting: Certified Nurse Midwife

## 2016-05-15 ENCOUNTER — Ambulatory Visit (INDEPENDENT_AMBULATORY_CARE_PROVIDER_SITE_OTHER): Payer: Medicaid Other | Admitting: Certified Nurse Midwife

## 2016-05-15 VITALS — BP 124/77 | HR 61 | Temp 100.2°F | Wt 194.0 lb

## 2016-05-15 DIAGNOSIS — Z3042 Encounter for surveillance of injectable contraceptive: Secondary | ICD-10-CM | POA: Diagnosis not present

## 2016-05-15 DIAGNOSIS — Z3009 Encounter for other general counseling and advice on contraception: Secondary | ICD-10-CM

## 2016-05-15 MED ORDER — MEDROXYPROGESTERONE ACETATE 150 MG/ML IM SUSP
150.0000 mg | Freq: Once | INTRAMUSCULAR | Status: AC
  Administered 2016-05-15: 150 mg via INTRAMUSCULAR

## 2016-05-15 NOTE — Progress Notes (Signed)
Subjective:    [name] is a 25 y.o. female who presents for contraception counseling. The patient has no complaints today. The patient is sexually active, homosexual relationship; uses depo injections for AUB. Pertinent past medical history: none.  Has been on Depo injections consistently.  Is due for her next injection today.  Due for annual exam in October.  Hx of abnormal pap smears.  Dr. Alton Revere chart reviewed.    The information documented in the HPI was reviewed and verified.  Menstrual History: OB History    Gravida Para Term Preterm AB Living   2 2 2  0 0 1   SAB TAB Ectopic Multiple Live Births   0 0 0 0 1       No LMP recorded. Patient has had an injection.   Patient Active Problem List   Diagnosis Date Noted  . Normal delivery 08/02/2011  . Seizures ([place]) 06/15/2011   Past Medical History:  Diagnosis Date  . 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 Seizures ([place])   . Seizures ([place])    "converted seizures", treated at [facility]    Past Surgical History:  Procedure Laterality Date  . MASS EXCISION N/A 09/13/2013   Procedure: EXCISION OF SOFT TISSUE MASS ON BACK;  Surgeon: Irene Limbo, MD;  Location: [place];  Service: Plastics;  Laterality: N/A;  . none    . WISDOM TOOTH EXTRACTION       Current Outpatient Prescriptions:  .  ipratropium (ATROVENT) 0.06 % nasal spray, Place 2 sprays into both nostrils 4 (four) times daily., Disp: 15 mL, Rfl: 12 .  medroxyPROGESTERone (DEPO-PROVERA) 150 MG/ML injection, Inject 150 mg into the muscle every 3 (three) months., Disp: , Rfl:  .  SUMAtriptan (IMITREX) 100 MG tablet, Take 1 tablet (100 mg total) by mouth every 2 (two) hours as needed for migraine or headache. May repeat in 2 hours if headache persists or recurs., Disp: 10 tablet, Rfl: 0 No Known Allergies  Social History  Substance Use Topics  . Smoking status: Never Smoker  . Smokeless tobacco: Never Used  . Alcohol use 2.4 oz/week    4 Shots  of liquor per week    History reviewed. No pertinent family history.     Review of Systems Constitutional: negative for weight loss Genitourinary:negative for abnormal menstrual periods and vaginal discharge   Objective:   BP 124/77   Pulse 61   Temp 100.2 F (37.9 C) (Oral)   Wt 194 lb (88 kg)   BMI 29.50 kg/m    General:   alert  Skin:   no rash or abnormalities  Lungs:   clear to auscultation bilaterally  Heart:   regular rate and rhythm, S1, S2 normal, no murmur, click, rub or gallop  Breasts:   deferred  Abdomen:  normal findings: no organomegaly, soft, non-tender and no hernia   Lab Review Urine pregnancy test Labs reviewed yes Radiologic studies reviewed no  50% of 30 min visit spent on counseling and coordination of care.   Assessment:    25 y.o., continuing Depo-Provera injections, no contraindications.   Plan:    All questions answered.  Meds ordered this encounter  Medications  . medroxyPROGESTERone (DEPO-PROVERA) injection 150 mg   No orders of the defined types were placed in this encounter.  Need to obtain previous records Follow up as needed.

## 2016-05-19 ENCOUNTER — Telehealth: Payer: Self-pay | Admitting: *Deleted

## 2016-05-19 NOTE — Telephone Encounter (Signed)
Patient is requesting a refill of the medication she is using for her headache.  Call to patient- she wants a Rx for Ibuprofen 800 mg. Told patient I would check with her provider and it should not be a problem to check with her pharmacy in a couple days.

## 2016-05-20 ENCOUNTER — Other Ambulatory Visit: Payer: Self-pay | Admitting: Certified Nurse Midwife

## 2016-05-20 MED ORDER — IBUPROFEN 800 MG PO TABS: 800.0000 mg | ORAL_TABLET | Freq: Three times a day (TID) | ORAL | 4 refills | Status: DC | PRN

## 2016-05-20 NOTE — Telephone Encounter (Signed)
Please let her know that a refill has been sent for the Motrin.  Thank you.  R.Massey Ruhland CNM

## 2016-06-08 ENCOUNTER — Emergency Department (HOSPITAL_COMMUNITY)
Admission: EM | Admit: 2016-06-08 | Discharge: 2016-06-08 | Disposition: A | Discharge status: Home / Self Care | Payer: Medicaid Other | Attending: Emergency Medicine | Admitting: Emergency Medicine

## 2016-06-08 ENCOUNTER — Emergency Department (HOSPITAL_COMMUNITY): Payer: Medicaid Other

## 2016-06-08 ENCOUNTER — Encounter (HOSPITAL_COMMUNITY): Payer: Self-pay | Admitting: Emergency Medicine

## 2016-06-08 DIAGNOSIS — Z791 Long term (current) use of non-steroidal anti-inflammatories (NSAID): Secondary | ICD-10-CM | POA: Insufficient documentation

## 2016-06-08 DIAGNOSIS — K5900 Constipation, unspecified: Secondary | ICD-10-CM

## 2016-06-08 LAB — POC URINE PREG, ED: PREG TEST UR: NEGATIVE

## 2016-06-08 MED ORDER — FLEET ENEMA 7-19 GM/118ML RE ENEM: 1.0000 | ENEMA | Freq: Once | RECTAL | 0 refills | Status: AC

## 2016-06-08 MED ORDER — BISACODYL 5 MG PO TBEC: 5.0000 mg | DELAYED_RELEASE_TABLET | Freq: Every day | ORAL | 0 refills | Status: DC | PRN

## 2016-06-08 MED ORDER — POLYETHYLENE GLYCOL 3350 17 G PO PACK: 17.0000 g | PACK | Freq: Every day | ORAL | 0 refills | Status: DC

## 2016-06-08 MED ORDER — DOCUSATE SODIUM 100 MG PO CAPS
100.0000 mg | ORAL_CAPSULE | Freq: Once | ORAL | Status: AC
  Administered 2016-06-08: 100 mg via ORAL
  Filled 2016-06-08: qty 1

## 2016-06-08 NOTE — Discharge Instructions (Signed)
Please read and follow all provided instructions.  Your diagnoses today include:  1. Constipation, unspecified constipation type     Tests performed today include: Vital signs. See below for your results today.   Medications prescribed:  Take as prescribed   Home care instructions:  Follow any educational materials contained in this packet.  Follow-up instructions: Please follow-up with your primary care provider for further evaluation of symptoms and treatment   Return instructions:  Please return to the Emergency Department if you do not get better, if you get worse, or new symptoms OR  - Fever (temperature greater than 101.82F)  - Bleeding that does not stop with holding pressure to the area    -Severe pain (please note that you may be more sore the day after your accident)  - Chest Pain  - Difficulty breathing  - Severe nausea or vomiting  - Inability to tolerate food and liquids  - Passing out  - Skin becoming red around your wounds  - Change in mental status (confusion or lethargy)  - New numbness or weakness    Please return if you have any other emergent concerns.  Additional Information:  Your vital signs today were: BP 119/70 (BP Location: Right Arm)    Pulse 79    Temp 98.4 F (36.9 C) (Oral)    Resp 17    SpO2 100%  If your blood pressure (BP) was elevated above 135/85 this visit, please have this repeated by your doctor within one month. --------------

## 2016-06-08 NOTE — ED Notes (Signed)
Discharge instructions, follow up care, and rx x3 reviewed with patient. Patient verbalized understanding. 

## 2016-06-08 NOTE — ED Notes (Signed)
Patient transported to X-ray 

## 2016-06-08 NOTE — ED Provider Notes (Signed)
[facility] DEPT Provider Note   CSN: [id number] Arrival date & time: 06/08/16  0935     History   Chief Complaint Chief Complaint  Patient presents with  . Constipation    HPI [name] is a 25 y.o. female.  HPI  25 y.o. female, presents to the Emergency Department today complaining of constipation for unknown duration of time. Pt states that she has been able to pass "rabbit turds" every once and a while, but has not had a full bowel movement. Pt able to pass flatus. No N/V/D. No CP/SOB/ABD pain. No vaginal bleeding/discharge. No dysuria. No fevers. Able to tolerate PO. No other symptoms noted.   Past Medical History:  Diagnosis Date  . 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 Seizures ([place])   . Seizures ([place])    "converted seizures", treated at [facility]    Patient Active Problem List   Diagnosis Date Noted  . Normal delivery 08/02/2011  . Seizures ([place]) 06/15/2011    Past Surgical History:  Procedure Laterality Date  . MASS EXCISION N/A 09/13/2013   Procedure: EXCISION OF SOFT TISSUE MASS ON BACK;  Surgeon: Irene Limbo, MD;  Location: [place];  Service: Plastics;  Laterality: N/A;  . none    . WISDOM TOOTH EXTRACTION      OB History    Gravida Para Term Preterm AB Living   2 2 2  0 0 1   SAB TAB Ectopic Multiple Live Births   0 0 0 0 1       Home Medications    Prior to Admission medications   Medication Sig Start Date End Date Taking? Authorizing Provider  ibuprofen (ADVIL,MOTRIN) 800 MG tablet Take 1 tablet (800 mg total) by mouth every 8 (eight) hours as needed. 05/20/16   Rachelle A Denney, CNM  ipratropium (ATROVENT) 0.06 % nasal spray Place 2 sprays into both nostrils 4 (four) times daily. 10/02/14   Harden Mo, MD  medroxyPROGESTERone (DEPO-PROVERA) 150 MG/ML injection Inject 150 mg into the muscle every 3 (three) months.    Historical Provider, MD  SUMAtriptan (IMITREX) 100 MG tablet Take 1 tablet (100 mg total) by  mouth every 2 (two) hours as needed for migraine or headache. May repeat in 2 hours if headache persists or recurs. 12/29/14   Melony Overly, MD    Family History No family history on file.  Social History Social History  Substance Use Topics  . Smoking status: Never Smoker  . Smokeless tobacco: Never Used  . Alcohol use 2.4 oz/week    4 Shots of liquor per week     Allergies   Review of patient's allergies indicates no known allergies.   Review of Systems Review of Systems ROS reviewed and all are negative for acute change except as noted in the HPI.  Physical Exam Updated Vital Signs BP 119/70 (BP Location: Right Arm)   Pulse 79   Temp 98.4 F (36.9 C) (Oral)   Resp 17   SpO2 100%   Physical Exam  Constitutional: She is oriented to person, place, and time. Vital signs are normal. She appears well-developed and well-nourished.  HENT:  Head: Normocephalic.  Right Ear: Hearing normal.  Left Ear: Hearing normal.  Eyes: Conjunctivae and EOM are normal. Pupils are equal, round, and reactive to light.  Neck: Normal range of motion. Neck supple.  Cardiovascular: Normal rate, regular rhythm, normal heart sounds and intact distal pulses.   Pulmonary/Chest: Effort normal and breath sounds normal.  Abdominal: Soft. Normal appearance and bowel sounds are normal. There is no tenderness. There is no rigidity, no rebound, no guarding, no CVA tenderness, no tenderness at McBurney's point and negative Murphy's sign.  Neurological: She is alert and oriented to person, place, and time.  Skin: Skin is warm and dry.  Psychiatric: She has a normal mood and affect. Her speech is normal and behavior is normal. Thought content normal.   ED Treatments / Results  Labs (all labs ordered are listed, but only abnormal results are displayed) Labs Reviewed - No data to display  EKG  EKG Interpretation None       Radiology Dg Abdomen Acute W/chest  Result Date: 06/08/2016 CLINICAL DATA:   Constipation EXAM: DG ABDOMEN ACUTE W/ 1V CHEST COMPARISON:  Chest radiograph July 08, 2014 FINDINGS: PA chest: Lungs clear. Heart size and pulmonary vascularity are normal. No adenopathy. Supine and upright abdomen: There is fairly diffuse stool throughout the colon. There is no bowel dilatation or air-fluid level to suggest obstruction. No free air. There is a tiny presumed phlebolith in the left pelvis. IMPRESSION: Fairly diffuse stool throughout colon consistent with a degree of constipation. No bowel obstruction or free air evident. Lungs clear. Electronically Signed   By: Lowella Grip III M.D.   On: 06/08/2016 11:15    Procedures Procedures (including critical care time)  Medications Ordered in ED Medications  docusate sodium (COLACE) capsule 100 mg (not administered)     Initial Impression / Assessment and Plan / ED Course  I have reviewed the triage vital signs and the nursing notes.  Pertinent labs & imaging results that were available during my care of the patient were reviewed by me and considered in my medical decision making (see chart for details).  Clinical Course    Final Clinical Impressions(s) / ED Diagnoses  I have reviewed and evaluated the relevant imaging studies.  I have reviewed the relevant previous healthcare records. I obtained HPI from historian.  ED Course:  Assessment: Pt is a 24yF who presents with constipation for unknown duration of time. Notes able to pass "rabbit turds." able to pass flatus. No N/V. Able to tolerate PO.. On exam, pt in NAD. Nontoxic/nonseptic appearing. VSS. Afebrile. Lungs CTA. Heart RRR. Abdomen nontender soft. DG Abdomen unremarkable for SBO. Did show evidence of constipation. Given docusate in ED. Plan is to DC home with docusate, counseled on enema at home as well as miralax. At time of discharge, Patient is in no acute distress. Vital Signs are stable. Patient is able to ambulate. Patient able to tolerate PO.    Disposition/Plan:  DC Home Additional Verbal discharge instructions given and discussed with patient.  Pt Instructed to f/u with PCP in the next week for evaluation and treatment of symptoms. Return precautions given Pt acknowledges and agrees with plan  Supervising Physician Dorie Rank, MD   Final diagnoses:  Constipation, unspecified constipation type    New Prescriptions New Prescriptions   No medications on file     Shary Decamp, PA-C 06/08/16 1127    Dorie Rank, MD 06/10/16 Drema Halon

## 2016-06-08 NOTE — ED Triage Notes (Signed)
Patient states that she is here for constipation. Patient unsure for how long since her last BM, she states "i have 1-2 rabbit terds and that's it".  States that she has tried laxative and mineral oil last night with no relief.

## 2016-07-14 ENCOUNTER — Encounter: Payer: Self-pay | Admitting: *Deleted

## 2016-07-14 LAB — PROCEDURE REPORT - SCANNED: Pap: ABNORMAL — AB

## 2016-08-07 ENCOUNTER — Other Ambulatory Visit: Payer: Self-pay | Admitting: Obstetrics

## 2016-08-07 ENCOUNTER — Other Ambulatory Visit: Payer: Self-pay | Admitting: *Deleted

## 2016-08-07 ENCOUNTER — Encounter: Payer: Self-pay | Admitting: Certified Nurse Midwife

## 2016-08-07 ENCOUNTER — Ambulatory Visit (INDEPENDENT_AMBULATORY_CARE_PROVIDER_SITE_OTHER): Payer: Medicaid Other | Admitting: Certified Nurse Midwife

## 2016-08-07 ENCOUNTER — Other Ambulatory Visit (HOSPITAL_COMMUNITY)
Admission: RE | Admit: 2016-08-07 | Discharge: 2016-08-07 | Disposition: A | Discharge status: Home / Self Care | Payer: Medicaid Other | Source: Ambulatory Visit | Attending: Certified Nurse Midwife | Admitting: Certified Nurse Midwife

## 2016-08-07 ENCOUNTER — Telehealth: Payer: Self-pay | Admitting: *Deleted

## 2016-08-07 VITALS — BP 114/78 | HR 85

## 2016-08-07 DIAGNOSIS — Z3042 Encounter for surveillance of injectable contraceptive: Secondary | ICD-10-CM

## 2016-08-07 DIAGNOSIS — N760 Acute vaginitis: Secondary | ICD-10-CM | POA: Insufficient documentation

## 2016-08-07 DIAGNOSIS — Z113 Encounter for screening for infections with a predominantly sexual mode of transmission: Secondary | ICD-10-CM | POA: Insufficient documentation

## 2016-08-07 DIAGNOSIS — Z01419 Encounter for gynecological examination (general) (routine) without abnormal findings: Secondary | ICD-10-CM | POA: Diagnosis not present

## 2016-08-07 MED ORDER — MEDROXYPROGESTERONE ACETATE 150 MG/ML IM SUSP: 150.0000 mg | INTRAMUSCULAR | 3 refills | Status: DC

## 2016-08-07 MED ORDER — MEDROXYPROGESTERONE ACETATE 150 MG/ML IM SUSP: 150.0000 mg | INTRAMUSCULAR | 4 refills | Status: DC

## 2016-08-07 MED ORDER — MEDROXYPROGESTERONE ACETATE 150 MG/ML IM SUSP
150.0000 mg | Freq: Once | INTRAMUSCULAR | Status: AC
  Administered 2016-08-07: 150 mg via INTRAMUSCULAR

## 2016-08-07 MED ORDER — PRENATE PIXIE 10-0.6-0.4-200 MG PO CAPS: 1.0000 | ORAL_CAPSULE | Freq: Every day | ORAL | 12 refills | Status: DC

## 2016-08-07 NOTE — Telephone Encounter (Signed)
PT NEEDS REFILL TO BE AUTHORIZED SO SHE CAN BRING IT WITH TO APPT.

## 2016-08-07 NOTE — Progress Notes (Signed)
Subjective:        [name] is a 25 y.o. female here for a routine exam.  The patient has no complaints today. The patient is sexually active, homosexual relationship; uses depo injections for AUB. Pertinent past medical history: none.  Has been on Depo injections consistently.  Is due for her next injection today.  Due for annual exam in October.  Hx of abnormal pap smears.  Dr. Alton Revere chart reviewed.  Has regular bra fittings. Declines blood STD testing today, desires swab.    Personal health questionnaire:  Is patient Ashkenazi Jewish, have a family history of breast and/or ovarian cancer: no Is there a family history of uterine cancer diagnosed at age < 57, gastrointestinal cancer, urinary tract cancer, family member who is a Field seismologist syndrome-associated carrier: no Is the patient overweight and hypertensive, family history of diabetes, personal history of gestational diabetes, preeclampsia or PCOS: no Is patient over 74, have PCOS,  family history of premature CHD under age 25, diabetes, smoke, have hypertension or peripheral artery disease:  yes At any time, has a partner hit, kicked or otherwise hurt or frightened you?: no Over the past 2 weeks, have you felt down, depressed or hopeless?: no Over the past 2 weeks, have you felt little interest or pleasure in doing things?:no   Gynecologic History No LMP recorded. Patient has had an injection. Contraception: Depo-Provera injections Last Pap: unknown. Results were: Hx of Cryotherapy, normal pap in 2016 Last mammogram: n/a.   Obstetric History OB History  Gravida Para Term Preterm AB Living  2 2 2  0 0 1  SAB TAB Ectopic Multiple Live Births  0 0 0 0 1    # Outcome Date GA Lbr Len/2nd Weight Sex Delivery Anes PTL Lv  2 Term 07/31/11 [redacted]w[redacted]d 04:00 / 00:12 7 lb 8.1 oz (3.405 kg) F Vag-Spont EPI  LIV     Birth Comments: none  1 Term               Past Medical History:  Diagnosis Date  . 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 Seizures ([place])   . Seizures ([place])    "converted seizures", treated at [facility]    Past Surgical History:  Procedure Laterality Date  . MASS EXCISION N/A 09/13/2013   Procedure: EXCISION OF SOFT TISSUE MASS ON BACK;  Surgeon: Irene Limbo, MD;  Location: [facility];  Service: Plastics;  Laterality: N/A;  . none    . WISDOM TOOTH EXTRACTION       Current Outpatient Prescriptions:  .  medroxyPROGESTERone (DEPO-PROVERA) 150 MG/ML injection, Inject 1 mL (150 mg total) into the muscle every 3 (three) months., Disp: 1 mL, Rfl: 3 .  bisacodyl (DULCOLAX) 5 MG EC tablet, Take 1 tablet (5 mg total) by mouth daily as needed for moderate constipation. (Patient not taking: Reported on 08/07/2016), Disp: 14 tablet, Rfl: 0 .  ibuprofen (ADVIL,MOTRIN) 800 MG tablet, Take 1 tablet (800 mg total) by mouth every 8 (eight) hours as needed. (Patient not taking: Reported on 08/07/2016), Disp: 60 tablet, Rfl: 4 .  ipratropium (ATROVENT) 0.06 % nasal spray, Place 2 sprays into both nostrils 4 (four) times daily. (Patient not taking: Reported on 08/07/2016), Disp: 15 mL, Rfl: 12 .  medroxyPROGESTERone (DEPO-PROVERA) 150 MG/ML injection, Inject 1 mL (150 mg total) into the muscle every 3 (three) months., Disp: 1 mL, Rfl: 4 .  polyethylene glycol (MIRALAX) packet, Take 17 g by mouth daily. (Patient not taking: Reported  on 08/07/2016), Disp: 14 each, Rfl: 0 .  Prenat-FeAsp-Meth-FA-DHA w/o A (PRENATE PIXIE) 10-0.6-0.4-200 MG CAPS, Take 1 tablet by mouth daily., Disp: 30 capsule, Rfl: 12 .  SUMAtriptan (IMITREX) 100 MG tablet, Take 1 tablet (100 mg total) by mouth every 2 (two) hours as needed for migraine or headache. May repeat in 2 hours if headache persists or recurs. (Patient not taking: Reported on 08/07/2016), Disp: 10 tablet, Rfl: 0 No Known Allergies  Social History  Substance Use Topics  . Smoking status: Never Smoker  . Smokeless tobacco: Never Used  . Alcohol use No    History  reviewed. No pertinent family history.    Review of Systems  Constitutional: negative for fatigue and weight loss Respiratory: negative for cough and wheezing Cardiovascular: negative for chest pain, fatigue and palpitations Gastrointestinal: negative for abdominal pain and change in bowel habits Musculoskeletal:negative for myalgias Neurological: negative for gait problems and tremors Behavioral/Psych: negative for abusive relationship, depression Endocrine: negative for temperature intolerance   Genitourinary:negative for abnormal menstrual periods, genital lesions, hot flashes, sexual problems and vaginal discharge Integument/breast: negative for breast lump, breast tenderness, nipple discharge and skin lesion(s)    Objective:       There were no vitals taken for this visit. General:   alert  Skin:   no rash or abnormalities  Lungs:   clear to auscultation bilaterally  Heart:   regular rate and rhythm, S1, S2 normal, no murmur, click, rub or gallop  Breasts:   normal without suspicious masses, skin or nipple changes or axillary nodes  Abdomen:  normal findings: no organomegaly, soft, non-tender and no hernia  Pelvis:  External genitalia: normal general appearance Urinary system: urethral meatus normal and bladder without fullness, nontender Vaginal: normal without tenderness, induration or masses Cervix: normal appearance Adnexa: normal bimanual exam Uterus: anteverted and non-tender, normal size   Lab Review Urine pregnancy test Labs reviewed yes Radiologic studies reviewed no  50% of 30 min visit spent on counseling and coordination of care.   Assessment:    Healthy female exam.   STD screening exam  Contraception management: Depo injections  Plan:    Education reviewed: calcium supplements, depression evaluation, low fat, low cholesterol diet, safe sex/STD prevention, self breast exams, skin cancer screening and weight bearing exercise. Contraception:  Depo-Provera injections. Follow up in: 1 year.   Meds ordered this encounter  Medications  . Prenat-FeAsp-Meth-FA-DHA w/o A (PRENATE PIXIE) 10-0.6-0.4-200 MG CAPS    Sig: Take 1 tablet by mouth daily.    Dispense:  30 capsule    Refill:  12    Please process coupon: Rx BIN: [id number], RxPCN: [id number], RxGRPNB:[id number], RxIDSV:[id number]  SUF: 01  . medroxyPROGESTERone (DEPO-PROVERA) 150 MG/ML injection    Sig: Inject 1 mL (150 mg total) into the muscle every 3 (three) months.    Dispense:  1 mL    Refill:  4   No orders of the defined types were placed in this encounter.  Need to obtain previous records Possible management options include: none Follow up as needed.

## 2016-08-07 NOTE — Telephone Encounter (Signed)
Rx sent to pharmacy for appointment today

## 2016-08-07 NOTE — Patient Instructions (Addendum)
Contraceptive Injection Information Contraceptive injections protect against pregnancy. Progesterone-only injections are given every 3 months to prevent pregnancy. These injections contain synthetic progesterone hormone. This synthetic progesterone hormone stops the ovaries from releasing eggs. It also thickens cervical mucus and changes the uterine lining. Your health care provider will make sure you are a good candidate for contraceptive injections. Discuss the possible side effects of the injection with your health care provider. ADVANTAGES  They are highly effective and reversible.  They slow down the flow of heavy menstrual periods.  They control cramps and painful menstrual periods.  Some women no longer get their period.  They are effective in preventing pregnancy when used correctly.  You are always protected from getting pregnant when you get the injection on time. DISADVANTAGES  They can be associated with weight gain and irregular bleeding.  They do not protect against sexually transmitted diseases (STDs).  You must visit your health care provider every 3 months.  The injections may be uncomfortable.  They may cost more than other methods of birth control.  It can take between 6 months and 2 years to be able to get pregnant (fertility).  They may also cause bone loss.   This information is not intended to replace advice given to you by your health care provider. Make sure you discuss any questions you have with your health care provider.   Document Released: 09/25/2011 Document Revised: 06/08/2013 Document Reviewed: 04/05/2013 Elsevier Interactive Patient Education [employer].

## 2016-08-07 NOTE — Addendum Note (Signed)
Addended by: Lewie Loron D on: 08/07/2016 11:43 AM   Modules accepted: Orders

## 2016-08-08 LAB — CERVICOVAGINAL ANCILLARY ONLY
Chlamydia: NEGATIVE
NEISSERIA GONORRHEA: NEGATIVE
Trichomonas: NEGATIVE

## 2016-08-12 ENCOUNTER — Other Ambulatory Visit: Payer: Self-pay | Admitting: Certified Nurse Midwife

## 2016-08-12 DIAGNOSIS — B9689 Other specified bacterial agents as the cause of diseases classified elsewhere: Secondary | ICD-10-CM

## 2016-08-12 DIAGNOSIS — N760 Acute vaginitis: Principal | ICD-10-CM

## 2016-08-12 LAB — CYTOLOGY - PAP: DIAGNOSIS: NEGATIVE

## 2016-08-12 MED ORDER — METRONIDAZOLE 500 MG PO TABS: 500.0000 mg | ORAL_TABLET | Freq: Two times a day (BID) | ORAL | 0 refills | Status: DC

## 2016-08-13 LAB — CERVICOVAGINAL ANCILLARY ONLY: CANDIDA VAGINITIS: NEGATIVE

## 2016-08-14 ENCOUNTER — Other Ambulatory Visit: Payer: Self-pay | Admitting: Certified Nurse Midwife

## 2016-08-15 ENCOUNTER — Telehealth: Payer: Self-pay | Admitting: *Deleted

## 2016-08-15 NOTE — Telephone Encounter (Signed)
Pt called to office asking for refill on ibuprofen 800mg . Return call to pt. Pt made aware she should have refills at pharmacy.  Pt advised to call pharmacy and check for refill. If any problems to call office.

## 2016-10-29 ENCOUNTER — Ambulatory Visit (INDEPENDENT_AMBULATORY_CARE_PROVIDER_SITE_OTHER): Payer: Medicaid Other | Admitting: *Deleted

## 2016-10-29 VITALS — BP 118/81 | HR 71 | Wt 194.2 lb

## 2016-10-29 DIAGNOSIS — Z3042 Encounter for surveillance of injectable contraceptive: Secondary | ICD-10-CM

## 2016-10-29 MED ORDER — MEDROXYPROGESTERONE ACETATE 150 MG/ML IM SUSP
150.0000 mg | Freq: Once | INTRAMUSCULAR | Status: AC
  Administered 2016-10-29: 150 mg via INTRAMUSCULAR

## 2016-12-15 DIAGNOSIS — H40033 Anatomical narrow angle, bilateral: Secondary | ICD-10-CM | POA: Diagnosis not present

## 2016-12-15 DIAGNOSIS — H16223 Keratoconjunctivitis sicca, not specified as Sjogren's, bilateral: Secondary | ICD-10-CM | POA: Diagnosis not present

## 2017-01-04 ENCOUNTER — Encounter (HOSPITAL_COMMUNITY): Payer: Self-pay

## 2017-01-04 ENCOUNTER — Emergency Department (HOSPITAL_COMMUNITY): Payer: Medicaid Other

## 2017-01-04 DIAGNOSIS — R079 Chest pain, unspecified: Secondary | ICD-10-CM | POA: Diagnosis present

## 2017-01-04 DIAGNOSIS — R0789 Other chest pain: Secondary | ICD-10-CM | POA: Insufficient documentation

## 2017-01-04 NOTE — ED Triage Notes (Signed)
Pt complaining of central chest pain and tightness x 1 week. Pt denies any radiation to arm, back or jaw. Pt denies any cough or fevers.

## 2017-01-05 ENCOUNTER — Emergency Department (HOSPITAL_COMMUNITY)
Admission: EM | Admit: 2017-01-05 | Discharge: 2017-01-05 | Disposition: A | Discharge status: Home / Self Care | Payer: Medicaid Other | Attending: Emergency Medicine | Admitting: Emergency Medicine

## 2017-01-05 ENCOUNTER — Emergency Department (HOSPITAL_COMMUNITY): Payer: Medicaid Other

## 2017-01-05 ENCOUNTER — Encounter (HOSPITAL_COMMUNITY): Payer: Self-pay

## 2017-01-05 DIAGNOSIS — R0789 Other chest pain: Secondary | ICD-10-CM

## 2017-01-05 LAB — CBC
HCT: 39.8 % (ref 36.0–46.0)
HEMOGLOBIN: 13 g/dL (ref 12.0–15.0)
MCH: 23.5 pg — AB (ref 26.0–34.0)
MCHC: 32.7 g/dL (ref 30.0–36.0)
MCV: 72 fL — ABNORMAL LOW (ref 78.0–100.0)
Platelets: 203 10*3/uL (ref 150–400)
RBC: 5.53 MIL/uL — AB (ref 3.87–5.11)
RDW: 14.6 % (ref 11.5–15.5)
WBC: 7.6 10*3/uL (ref 4.0–10.5)

## 2017-01-05 LAB — BASIC METABOLIC PANEL
ANION GAP: 12 (ref 5–15)
BUN: 8 mg/dL (ref 6–20)
CO2: 23 mmol/L (ref 22–32)
Calcium: 9.2 mg/dL (ref 8.9–10.3)
Chloride: 103 mmol/L (ref 101–111)
Creatinine, Ser: 0.8 mg/dL (ref 0.44–1.00)
GFR calc non Af Amer: >60 mL/min (ref >60)
Glucose, Bld: 97 mg/dL (ref 65–99)
Potassium: 3.6 mmol/L (ref 3.5–5.1)
SODIUM: 138 mmol/L (ref 135–145)

## 2017-01-05 LAB — I-STAT TROPONIN, ED: TROPONIN I, POC: 0.02 ng/mL (ref 0.00–0.08)

## 2017-01-05 LAB — POC URINE PREG, ED: PREG TEST UR: NEGATIVE

## 2017-01-05 MED ORDER — RANITIDINE HCL 150 MG PO CAPS: 150.0000 mg | ORAL_CAPSULE | Freq: Every day | ORAL | 0 refills | Status: DC

## 2017-01-05 MED ORDER — SODIUM CHLORIDE 0.9 % IV BOLUS (SEPSIS)
1000.0000 mL | Freq: Once | INTRAVENOUS | Status: AC
  Administered 2017-01-05: 1000 mL via INTRAVENOUS

## 2017-01-05 MED ORDER — IOPAMIDOL (ISOVUE-370) INJECTION 76%
INTRAVENOUS | Status: AC
  Administered 2017-01-05: 100 mL
  Filled 2017-01-05: qty 100

## 2017-01-05 NOTE — ED Notes (Signed)
Patient transported to CT 

## 2017-01-05 NOTE — ED Provider Notes (Signed)
[facility] DEPT Provider Note   CSN: [id number] Arrival date & time: 01/04/17  2332  By signing my name below, I, Oleh Genin, attest that this documentation has been prepared under the direction and in the presence of Orpah Greek, MD. Electronically Signed: Oleh Genin, Scribe. 01/05/17. 1:32 AM.   History   Chief Complaint Chief Complaint  Patient presents with  . Chest Pain    HPI [name] is a 26 y.o. female with history of seizure disorder and reported history of DVT in gestation in 2010 who presents to the ED for evaluation of chest pain. This patient states that for the last week she has experienced intermittent, central chest pain with dyspnea that was more severe tonight. At interview, she denies any palpitations or peripheral edema. She does have history of DVT. She is not taking exogenous hormonal therapies.   The history is provided by the patient. No language interpreter was used.  Chest Pain   This is a new problem. The current episode started more than 1 week ago. The problem occurs hourly. The problem has been gradually worsening. Associated with: random. Pain location: central. The pain is moderate. Quality: tight. The pain does not radiate. Associated symptoms include shortness of breath. Pertinent negatives include no lower extremity edema and no palpitations.    Past Medical History:  Diagnosis Date  . Convulsion, non-epileptic ([place])   . Seizures ([place])   . Seizures ([place])    "converted seizures", treated at [facility]    Patient Active Problem List   Diagnosis Date Noted  . Normal delivery 08/02/2011  . Seizures ([place]) 06/15/2011    Past Surgical History:  Procedure Laterality Date  . MASS EXCISION N/A 09/13/2013   Procedure: EXCISION OF SOFT TISSUE MASS ON BACK;  Surgeon: Irene Limbo, MD;  Location: [facility];  Service: Plastics;  Laterality: N/A;  . none    . WISDOM TOOTH EXTRACTION      OB History      Gravida Para Term Preterm AB Living   2 2 2  0 0 1   SAB TAB Ectopic Multiple Live Births   0 0 0 0 1       Home Medications    Prior to Admission medications   Medication Sig Start Date End Date Taking? Authorizing Provider  ibuprofen (ADVIL,MOTRIN) 800 MG tablet Take 1 tablet (800 mg total) by mouth every 8 (eight) hours as needed. Patient taking differently: Take 800 mg by mouth every 8 (eight) hours as needed for moderate pain.  05/20/16  Yes Rachelle A Denney, CNM  medroxyPROGESTERone (DEPO-PROVERA) 150 MG/ML injection Inject 1 mL (150 mg total) into the muscle every 3 (three) months. 08/07/16  Yes Rachelle A Denney, CNM  ranitidine (ZANTAC) 150 MG capsule Take 1 capsule (150 mg total) by mouth daily. 01/05/17   Orpah Greek, MD    Family History History reviewed. No pertinent family history.  Social History Social History  Substance Use Topics  . Smoking status: Never Smoker  . Smokeless tobacco: Never Used  . Alcohol use No     Allergies   Patient has no known allergies.   Review of Systems Review of Systems  Respiratory: Positive for shortness of breath.   Cardiovascular: Positive for chest pain. Negative for palpitations.  All other systems reviewed and are negative.    Physical Exam Updated Vital Signs BP 128/81   Pulse 77   Temp 99.2 F (37.3 C)   Resp 16  SpO2 99%   Physical Exam  Constitutional: She is oriented to person, place, and time. She appears well-developed and well-nourished. No distress.  HENT:  Head: Normocephalic and atraumatic.  Right Ear: Hearing normal.  Left Ear: Hearing normal.  Nose: Nose normal.  Mouth/Throat: Oropharynx is clear and moist and mucous membranes are normal.  Eyes: Conjunctivae and EOM are normal. Pupils are equal, round, and reactive to light.  Neck: Normal range of motion. Neck supple.  Cardiovascular: Regular rhythm, S1 normal and S2 normal.  Tachycardia present.  Exam reveals no gallop and no  friction rub.   No murmur heard. Pulmonary/Chest: Effort normal and breath sounds normal. No respiratory distress. She exhibits no tenderness.  Abdominal: Soft. Normal appearance and bowel sounds are normal. There is no hepatosplenomegaly. There is no tenderness. There is no rebound, no guarding, no tenderness at McBurney's point and negative Murphy's sign. No hernia.  Musculoskeletal: Normal range of motion.  Neurological: She is alert and oriented to person, place, and time. She has normal strength. No cranial nerve deficit or sensory deficit. Coordination normal. GCS eye subscore is 4. GCS verbal subscore is 5. GCS motor subscore is 6.  Skin: Skin is warm, dry and intact. No rash noted. No cyanosis.  Psychiatric: She has a normal mood and affect. Her speech is normal and behavior is normal. Thought content normal.  Nursing note and vitals reviewed.    ED Treatments / Results  Labs (all labs ordered are listed, but only abnormal results are displayed) Labs Reviewed  CBC - Abnormal; Notable for the following:       Result Value   RBC 5.53 (*)    MCV 72.0 (*)    MCH 23.5 (*)    All other components within normal limits  BASIC METABOLIC PANEL  I-STAT TROPOININ, ED  POC URINE PREG, ED    EKG  EKG Interpretation  Date/Time:  Sunday January 04 2017 23:36:19 EDT Ventricular Rate:  118 PR Interval:    QRS Duration: 76 QT Interval:  432 QTC Calculation: 605 R Axis:   78 Text Interpretation:  Critical Test Result: Long QTc Sinus tachycardia Septal infarct , age undetermined Prolonged QT Abnormal ECG Confirmed by Betsey Holiday  MD, Illene Sweeting [phone]) on 01/05/2017 1:19:52 AM       Radiology Dg Chest 2 View  Result Date: 01/04/2017 CLINICAL DATA:  26 year old female with chest pain. EXAM: CHEST  2 VIEW COMPARISON:  Chest radiograph dated 06/08/2016 FINDINGS: The heart size and mediastinal contours are within normal limits. Both lungs are clear. The visualized skeletal structures are  unremarkable. IMPRESSION: No active cardiopulmonary disease. Electronically Signed   By: Anner Crete M.D.   On: 01/04/2017 23:52   Ct Angio Chest Pe W Or Wo Contrast  Result Date: 01/05/2017 CLINICAL DATA:  Central chest pain and tightness EXAM: CT ANGIOGRAPHY CHEST WITH CONTRAST TECHNIQUE: Multidetector CT imaging of the chest was performed using the standard protocol during bolus administration of intravenous contrast. Multiplanar CT image reconstructions and MIPs were obtained to evaluate the vascular anatomy. CONTRAST:  100 mL Isovue 370 IV COMPARISON:  Chest radiograph 01/04/2017 FINDINGS: Cardiovascular: Contrast injection is sufficient to demonstrate satisfactory opacification of the pulmonary arteries to the segmental level. There is no pulmonary embolus. The main pulmonary artery is within normal limits for size. There is no CT evidence of acute right heart strain. The visualized aorta is normal. There is a normal 3-vessel arch branching pattern. Heart size is normal, without pericardial effusion. Mediastinum/Nodes: No mediastinal,  hilar or axillary lymphadenopathy. The visualized thyroid and thoracic esophageal course are unremarkable. Lungs/Pleura: No pulmonary nodules or masses. No pleural effusion or pneumothorax. No focal airspace consolidation. No focal pleural abnormality. Upper Abdomen: Contrast bolus timing is not optimized for evaluation of the abdominal organs. Within this limitation, the visualized organs of the upper abdomen are normal. Musculoskeletal: No chest wall abnormality. No acute or significant osseous findings. Review of the MIP images confirms the above findings. IMPRESSION: Normal CTA of the chest. No pulmonary embolus or acute aortic syndrome. Electronically Signed   By: Ulyses Jarred M.D.   On: 01/05/2017 03:44    Procedures Procedures (including critical care time)  Medications Ordered in ED Medications  sodium chloride 0.9 % bolus 1,000 mL (0 mLs Intravenous  Stopped 01/05/17 0252)  iopamidol (ISOVUE-370) 76 % injection (100 mLs  Contrast Given 01/05/17 0326)     Initial Impression / Assessment and Plan / ED Course  I have reviewed the triage vital signs and the nursing notes.  Pertinent labs & imaging results that were available during my care of the patient were reviewed by me and considered in my medical decision making (see chart for details).     Patient presents to the ER for evaluation of chest discomfort. Patient has been experiencing intermittent tightness in her chest associated with shortness of breath for a week or more. This is not exertional in nature. She does not have cardiac risk factors. She tells me she had a blood clot in her leg when she was pregnant years ago. She is not on estrogens currently. She was tachycardic at arrival. She therefore underwent CT angiography. No acute PE or other pathology is noted. Troponin negative. EKG unremarkable. Patient reassured, no further intervention necessary.  Final Clinical Impressions(s) / ED Diagnoses   Final diagnoses:  Atypical chest pain    New Prescriptions New Prescriptions   RANITIDINE (ZANTAC) 150 MG CAPSULE    Take 1 capsule (150 mg total) by mouth daily.  I personally performed the services described in this documentation, which was scribed in my presence. The recorded information has been reviewed and is accurate.    Orpah Greek, MD 01/05/17 [phone]

## 2017-01-20 ENCOUNTER — Ambulatory Visit (INDEPENDENT_AMBULATORY_CARE_PROVIDER_SITE_OTHER): Payer: Medicaid Other

## 2017-01-20 VITALS — BP 113/78 | HR 92

## 2017-01-20 DIAGNOSIS — Z3042 Encounter for surveillance of injectable contraceptive: Secondary | ICD-10-CM | POA: Diagnosis not present

## 2017-01-20 MED ORDER — MEDROXYPROGESTERONE ACETATE 150 MG/ML IM SUSP: 150.0000 mg | INTRAMUSCULAR | 0 refills | Status: DC

## 2017-01-20 MED ORDER — MEDROXYPROGESTERONE ACETATE 150 MG/ML IM SUSP
150.0000 mg | INTRAMUSCULAR | Status: DC
  Administered 2017-01-20 – 2021-08-05 (×8): 150 mg via INTRAMUSCULAR

## 2017-01-20 NOTE — Progress Notes (Signed)
Patient is in the office for depo injection, administered and pt tolerated well. .. Administrations This Visit    medroxyPROGESTERone (DEPO-PROVERA) injection 150 mg    Admin Date 01/20/2017 Action Given Dose 150 mg Route Intramuscular Administered By Hinton Lovely, RN

## 2017-02-12 ENCOUNTER — Telehealth: Payer: Self-pay | Admitting: Diagnostic Neuroimaging

## 2017-02-12 NOTE — Telephone Encounter (Signed)
Pt called stating that she is aware that it has been years since she has been seen but now she is wanting to live on her own with in home care and her landlord is advising her there is a form that she would need to fax into Dr Leta Baptist to fill out just confirming that he has at one time treated her.  Pt would like a call back to know if the form could be filled out for her.  Please call [phone]

## 2017-02-12 NOTE — Telephone Encounter (Signed)
Patient has non-epileptic spells. I was managing migraine headaches, last visit in 2013, and then she did not follow up.  I recommend that she establish with PCP and seek aid / services through them.   -VRP

## 2017-02-12 NOTE — Telephone Encounter (Signed)
LMVM for pt to return call.  Form ? What type questions.  Last seen 10-06-12 for headaches/ Sz (small possibilty).  If filling out would need appt.

## 2017-02-12 NOTE — Telephone Encounter (Signed)
Pt has called back in to speak with you, she says she will be available until 2:15 if you can call her back before then please

## 2017-02-12 NOTE — Telephone Encounter (Signed)
Spoke to pt and relayed that she did not have sz but non epileptic spells.  Dr. Leta Baptist was managing migraines.  Recommended seeking care with pcp and she verbalized understanding.

## 2017-02-12 NOTE — Telephone Encounter (Signed)
I spoke to pt and she stated that she is now living with her 2 daughters in an apartment on her own.  Her landlord is asking that her physician who see's her for seizures write a note or fill in p/w that she has sz.  This would qualify her for a live in care (who could be a family member).  I relayed that we had not seen her for over 3 yrs.  She would need to come in for an appt.   She states that she is not taking any sz meds.  She does have spells 2-3 / month.  Last time seen in ED for chest pain, she stated she had a fall and Sz?  I did not see this in ED notes.

## 2017-03-12 ENCOUNTER — Encounter (HOSPITAL_COMMUNITY): Payer: Self-pay

## 2017-03-12 ENCOUNTER — Emergency Department (HOSPITAL_COMMUNITY)
Admission: EM | Admit: 2017-03-12 | Discharge: 2017-03-12 | Disposition: A | Discharge status: Home / Self Care | Payer: Medicaid Other | Attending: Emergency Medicine | Admitting: Emergency Medicine

## 2017-03-12 ENCOUNTER — Emergency Department (HOSPITAL_COMMUNITY): Payer: Medicaid Other

## 2017-03-12 DIAGNOSIS — R0602 Shortness of breath: Secondary | ICD-10-CM | POA: Insufficient documentation

## 2017-03-12 DIAGNOSIS — Z79899 Other long term (current) drug therapy: Secondary | ICD-10-CM | POA: Diagnosis not present

## 2017-03-12 DIAGNOSIS — R079 Chest pain, unspecified: Secondary | ICD-10-CM

## 2017-03-12 DIAGNOSIS — R072 Precordial pain: Secondary | ICD-10-CM | POA: Diagnosis not present

## 2017-03-12 LAB — CBC
HEMATOCRIT: 39.3 % (ref 36.0–46.0)
HEMOGLOBIN: 12.7 g/dL (ref 12.0–15.0)
MCH: 23.3 pg — ABNORMAL LOW (ref 26.0–34.0)
MCHC: 32.3 g/dL (ref 30.0–36.0)
MCV: 72 fL — ABNORMAL LOW (ref 78.0–100.0)
Platelets: 208 10*3/uL (ref 150–400)
RBC: 5.46 MIL/uL — AB (ref 3.87–5.11)
RDW: 13.9 % (ref 11.5–15.5)
WBC: 5.5 10*3/uL (ref 4.0–10.5)

## 2017-03-12 LAB — I-STAT TROPONIN, ED
TROPONIN I, POC: 0 ng/mL (ref 0.00–0.08)
Troponin i, poc: 0 ng/mL (ref 0.00–0.08)

## 2017-03-12 LAB — BASIC METABOLIC PANEL
ANION GAP: 8 (ref 5–15)
BUN: 7 mg/dL (ref 6–20)
CO2: 23 mmol/L (ref 22–32)
Calcium: 8.9 mg/dL (ref 8.9–10.3)
Chloride: 109 mmol/L (ref 101–111)
Creatinine, Ser: 0.77 mg/dL (ref 0.44–1.00)
GFR calc non Af Amer: >60 mL/min (ref >60)
GLUCOSE: 96 mg/dL (ref 65–99)
Potassium: 4 mmol/L (ref 3.5–5.1)
Sodium: 140 mmol/L (ref 135–145)

## 2017-03-12 LAB — POC URINE PREG, ED: PREG TEST UR: NEGATIVE

## 2017-03-12 MED ORDER — ASPIRIN 81 MG PO CHEW
324.0000 mg | CHEWABLE_TABLET | Freq: Once | ORAL | Status: AC
  Administered 2017-03-12: 324 mg via ORAL
  Filled 2017-03-12: qty 4

## 2017-03-12 MED ORDER — RANITIDINE HCL 150 MG PO CAPS: 150.0000 mg | ORAL_CAPSULE | Freq: Every day | ORAL | 0 refills | Status: DC

## 2017-03-12 NOTE — ED Provider Notes (Signed)
[facility] DEPT Provider Note   CSN: [id number] Arrival date & time: 03/12/17  1042     History   Chief Complaint Chief Complaint  Patient presents with  . Chest Pain    HPI [name] is a 26 y.o. female.  The history is provided by the patient.  Chest Pain   This is a new problem. The current episode started 3 to 5 hours ago. The problem occurs rarely. The problem has been gradually improving. Associated with: taking a hot shower this morning. The pain is present in the substernal region. The pain is mild. The quality of the pain is described as heavy and brief. The pain does not radiate (but does report of bilateraly arm and leg swelling that she attributes to her arthritis). Associated symptoms include shortness of breath. Pertinent negatives include no abdominal pain, no cough, no dizziness, no fever, no nausea, no syncope and no vomiting. Associated symptoms comments: Clammy feeling. She has tried nothing for the symptoms. Risk factors include lack of exercise.  Pertinent negatives for family medical history include: no CAD.    Past Medical History:  Diagnosis Date  . 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 Seizures ([place])   . Seizures ([place])    "converted seizures", treated at [facility]    Patient Active Problem List   Diagnosis Date Noted  . Normal delivery 08/02/2011  . Seizures ([place]) 06/15/2011    Past Surgical History:  Procedure Laterality Date  . MASS EXCISION N/A 09/13/2013   Procedure: EXCISION OF SOFT TISSUE MASS ON BACK;  Surgeon: Irene Limbo, MD;  Location: [facility];  Service: Plastics;  Laterality: N/A;  . none    . WISDOM TOOTH EXTRACTION      OB History    Gravida Para Term Preterm AB Living   2 2 2  0 0 1   SAB TAB Ectopic Multiple Live Births   0 0 0 0 1       Home Medications    Prior to Admission medications   Medication Sig Start Date End Date Taking? Authorizing Provider  ibuprofen (ADVIL,MOTRIN) 800 MG  tablet Take 1 tablet (800 mg total) by mouth every 8 (eight) hours as needed. 05/20/16  Yes Denney, Rachelle A, CNM  medroxyPROGESTERone (DEPO-PROVERA) 150 MG/ML injection Inject 1 mL (150 mg total) into the muscle every 3 (three) months. 01/20/17  Yes Denney, Rachelle A, CNM  Multiple Vitamin (MULTIVITAMIN) tablet Take 1 tablet by mouth daily.   Yes [provider]  ranitidine (ZANTAC) 150 MG capsule Take 1 capsule (150 mg total) by mouth daily. 03/12/17   Gareth Morgan, MD    Family History No family history on file.  Social History Social History  Substance Use Topics  . Smoking status: Never Smoker  . Smokeless tobacco: Never Used  . Alcohol use No     Allergies   Patient has no known allergies.   Review of Systems Review of Systems  Constitutional: Negative for fever.  HENT: Negative.   Respiratory: Positive for shortness of breath. Negative for cough.   Cardiovascular: Positive for chest pain. Negative for syncope.  Gastrointestinal: Negative for abdominal pain, nausea and vomiting.  Genitourinary: Negative.   Neurological: Negative for dizziness.  All other systems reviewed and are negative.    Physical Exam Updated Vital Signs BP 103/63   Pulse 78   Temp 98.5 F (36.9 C) (Oral)   Resp (!) 22   Ht 5\' 9"  (1.753 m)   Wt 86.2  kg (190 lb)   LMP  (LMP Unknown) Comment: DEPO  SpO2 98%   BMI 28.06 kg/m   Physical Exam  Constitutional: She is oriented to person, place, and time. She appears well-developed and well-nourished. No distress.  HENT:  Head: Normocephalic and atraumatic.  Mouth/Throat: Oropharynx is clear and moist.  Eyes: Conjunctivae are normal. Pupils are equal, round, and reactive to light.  Neck: Normal range of motion. Neck supple. No tracheal deviation present.  Cardiovascular: Normal rate, regular rhythm, normal heart sounds and intact distal pulses.   No murmur heard. Pulmonary/Chest: Effort normal and breath sounds normal. No  respiratory distress. She exhibits tenderness (reproducible chest pain).  Abdominal: Soft. There is no tenderness. There is no guarding.  Musculoskeletal: She exhibits no edema, tenderness or deformity.  Neurological: She is alert and oriented to person, place, and time. No cranial nerve deficit. She exhibits normal muscle tone. Coordination normal.  Skin: Skin is warm and dry.  Psychiatric: She has a normal mood and affect.  Nursing note and vitals reviewed.    ED Treatments / Results  Labs (all labs ordered are listed, but only abnormal results are displayed) Labs Reviewed  CBC - Abnormal; Notable for the following:       Result Value   RBC 5.46 (*)    MCV 72.0 (*)    MCH 23.3 (*)    All other components within normal limits  BASIC METABOLIC PANEL  I-STAT TROPOININ, ED  POC URINE PREG, ED  I-STAT TROPOININ, ED    EKG  EKG Interpretation  Date/Time:  Thursday Mar 12 2017 10:47:26 EDT Ventricular Rate:  88 PR Interval:  160 QRS Duration: 78 QT Interval:  352 QTC Calculation: 425 R Axis:   80 Text Interpretation:  Normal sinus rhythm Normal ECG minimal ST elevation aVL, wandering baseline, overall similar to prior No significant change since last tracing Confirmed by [facility] MD, ERIN ([id number]) on 03/12/2017 11:10:34 AM       Radiology Dg Chest 2 View  Result Date: 03/12/2017 CLINICAL DATA:  Chest pain, shortness of Breath EXAM: CHEST  2 VIEW COMPARISON:  01/04/2017 FINDINGS: Heart and mediastinal contours are within normal limits. No focal opacities or effusions. No acute bony abnormality. IMPRESSION: No active cardiopulmonary disease. Electronically Signed   By: Rolm Baptise M.D.   On: 03/12/2017 11:53    Procedures Procedures (including critical care time)  Medications Ordered in ED Medications  aspirin chewable tablet 324 mg (324 mg Oral Given 03/12/17 1259)     Initial Impression / Assessment and Plan / ED Course  I have reviewed the triage vital signs and  the nursing notes.  Pertinent labs & imaging results that were available during my care of the patient were reviewed by me and considered in my medical decision making (see chart for details).     25yoF with no sig pmhx here with feelings of chest pain, clammy, and some sob after taking a shower. Pain has been improving since then but has happened in the past which she has sought care for and "they never tell me anything." Vitals reassuring and exam as above. Trop neg and ekg without acute ischemia, wellens, or brugada. cxr and labs reassuring. Doubt pe as low risk per wells/perc. Low risk per heart score so will obtain delta trop.  Delta trop neg. Pt pain resolved while here.  I have reviewed all workup. Patient stable for discharge home.  I have reviewed all results with the patient. Advised to f/u  with a pcp for further management and evaluation. Patient agrees to stated plan. All questions answered. Advised to call or return to have any questions, new symptoms, change in symptoms, or symptoms that they do not understand.   Final Clinical Impressions(s) / ED Diagnoses   Final diagnoses:  Chest pain, unspecified type    New Prescriptions Discharge Medication List as of 03/12/2017  3:11 PM       Heriberto Antigua, MD 03/12/17 2112    Gareth Morgan, MD 03/13/17 8588

## 2017-03-12 NOTE — ED Triage Notes (Signed)
Per Pt, Pt is coming from home with complaints of mid-center chest pain that started this morning while in the shower. Pt reports slight SOB. Denied N/V or dizziness. Pt alert and oriented. Hx of Arthritis. Complaints of bilateral swelling in arms and legs.

## 2017-04-09 ENCOUNTER — Ambulatory Visit: Payer: Medicaid Other

## 2017-04-15 ENCOUNTER — Ambulatory Visit (INDEPENDENT_AMBULATORY_CARE_PROVIDER_SITE_OTHER): Payer: Medicaid Other

## 2017-04-15 DIAGNOSIS — Z3042 Encounter for surveillance of injectable contraceptive: Secondary | ICD-10-CM | POA: Diagnosis not present

## 2017-04-15 NOTE — Progress Notes (Signed)
Patient presents for DEPO. Given in Left deltoid. Tolerated well.  Next Injection 9/12-26/2018  Administrations This Visit    medroxyPROGESTERone (DEPO-PROVERA) injection 150 mg    Admin Date 04/15/2017 Action Given Dose 150 mg Route Intramuscular Administered By Tamela Oddi, RMA

## 2017-06-14 ENCOUNTER — Ambulatory Visit (HOSPITAL_COMMUNITY)
Admission: EM | Admit: 2017-06-14 | Discharge: 2017-06-14 | Disposition: A | Discharge status: Home / Self Care | Payer: Medicaid Other | Attending: Family Medicine | Admitting: Family Medicine

## 2017-06-14 ENCOUNTER — Encounter (HOSPITAL_COMMUNITY): Payer: Self-pay | Admitting: Emergency Medicine

## 2017-06-14 DIAGNOSIS — M654 Radial styloid tenosynovitis [de Quervain]: Secondary | ICD-10-CM

## 2017-06-14 DIAGNOSIS — M2550 Pain in unspecified joint: Secondary | ICD-10-CM

## 2017-06-14 MED ORDER — IBUPROFEN 800 MG PO TABS: 800.0000 mg | ORAL_TABLET | Freq: Three times a day (TID) | ORAL | 0 refills | Status: DC

## 2017-06-14 NOTE — ED Provider Notes (Signed)
  [facility]   [id number] 06/14/17 Arrival Time: 0388  ASSESSMENT & PLAN:  1. De Quervain's tenosynovitis, left   2. Arthralgia, unspecified joint     Meds ordered this encounter  Medications  . ibuprofen (ADVIL,MOTRIN) 800 MG tablet    Sig: Take 1 tablet (800 mg total) by mouth 3 (three) times daily.    Dispense:  30 tablet    Refill:  0    Order Specific Question:   Supervising Provider    Answer:   Sherlene Shams [[id number]]    Reviewed expectations re: course of current medical issues. Questions answered. Outlined signs and symptoms indicating need for more acute intervention. Patient verbalized understanding. After Visit Summary given.   SUBJECTIVE:  [name] is a 26 y.o. female who presents with complaint of right hand discomfort and left wrist and hand discomfort.  ROS: As per HPI.   OBJECTIVE:  Vitals:   06/14/17 1243  BP: 134/81  Pulse: 88  Resp: 18  Temp: 98.8 F (37.1 C)  TempSrc: Oral  SpO2: 99%     General appearance: alert; no distress Eyes: PERRLA; EOMI; conjunctiva normal HENT: normocephalic; atraumatic;Lungs: clear to auscultation bilaterally Heart: regular rate and rhythm Abdomen: soft, non-tender; bowel sounds normal; no masses or organomegaly; no guarding or rebound tenderness Extremities: TTP right hand negative finklestein,  TTP left hand and positive finklestein Skin: warm and dry Neurologic: normal gait; normal symmetric reflexes Psychological: alert and cooperative; normal mood and affect  Past Medical History:  Diagnosis Date  . 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 Seizures ([place])   . 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    Labs Reviewed - No data to display  Imaging: No results found.  No Known Allergies  History reviewed. No pertinent family history. Past Surgical History:    Procedure Laterality Date  . MASS EXCISION N/A 09/13/2013   Procedure: EXCISION OF SOFT TISSUE MASS ON BACK;  Surgeon: Irene Limbo, MD;  Location: [facility];  Service: Plastics;  Laterality: N/A;  . none    . WISDOM TOOTH EXTRACTION           Lysbeth Penner, [place] 06/14/17 [phone]

## 2017-06-14 NOTE — ED Triage Notes (Signed)
The patient presented to the [facility] with a complaint of left hand and wrist pain and numbness in her fingers.

## 2017-06-15 ENCOUNTER — Telehealth (HOSPITAL_COMMUNITY): Payer: Self-pay | Admitting: Emergency Medicine

## 2017-06-15 NOTE — Telephone Encounter (Signed)
Pt called needing her work note changed ... sts Bill put her out until 8/29  Pt sts she works in a [employer] and told her they might not be able to hold her job.   Pt wants to know if we can change note and have her return tomorrow 8/28.   Per Dr. Mannie Stabile, if pt is feeling better, ok to change not  Notified pt that note would be left at front w/front staff.   Pt verb understanding.

## 2017-07-01 ENCOUNTER — Ambulatory Visit (INDEPENDENT_AMBULATORY_CARE_PROVIDER_SITE_OTHER): Payer: Medicaid Other

## 2017-07-01 DIAGNOSIS — Z3042 Encounter for surveillance of injectable contraceptive: Secondary | ICD-10-CM | POA: Diagnosis not present

## 2017-07-01 MED ORDER — MEDROXYPROGESTERONE ACETATE 150 MG/ML IM SUSP
150.0000 mg | Freq: Once | INTRAMUSCULAR | Status: AC
  Administered 2017-07-01: 150 mg via INTRAMUSCULAR

## 2017-07-01 NOTE — Progress Notes (Signed)
Nurse visit for pt supplied Depo. Pt is on time for inj. Depo given R del w/o difficulty. Next Depo due Dec 4th. Pap due 07/2017. Pt will schedule Annual at checkout.

## 2017-07-17 ENCOUNTER — Encounter (HOSPITAL_COMMUNITY): Payer: Self-pay | Admitting: Emergency Medicine

## 2017-07-17 ENCOUNTER — Emergency Department (HOSPITAL_COMMUNITY): Payer: Medicaid Other

## 2017-07-17 DIAGNOSIS — Y929 Unspecified place or not applicable: Secondary | ICD-10-CM | POA: Insufficient documentation

## 2017-07-17 DIAGNOSIS — S90122A Contusion of left lesser toe(s) without damage to nail, initial encounter: Secondary | ICD-10-CM | POA: Insufficient documentation

## 2017-07-17 DIAGNOSIS — Z79899 Other long term (current) drug therapy: Secondary | ICD-10-CM | POA: Insufficient documentation

## 2017-07-17 DIAGNOSIS — Y939 Activity, unspecified: Secondary | ICD-10-CM | POA: Insufficient documentation

## 2017-07-17 DIAGNOSIS — W2209XA Striking against other stationary object, initial encounter: Secondary | ICD-10-CM | POA: Insufficient documentation

## 2017-07-17 DIAGNOSIS — Y999 Unspecified external cause status: Secondary | ICD-10-CM | POA: Insufficient documentation

## 2017-07-17 NOTE — ED Triage Notes (Signed)
Pt states she stubbed her 2 nd toe on her left foot  Pt has swelling noted at the base of the toe

## 2017-07-18 ENCOUNTER — Emergency Department (HOSPITAL_COMMUNITY)
Admission: EM | Admit: 2017-07-18 | Discharge: 2017-07-18 | Disposition: A | Discharge status: Home / Self Care | Payer: Medicaid Other | Attending: Emergency Medicine | Admitting: Emergency Medicine

## 2017-07-18 DIAGNOSIS — S90122A Contusion of left lesser toe(s) without damage to nail, initial encounter: Secondary | ICD-10-CM

## 2017-07-18 NOTE — Discharge Instructions (Signed)
Take Ibuprofen and/or Tylenol for pain

## 2017-07-18 NOTE — ED Provider Notes (Signed)
[facility] DEPT Provider Note   CSN: [id number] Arrival date & time: 07/17/17  2156     History   Chief Complaint Chief Complaint  Patient presents with  . Foot Pain    HPI [name] is a 26 y.o. female who presents with left 2nd toe pain. She states that she stubbed it on a door earlier this evening. She reports associated swelling. The pain is constant. No pain in the other toes or foot. She has been able to walk but it hurts. No meds PTA.  HPI  Past Medical History:  Diagnosis Date  . 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 Seizures ([place])   . Seizures ([place])    "converted seizures", treated at [facility]    Patient Active Problem List   Diagnosis Date Noted  . Normal delivery 08/02/2011  . Seizures ([place]) 06/15/2011    Past Surgical History:  Procedure Laterality Date  . MASS EXCISION N/A 09/13/2013   Procedure: EXCISION OF SOFT TISSUE MASS ON BACK;  Surgeon: Irene Limbo, MD;  Location: [facility];  Service: Plastics;  Laterality: N/A;  . none    . WISDOM TOOTH EXTRACTION      OB History    Gravida Para Term Preterm AB Living   2 2 2  0 0 1   SAB TAB Ectopic Multiple Live Births   0 0 0 0 1       Home Medications    Prior to Admission medications   Medication Sig Start Date End Date Taking? Authorizing Provider  ibuprofen (ADVIL,MOTRIN) 800 MG tablet Take 1 tablet (800 mg total) by mouth 3 (three) times daily. Patient not taking: Reported on 07/01/2017 06/14/17   Lysbeth Penner, FNP  medroxyPROGESTERone (DEPO-PROVERA) 150 MG/ML injection Inject 1 mL (150 mg total) into the muscle every 3 (three) months. 01/20/17   Morene Crocker, CNM    Family History Family History  Problem Relation Age of Onset  . Hypertension Other   . CAD Other     Social History Social History  Substance Use Topics  . Smoking status: Never Smoker  . Smokeless tobacco: Never Used  . Alcohol use No     Allergies   Patient has no known  allergies.   Review of Systems Review of Systems  Musculoskeletal: Positive for arthralgias and gait problem.  Neurological: Negative for numbness.     Physical Exam Updated Vital Signs BP 123/77 (BP Location: Left Arm)   Pulse 88   Temp 98.7 F (37.1 C) (Oral)   Resp 20   SpO2 99%   Physical Exam  Constitutional: She is oriented to person, place, and time. She appears well-developed and well-nourished. No distress.  HENT:  Head: Normocephalic and atraumatic.  Eyes: Pupils are equal, round, and reactive to light. Conjunctivae are normal. Right eye exhibits no discharge. Left eye exhibits no discharge. No scleral icterus.  Neck: Normal range of motion.  Cardiovascular: Normal rate.   Pulmonary/Chest: Effort normal. No respiratory distress.  Abdominal: She exhibits no distension.  Musculoskeletal:  Left foot: No obvious swelling, deformity, or warmth. Tenderness to palpation of base of 2nd toe. N/V intact. Ambulatory   Neurological: She is alert and oriented to person, place, and time.  Skin: Skin is warm and dry.  Psychiatric: She has a normal mood and affect. Her behavior is normal.  Nursing note and vitals reviewed.    ED Treatments / Results  Labs (all labs ordered are listed, but only abnormal results are  displayed) Labs Reviewed - No data to display  EKG  EKG Interpretation None       Radiology Dg Foot Complete Left  Result Date: 07/17/2017 CLINICAL DATA:  Left foot pain. Stubbed second toe in her house tonight. EXAM: LEFT FOOT - COMPLETE 3+ VIEW COMPARISON:  None. FINDINGS: There is no evidence of fracture or dislocation. There is no evidence of arthropathy or other focal bone abnormality. Soft tissues are unremarkable. IMPRESSION: Negative radiographs of the left foot with special attention to the second toe. Electronically Signed   By: Jeb Levering M.D.   On: 07/17/2017 23:12    Procedures Procedures (including critical care time)  Medications  Ordered in ED Medications - No data to display   Initial Impression / Assessment and Plan / ED Course  I have reviewed the triage vital signs and the nursing notes.  Pertinent labs & imaging results that were available during my care of the patient were reviewed by me and considered in my medical decision making (see chart for details).  26 year old with 2nd toe contusion. Xray is negative. Advised conservative management with Tylenol/Ibupfrofen, rest.  Final Clinical Impressions(s) / ED Diagnoses   Final diagnoses:  Contusion of lesser toe of left foot without damage to nail, initial encounter    New Prescriptions New Prescriptions   No medications on file     Recardo Evangelist, PA-C 07/18/17 0143    Julianne Rice, MD 07/20/17 [phone]

## 2017-07-18 NOTE — ED Notes (Signed)
Bed: WTR9 Expected date:  Expected time:  Means of arrival:  Comments: 

## 2017-08-12 ENCOUNTER — Ambulatory Visit: Payer: Medicaid Other | Admitting: Certified Nurse Midwife

## 2017-09-01 ENCOUNTER — Encounter: Payer: Self-pay | Admitting: Certified Nurse Midwife

## 2017-09-01 ENCOUNTER — Other Ambulatory Visit: Payer: Self-pay

## 2017-09-01 ENCOUNTER — Ambulatory Visit (INDEPENDENT_AMBULATORY_CARE_PROVIDER_SITE_OTHER): Payer: Medicaid Other | Admitting: Certified Nurse Midwife

## 2017-09-01 ENCOUNTER — Other Ambulatory Visit (HOSPITAL_COMMUNITY)
Admission: RE | Admit: 2017-09-01 | Discharge: 2017-09-01 | Disposition: A | Discharge status: Home / Self Care | Payer: Medicaid Other | Source: Ambulatory Visit | Attending: Certified Nurse Midwife | Admitting: Certified Nurse Midwife

## 2017-09-01 VITALS — BP 114/75 | HR 75 | Ht 69.0 in | Wt 213.0 lb

## 2017-09-01 DIAGNOSIS — N898 Other specified noninflammatory disorders of vagina: Secondary | ICD-10-CM

## 2017-09-01 DIAGNOSIS — Z309 Encounter for contraceptive management, unspecified: Secondary | ICD-10-CM | POA: Diagnosis not present

## 2017-09-01 DIAGNOSIS — Z113 Encounter for screening for infections with a predominantly sexual mode of transmission: Secondary | ICD-10-CM

## 2017-09-01 DIAGNOSIS — Z3042 Encounter for surveillance of injectable contraceptive: Secondary | ICD-10-CM

## 2017-09-01 DIAGNOSIS — Z01419 Encounter for gynecological examination (general) (routine) without abnormal findings: Secondary | ICD-10-CM

## 2017-09-01 DIAGNOSIS — Z01411 Encounter for gynecological examination (general) (routine) with abnormal findings: Secondary | ICD-10-CM | POA: Diagnosis not present

## 2017-09-01 MED ORDER — MEDROXYPROGESTERONE ACETATE 150 MG/ML IM SUSP: 150.0000 mg | INTRAMUSCULAR | 4 refills | Status: DC

## 2017-09-01 NOTE — Progress Notes (Signed)
Subjective:        [name] is a 26 y.o. female here for a routine exam.  The patient has no complaints today. The patient issexually active, homosexual relationship; uses depo injections for AUB. Pertinent past medical history: none. Has been on Depo injections consistently. Is due for her next injection today. Due for annual exam. Hx of abnormal pap smears. Has regular bra fittings. Desires full STD testing today, desires swab.    Personal health questionnaire:  Is patient Ashkenazi Jewish, have a family history of breast and/or ovarian cancer: no Is there a family history of uterine cancer diagnosed at age < 41, gastrointestinal cancer, urinary tract cancer, family member who is a Field seismologist syndrome-associated carrier: no Is the patient overweight and hypertensive, family history of diabetes, personal history of gestational diabetes, preeclampsia or PCOS: no Is patient over 89, have PCOS,  family history of premature CHD under age 28, diabetes, smoke, have hypertension or peripheral artery disease:  yes At any time, has a partner hit, kicked or otherwise hurt or frightened you?: no Over the past 2 weeks, have you felt down, depressed or hopeless?: no Over the past 2 weeks, have you felt little interest or pleasure in doing things?:no   Gynecologic History No LMP recorded. Patient has had an injection. Contraception: Depo-Provera injections Last Pap: 08/07/16. Results were: normal Last mammogram: n/a <40 years.  Obstetric History OB History  Gravida Para Term Preterm AB Living  2 2 2  0 0 1  SAB TAB Ectopic Multiple Live Births  0 0 0 0 1    # Outcome Date GA Lbr Len/2nd Weight Sex Delivery Anes PTL Lv  2 Term 07/31/11 [redacted]w[redacted]d 04:00 / 00:12 7 lb 8.1 oz (3.405 kg) F Vag-Spont EPI  LIV     Birth Comments: none  1 Term               Past Medical History:  Diagnosis Date  . 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 Seizures ([place])   . Seizures ([place])    "converted  seizures", treated at [facility]    Past Surgical History:  Procedure Laterality Date  . none    . WISDOM TOOTH EXTRACTION       Current Outpatient Medications:  .  ibuprofen (ADVIL,MOTRIN) 800 MG tablet, Take 1 tablet (800 mg total) by mouth 3 (three) times daily. (Patient not taking: Reported on 07/01/2017), Disp: 30 tablet, Rfl: 0 .  medroxyPROGESTERone (DEPO-PROVERA) 150 MG/ML injection, Inject 1 mL (150 mg total) every 3 (three) months into the muscle., Disp: 1 mL, Rfl: 4  Current Facility-Administered Medications:  .  medroxyPROGESTERone (DEPO-PROVERA) injection 150 mg, 150 mg, Intramuscular, Q90 days, Mariette Cowley A, CNM, 150 mg at 04/15/17 1116 No Known Allergies  Social History   Tobacco Use  . Smoking status: Never Smoker  . Smokeless tobacco: Never Used  Substance Use Topics  . Alcohol use: No    Alcohol/week: 2.4 oz    Types: 4 Shots of liquor per week    Family History  Problem Relation Age of Onset  . Hypertension Other   . CAD Other       Review of Systems  Constitutional: negative for fatigue and weight loss Respiratory: negative for cough and wheezing Cardiovascular: negative for chest pain, fatigue and palpitations Gastrointestinal: negative for abdominal pain and change in bowel habits Musculoskeletal:negative for myalgias Neurological: negative for gait problems and tremors Behavioral/Psych: negative for abusive relationship, depression Endocrine: negative for temperature intolerance  Genitourinary:negative for abnormal menstrual periods, genital lesions, hot flashes, sexual problems and vaginal discharge Integument/breast: negative for breast lump, breast tenderness, nipple discharge and skin lesion(s)    Objective:       BP 114/75   Pulse 75   Ht 5\' 9"  (1.753 m)   Wt 213 lb (96.6 kg)   LMP  (LMP Unknown)   BMI 31.45 kg/m  General:   alert  Skin:   no rash or abnormalities  Lungs:   clear to auscultation bilaterally  Heart:   regular  rate and rhythm, S1, S2 normal, no murmur, click, rub or gallop  Breasts:   normal without suspicious masses, skin or nipple changes or axillary nodes  Abdomen:  normal findings: no organomegaly, soft, non-tender and no hernia  Pelvis:  External genitalia: normal general appearance Urinary system: urethral meatus normal and bladder without fullness, nontender Vaginal: normal without tenderness, induration or masses Cervix: normal appearance Adnexa: normal bimanual exam Uterus: anteverted and non-tender, normal size   Lab Review Urine pregnancy test Labs reviewed yes Radiologic studies reviewed no  50% of 30 min visit spent on counseling and coordination of care.    Assessment & Plan    Healthy female exam.      Education reviewed: calcium supplements, depression evaluation, low fat, low cholesterol diet, safe sex/STD prevention, self breast exams, skin cancer screening and weight bearing exercise. Contraception: Depo-Provera injections. Follow up in: 1 year.   Meds ordered this encounter  Medications  . medroxyPROGESTERone (DEPO-PROVERA) 150 MG/ML injection    Sig: Inject 1 mL (150 mg total) every 3 (three) months into the muscle.    Dispense:  1 mL    Refill:  4   Orders Placed This Encounter  Procedures  . HIV antibody (with reflex)  . RPR    Follow up as needed.

## 2017-09-01 NOTE — Progress Notes (Signed)
AEX, wants PAP/STD.

## 2017-09-02 LAB — RPR: RPR: NONREACTIVE

## 2017-09-02 LAB — CERVICOVAGINAL ANCILLARY ONLY
BACTERIAL VAGINITIS: NEGATIVE
CANDIDA VAGINITIS: NEGATIVE
CHLAMYDIA, DNA PROBE: NEGATIVE
Neisseria Gonorrhea: NEGATIVE
Trichomonas: NEGATIVE

## 2017-09-02 LAB — HIV ANTIBODY (ROUTINE TESTING W REFLEX): HIV Screen 4th Generation wRfx: NONREACTIVE

## 2017-09-08 ENCOUNTER — Other Ambulatory Visit: Payer: Self-pay | Admitting: Certified Nurse Midwife

## 2017-09-08 LAB — CYTOLOGY - PAP: Diagnosis: NEGATIVE

## 2017-09-22 ENCOUNTER — Ambulatory Visit (INDEPENDENT_AMBULATORY_CARE_PROVIDER_SITE_OTHER): Payer: Medicaid Other | Admitting: Pediatrics

## 2017-09-22 DIAGNOSIS — Z309 Encounter for contraceptive management, unspecified: Secondary | ICD-10-CM

## 2017-09-22 DIAGNOSIS — Z3042 Encounter for surveillance of injectable contraceptive: Secondary | ICD-10-CM

## 2017-09-22 MED ORDER — MEDROXYPROGESTERONE ACETATE 150 MG/ML IM SUSP
150.0000 mg | Freq: Once | INTRAMUSCULAR | Status: AC
Start: 2017-09-22 — End: 2017-09-22
  Administered 2017-09-22: 150 mg via INTRAMUSCULAR

## 2017-11-05 ENCOUNTER — Encounter (HOSPITAL_COMMUNITY): Payer: Self-pay | Admitting: Emergency Medicine

## 2017-11-05 ENCOUNTER — Other Ambulatory Visit: Payer: Self-pay

## 2017-11-05 ENCOUNTER — Emergency Department (HOSPITAL_COMMUNITY)
Admission: EM | Admit: 2017-11-05 | Discharge: 2017-11-05 | Disposition: A | Discharge status: Home / Self Care | Payer: Medicaid Other | Attending: Emergency Medicine | Admitting: Emergency Medicine

## 2017-11-05 DIAGNOSIS — M25541 Pain in joints of right hand: Secondary | ICD-10-CM | POA: Insufficient documentation

## 2017-11-05 DIAGNOSIS — M25542 Pain in joints of left hand: Secondary | ICD-10-CM | POA: Insufficient documentation

## 2017-11-05 DIAGNOSIS — M79642 Pain in left hand: Secondary | ICD-10-CM

## 2017-11-05 DIAGNOSIS — M79641 Pain in right hand: Secondary | ICD-10-CM

## 2017-11-05 MED ORDER — PREDNISONE 20 MG PO TABS: 40.0000 mg | ORAL_TABLET | Freq: Every day | ORAL | 0 refills | Status: AC

## 2017-11-05 MED ORDER — NAPROXEN 500 MG PO TABS: 500.0000 mg | ORAL_TABLET | Freq: Two times a day (BID) | ORAL | 0 refills | Status: DC

## 2017-11-05 NOTE — ED Triage Notes (Signed)
Patient here for inflammation in her joints, especially in her hands.  She has a job where the swelling increases due to repetitive movements.

## 2017-11-05 NOTE — ED Provider Notes (Signed)
[facility] EMERGENCY DEPARTMENT Provider Note   CSN: [id number] Arrival date & time: 11/05/17  3500     History   Chief Complaint Chief Complaint  Patient presents with  . Joint Pain    HPI [name] is a 27 y.o. female.  HPI   [name] is a 27yo female with a history of seizures who presents to the emergency department for evaluation of bilateral hand pain.  Patient states that she has been seen several times at the urgent care for "inflammation and pain in my joints" in the past.  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 Over the last week she has had increasing pain in both hands. Right worse than left. States that the pain is located in all of the joints in her hand, no specific point tenderness.  Has tried using icy hot over the hands and also taken ibuprofen without significant relief. She also wears a brace at times. She reports her pain is 7/10 in severity in the right hand and 3/10 in severity in the left hand. Describes pain as dull and aching. Pain is worsened when she tries to make a fist.  Denies fevers, chills, color change, joint swelling, open wound, numbness, weakness.  Denies recent injury to the hand.   Past Medical History:  Diagnosis Date  . 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 Seizures ([place])   . Seizures ([place])    "converted seizures", treated at [facility]    Patient Active Problem List   Diagnosis Date Noted  . Normal delivery 08/02/2011  . Seizures ([place]) 06/15/2011    Past Surgical History:  Procedure Laterality Date  . MASS EXCISION N/A 09/13/2013   Procedure: EXCISION OF SOFT TISSUE MASS ON BACK;  Surgeon: Irene Limbo, MD;  Location: [facility];  Service: Plastics;  Laterality: N/A;  . none    . WISDOM TOOTH EXTRACTION      OB History    Gravida Para Term Preterm AB Living   2 2 2  0 0 1   SAB TAB Ectopic Multiple Live Births   0 0 0 0 1        Home Medications    Prior to Admission medications   Medication Sig Start Date End Date Taking? Authorizing Provider  ibuprofen (ADVIL,MOTRIN) 800 MG tablet Take 1 tablet (800 mg total) by mouth 3 (three) times daily. Patient not taking: Reported on 07/01/2017 06/14/17   Lysbeth Penner, FNP  medroxyPROGESTERone (DEPO-PROVERA) 150 MG/ML injection Inject 1 mL (150 mg total) every 3 (three) months into the muscle. 09/01/17   Morene Crocker, CNM    Family History Family History  Problem Relation Age of Onset  . Hypertension Other   . CAD Other     Social History Social History   Tobacco Use  . Smoking status: Never Smoker  . Smokeless tobacco: Never Used  Substance Use Topics  . Alcohol use: No    Alcohol/week: 2.4 oz    Types: 4 Shots of liquor per week  . Drug use: No     Allergies   Patient has no known allergies.   Review of Systems Review of Systems  Musculoskeletal: Positive for arthralgias (bilateral hand pain). Negative for joint swelling.  Skin: Negative for color change and wound.  Neurological: Negative for weakness and numbness.     Physical Exam Updated Vital Signs BP 129/87 (BP Location: Right Arm)  Pulse 67   Temp 98.1 F (36.7 C) (Oral)   Resp 18   Ht 5\' 9"  (1.753 m)   Wt 88 kg (194 lb)   SpO2 99%   BMI 28.65 kg/m   Physical Exam  Constitutional: She appears well-developed and well-nourished. No distress.  HENT:  Head: Normocephalic and atraumatic.  Eyes: Right eye exhibits no discharge. Left eye exhibits no discharge.  Pulmonary/Chest: Effort normal. No respiratory distress.  Musculoskeletal:  Bilateral hands without swelling, erythema, break in skin or ecchymosis. Full ROM of bilateral wrists and fingers.. Grip strength 5/5. Radial pulses 2+ bilaterally. Capillary refill <2sec. Distal sensation to light touch intact in bilateral distribution of median, ulnar and radial nerves.  Neurological: She is alert. Coordination normal.   Skin: Skin is warm and dry. Capillary refill takes less than 2 seconds. She is not diaphoretic.  Psychiatric: She has a normal mood and affect. Her behavior is normal.  Nursing note and vitals reviewed.    ED Treatments / Results  Labs (all labs ordered are listed, but only abnormal results are displayed) Labs Reviewed - No data to display  EKG  EKG Interpretation None       Radiology No results found.  Procedures Procedures (including critical care time)  Medications Ordered in ED Medications - No data to display   Initial Impression / Assessment and Plan / ED Course  I have reviewed the triage vital signs and the nursing notes.  Pertinent labs & imaging results that were available during my care of the patient were reviewed by me and considered in my medical decision making (see chart for details).    Patient presents with bilateral hand pain.  Hands neurovascularly intact on exam.  No warmth, erythema, break in skin, swelling or signs of infection.  Vital signs stable.  She is in no acute distress.  No recent injury or point tenderness. Do not suspect acute injury requiring xray. Discussed this with patient who agrees. Have counseled her on use of NSAIDs for pain.  She may continue to use the brace for support.  Have also discussed the importance of making an appointment with primary care doctor for further evaluation of joint pain.  Patient agrees and voiced understanding to the above plan and has no complaint prior to discharge.  Final Clinical Impressions(s) / ED Diagnoses   Final diagnoses:  Pain in both hands    ED Discharge Orders    None       Bernarda Caffey 11/07/17 1014    Gareth Morgan, MD 11/08/17 [phone]

## 2017-11-05 NOTE — Discharge Instructions (Signed)
I have listed the information to [facility] wellness below.  They are a clinic across the street from [facility].  Please call to establish care and for follow-up of your ongoing joint pain.  Please take 500 mg Naprosyn twice a day for your pain.  This is an anti-inflammatory medicine.  I have also written you a prescription for prednisone to help with inflammation.  Please take 40 mg daily for the next 5 days.  Return to the emergency department if you have any new or concerning symptoms.

## 2017-12-10 ENCOUNTER — Other Ambulatory Visit: Payer: Self-pay | Admitting: Certified Nurse Midwife

## 2017-12-14 ENCOUNTER — Ambulatory Visit (INDEPENDENT_AMBULATORY_CARE_PROVIDER_SITE_OTHER): Payer: Medicaid Other

## 2017-12-14 VITALS — Wt 208.6 lb

## 2017-12-14 DIAGNOSIS — Z3042 Encounter for surveillance of injectable contraceptive: Secondary | ICD-10-CM

## 2017-12-14 MED ORDER — MEDROXYPROGESTERONE ACETATE 150 MG/ML IM SUSP: 150.0000 mg | INTRAMUSCULAR | Status: DC

## 2017-12-14 NOTE — Progress Notes (Signed)
I have reviewed the chart and agree with nursing staff's documentation of this patient's encounter.  Mora Bellman, MD 12/14/2017 5:13 PM

## 2017-12-14 NOTE — Progress Notes (Signed)
Patient is in the office for depo injection, administered and pt tolerated well .Marland Kitchen Administrations This Visit    medroxyPROGESTERone (DEPO-PROVERA) injection 150 mg    Admin Date 12/14/2017 Action Given Dose 150 mg Route Intramuscular Administered By Hinton Lovely, RN

## 2018-01-12 ENCOUNTER — Emergency Department (HOSPITAL_COMMUNITY)
Admission: EM | Admit: 2018-01-12 | Discharge: 2018-01-12 | Disposition: A | Discharge status: Home / Self Care | Payer: Medicaid Other | Attending: Emergency Medicine | Admitting: Emergency Medicine

## 2018-01-12 ENCOUNTER — Other Ambulatory Visit: Payer: Self-pay

## 2018-01-12 ENCOUNTER — Encounter (HOSPITAL_COMMUNITY): Payer: Self-pay | Admitting: *Deleted

## 2018-01-12 DIAGNOSIS — Z79899 Other long term (current) drug therapy: Secondary | ICD-10-CM | POA: Insufficient documentation

## 2018-01-12 DIAGNOSIS — H1032 Unspecified acute conjunctivitis, left eye: Secondary | ICD-10-CM | POA: Insufficient documentation

## 2018-01-12 MED ORDER — POLYMYXIN B-TRIMETHOPRIM 10000-0.1 UNIT/ML-% OP SOLN: 1.0000 [drp] | OPHTHALMIC | 0 refills | Status: AC

## 2018-01-12 NOTE — ED Provider Notes (Signed)
[facility] EMERGENCY DEPARTMENT Provider Note   CSN: [id number] Arrival date & time: 01/12/18  0856     History   Chief Complaint Chief Complaint  Patient presents with  . Eye Pain    HPI [name] is a 27 y.o. female.  HPI   Patient is a 27 year old female who presents the ED today complaining of left eye drainage that began when she woke up this morning.  States she woke up and her eye was crusted shut.  Had purulent drainage to the eye.  Since then has had continuous watering of the left eye.  Also notes some itchiness to the left eye, but denies any pain.  No blurred vision or headaches.  No sensitivity to light.  No nausea, vomiting.  States she works at a daycare.  Denies any foreign body sensation in her eye.  No fevers.  She has not tried anything for her symptoms.  Past Medical History:  Diagnosis Date  . 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 Seizures ([place])   . Seizures ([place])    "converted seizures", treated at [facility]    Patient Active Problem List   Diagnosis Date Noted  . Normal delivery 08/02/2011  . Seizures ([place]) 06/15/2011    Past Surgical History:  Procedure Laterality Date  . MASS EXCISION N/A 09/13/2013   Procedure: EXCISION OF SOFT TISSUE MASS ON BACK;  Surgeon: Irene Limbo, MD;  Location: [facility];  Service: Plastics;  Laterality: N/A;  . none    . WISDOM TOOTH EXTRACTION       OB History    Gravida  2   Para  2   Term  2   Preterm  0   AB  0   Living  1     SAB  0   TAB  0   Ectopic  0   Multiple  0   Live Births  1            Home Medications    Prior to Admission medications   Medication Sig Start Date End Date Taking? Authorizing Provider  ibuprofen (ADVIL,MOTRIN) 800 MG tablet Take 1 tablet (800 mg total) by mouth 3 (three) times daily. Patient not taking: Reported on 07/01/2017 06/14/17   Lysbeth Penner, FNP  ibuprofen (ADVIL,MOTRIN) 800 MG tablet TAKE 1 TABLET  BY MOUTH EVERY 8 HOURS AS NEEDED 12/11/17   Shelly Bombard, MD  medroxyPROGESTERone (DEPO-PROVERA) 150 MG/ML injection Inject 1 mL (150 mg total) every 3 (three) months into the muscle. 09/01/17   Kandis Cocking A, CNM  naproxen (NAPROSYN) 500 MG tablet Take 1 tablet (500 mg total) by mouth 2 (two) times daily. 11/05/17   Glyn Ade, PA-C  trimethoprim-polymyxin b (POLYTRIM) ophthalmic solution Place 1 drop into the left eye every 3 (three) hours for 7 days. Do not take more than 6 doses per day 01/12/18 01/19/18  Felecia Stanfill S, PA-C    Family History Family History  Problem Relation Age of Onset  . Hypertension Other   . CAD Other     Social History Social History   Tobacco Use  . Smoking status: Never Smoker  . Smokeless tobacco: Never Used  Substance Use Topics  . Alcohol use: No    Alcohol/week: 2.4 oz    Types: 4 Shots of liquor per week  . Drug use: No     Allergies   Patient has no known allergies.   Review of Systems  Review of Systems  Constitutional: Negative for fever.  Eyes: Positive for discharge, redness and itching. Negative for photophobia, pain and visual disturbance.  Gastrointestinal: Negative for nausea and vomiting.  Skin: Negative for rash.  Neurological: Negative for headaches.     Physical Exam Updated Vital Signs BP (!) 130/91 (BP Location: Right Arm)   Pulse 75   Temp 98.9 F (37.2 C) (Oral)   Resp 18   Ht 5\' 9"  (1.753 m)   Wt 88 kg (194 lb)   SpO2 100%   BMI 28.65 kg/m   Physical Exam  Constitutional: She appears well-developed and well-nourished. No distress.  HENT:  Head: Normocephalic and atraumatic.  Nose: Nose normal.  Mouth/Throat: Oropharynx is clear and moist.  Eyes: Pupils are equal, round, and reactive to light. EOM are normal.  Small amount of erythema to conjunctiva of left eye. No pain with EOM. No swelling around eye. No purulent drainage noted on exam. Visual acuity 20/20 to bilat and R/L eye with  corrected lenses.  Neck: Normal range of motion. Neck supple.  No nuchal ridgidity  Cardiovascular: Normal rate.  Pulmonary/Chest: Effort normal.  Musculoskeletal: Normal range of motion.  Neurological: She is alert. No cranial nerve deficit.  Skin: Skin is warm and dry.  Psychiatric: She has a normal mood and affect.  Nursing note and vitals reviewed.    ED Treatments / Results  Labs (all labs ordered are listed, but only abnormal results are displayed) Labs Reviewed - No data to display  EKG None  Radiology No results found.  Procedures Procedures (including critical care time)  Medications Ordered in ED Medications - No data to display   Initial Impression / Assessment and Plan / ED Course  I have reviewed the triage vital signs and the nursing notes.  Pertinent labs & imaging results that were available during my care of the patient were reviewed by me and considered in my medical decision making (see chart for details).      Final Clinical Impressions(s) / ED Diagnoses   Final diagnoses:  Acute bacterial conjunctivitis of left eye    Pt dx likely bacterial conjunctivitis based on presentation & eye exam. Doubt HSV or VSV infection. Pt is not a contact lens wearer.  Exam non-concerning for orbital cellulitis, hyphema, corneal ulcers, corneal abrasions or trauma. Gave rx for polymixin.  Patient has been instructed to use cool compresses and practice personal hygiene with frequent hand washing.  Patient understands to follow up with pcp, especially if new symptoms including change in vision, purulent drainage, or entrapment occur.  Return precautions discussed. All questions answered.  ED Discharge Orders        Ordered    trimethoprim-polymyxin b (POLYTRIM) ophthalmic solution  Every  3 hours     01/12/18 [address] Tiernan Suto S, PA-C 01/12/18 1210    Pattricia Boss, MD 01/12/18 [phone]

## 2018-01-12 NOTE — ED Triage Notes (Signed)
To ED for eval of left eye drainage and itching. Works in [employer].

## 2018-01-12 NOTE — Discharge Instructions (Addendum)
You were given a prescription for antibiotic drops for your eye.  Use 1 drop in the left eye every 3 hours.  Do not take more than 6 doses per day.  If you have any adverse reaction to this medicine you should stop taking it.  Please follow-up with your primary care doctor within 1 week.  If you not have a primary care doctor you are given information to follow-up with one on your discharge paperwork.  Return to the emergency department for any new or worsening symptoms.

## 2018-02-17 ENCOUNTER — Encounter (HOSPITAL_COMMUNITY): Payer: Self-pay | Admitting: *Deleted

## 2018-02-17 ENCOUNTER — Emergency Department (HOSPITAL_COMMUNITY): Payer: Self-pay

## 2018-02-17 ENCOUNTER — Emergency Department (HOSPITAL_COMMUNITY)
Admission: EM | Admit: 2018-02-17 | Discharge: 2018-02-17 | Disposition: A | Discharge status: Home / Self Care | Payer: Self-pay | Attending: Emergency Medicine | Admitting: Emergency Medicine

## 2018-02-17 DIAGNOSIS — Y990 Civilian activity done for income or pay: Secondary | ICD-10-CM | POA: Insufficient documentation

## 2018-02-17 DIAGNOSIS — Y9389 Activity, other specified: Secondary | ICD-10-CM | POA: Insufficient documentation

## 2018-02-17 DIAGNOSIS — X503XXA Overexertion from repetitive movements, initial encounter: Secondary | ICD-10-CM | POA: Insufficient documentation

## 2018-02-17 DIAGNOSIS — S46912A Strain of unspecified muscle, fascia and tendon at shoulder and upper arm level, left arm, initial encounter: Secondary | ICD-10-CM | POA: Insufficient documentation

## 2018-02-17 DIAGNOSIS — Y9289 Other specified places as the place of occurrence of the external cause: Secondary | ICD-10-CM | POA: Insufficient documentation

## 2018-02-17 MED ORDER — IBUPROFEN 200 MG PO TABS
600.0000 mg | ORAL_TABLET | Freq: Once | ORAL | Status: AC
  Administered 2018-02-17: 600 mg via ORAL
  Filled 2018-02-17: qty 3

## 2018-02-17 MED ORDER — DICLOFENAC SODIUM 50 MG PO TBEC: 50.0000 mg | DELAYED_RELEASE_TABLET | Freq: Two times a day (BID) | ORAL | 0 refills | Status: DC

## 2018-02-17 NOTE — ED Provider Notes (Signed)
[facility] DEPT Provider Note   CSN: [id number] Arrival date & time: 02/17/18  1305     History   Chief Complaint Chief Complaint  Patient presents with  . Shoulder Pain    HPI HAELEE Salas is a 27 y.o. female who presents to the ED with left shoulder pain that started 3 days ago. 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 Last night the pain got worse so patient came in today for evaluation. Patient just started the job a week ago. The patient has taken nothing for pain.  HPI  Past Medical History:  Diagnosis Date  . 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 Seizures ([place])   . Seizures ([place])    "converted seizures", treated at [facility]    Patient Active Problem List   Diagnosis Date Noted  . Normal delivery 08/02/2011  . Seizures ([place]) 06/15/2011    Past Surgical History:  Procedure Laterality Date  . MASS EXCISION N/A 09/13/2013   Procedure: EXCISION OF SOFT TISSUE MASS ON BACK;  Surgeon: Irene Limbo, MD;  Location: [facility];  Service: Plastics;  Laterality: N/A;  . none    . WISDOM TOOTH EXTRACTION       OB History    Gravida  2   Para  2   Term  2   Preterm  0   AB  0   Living  1     SAB  0   TAB  0   Ectopic  0   Multiple  0   Live Births  1            Home Medications    Prior to Admission medications   Medication Sig Start Date End Date Taking? Authorizing Provider  diclofenac (VOLTAREN) 50 MG EC tablet Take 1 tablet (50 mg total) by mouth 2 (two) times daily. 02/17/18   Ashley Murrain, NP  medroxyPROGESTERone (DEPO-PROVERA) 150 MG/ML injection Inject 1 mL (150 mg total) every 3 (three) months into the muscle. 09/01/17   Morene Crocker, CNM    Family History Family History  Problem Relation Age of Onset  . Hypertension Other   . CAD Other     Social History Social History   Tobacco Use  . Smoking status: Never Smoker  .  Smokeless tobacco: Never Used  Substance Use Topics  . Alcohol use: No    Alcohol/week: 2.4 oz    Types: 4 Shots of liquor per week  . Drug use: No     Allergies   Patient has no known allergies.   Review of Systems Review of Systems  Musculoskeletal: Positive for arthralgias.       Left shoulder pain  All other systems reviewed and are negative.    Physical Exam Updated Vital Signs BP 122/88 (BP Location: Left Arm)   Pulse 87   Temp 98.8 F (37.1 C) (Oral)   Resp 18   LMP 02/08/2018 (Approximate)   SpO2 100%   Physical Exam  Constitutional: She appears well-developed and well-nourished. No distress.  HENT:  Head: Normocephalic.  Eyes: EOM are normal.  Neck: Neck supple.  Cardiovascular: Normal rate.  Pulmonary/Chest: Effort normal.  Musculoskeletal:       Left shoulder: She exhibits tenderness and spasm. She exhibits normal range of motion, no swelling, no crepitus, no deformity, no laceration, normal pulse and normal strength.  Radial pulses 2+, grips are equal  Neurological: She  is alert.  Skin: Skin is warm and dry.  Psychiatric: She has a normal mood and affect. Her behavior is normal.  Nursing note and vitals reviewed.    ED Treatments / Results  Labs (all labs ordered are listed, but only abnormal results are displayed) Labs Reviewed - No data to display Radiology Dg Shoulder Left  Result Date: 02/17/2018 CLINICAL DATA:  LEFT anterior shoulder pain for 3 days. History of seizures. EXAM: LEFT SHOULDER - 2+ VIEW COMPARISON:  None. FINDINGS: The humeral head is well-formed and located. The subacromial, glenohumeral and acromioclavicular joint spaces are intact. No destructive bony lesions. Soft tissue planes are non-suspicious. IMPRESSION: Negative. Electronically Signed   By: Elon Alas M.D.   On: 02/17/2018 14:37    Procedures Procedures (including critical care time)  Medications Ordered in ED Medications  ibuprofen (ADVIL,MOTRIN) tablet  600 mg (600 mg Oral Given 02/17/18 1432)     Initial Impression / Assessment and Plan / ED Course  I have reviewed the triage vital signs and the nursing notes. 27 y.o. female with left shoulder pain stable for d/c without focal neuro deficits. Will treat with NSAIDS. Patient to f/u with PCP or ortho if symptoms persist.  Final Clinical Impressions(s) / ED Diagnoses   Final diagnoses:  Strain of left shoulder, initial encounter    ED Discharge Orders        Ordered    diclofenac (VOLTAREN) 50 MG EC tablet  2 times daily     02/17/18 [place], [place], NP 02/17/18 2203    Jola Schmidt, MD 02/18/18 [phone]

## 2018-02-17 NOTE — ED Triage Notes (Signed)
Pt complains of left shoulder pain for the past 3 days. Pt states pain became worse last night. Pt states she does repetitive arm movement at work.

## 2018-03-08 ENCOUNTER — Ambulatory Visit (INDEPENDENT_AMBULATORY_CARE_PROVIDER_SITE_OTHER): Payer: Medicaid Other

## 2018-03-08 ENCOUNTER — Ambulatory Visit: Payer: Medicaid Other

## 2018-03-08 VITALS — BP 124/82 | HR 75 | Wt 208.6 lb

## 2018-03-08 DIAGNOSIS — Z3042 Encounter for surveillance of injectable contraceptive: Secondary | ICD-10-CM | POA: Diagnosis not present

## 2018-03-08 MED ORDER — MEDROXYPROGESTERONE ACETATE 150 MG/ML IM SUSP
150.0000 mg | Freq: Once | INTRAMUSCULAR | Status: AC
  Administered 2018-03-08: 150 mg via INTRAMUSCULAR

## 2018-03-08 NOTE — Progress Notes (Signed)
Presents for DEPO, given in right deltoid, tolerated well. Patient provided.  Next DEPO 8/5-19/2019.   Administrations This Visit    medroxyPROGESTERone (DEPO-PROVERA) injection 150 mg    Admin Date 03/08/2018 Action Given Dose 150 mg Route Intramuscular Administered By Tamela Oddi, RMA

## 2018-03-29 IMAGING — CT CT ANGIO CHEST
2 of 6 series · 18 of 36 positions shown · IV contrast (Omni 300)
Comparison: Chest radiograph 01/04/2017

CLINICAL DATA: Central chest pain and tightness

EXAM:
CT ANGIOGRAPHY CHEST WITH CONTRAST
TECHNIQUE: Multidetector CT imaging of the chest was performed using the
standard protocol during bolus administration of intravenous
contrast. Multiplanar CT image reconstructions and MIPs were
obtained to evaluate the vascular anatomy.
CONTRAST:  100 mL Isovue 370 IV

[Series 7: pe thins · axial · 0.67mm/px · z∈[+1022,+1262]mm · 17 of 271 slices shown]
[im 16/271  lung]
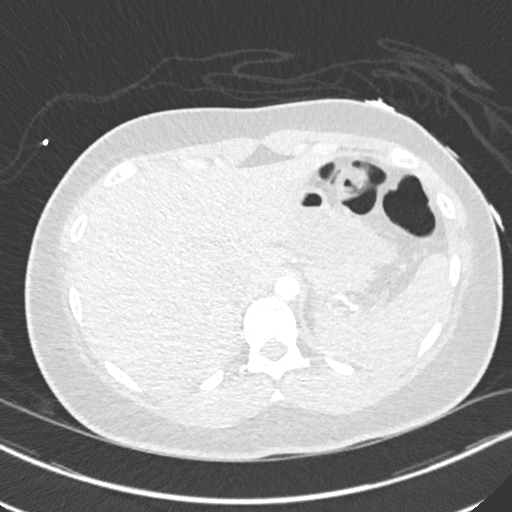
[im 31/271  mediastinal]
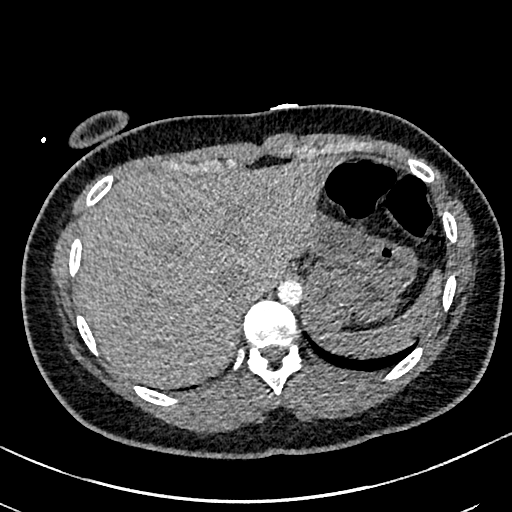
[im 46/271  lung]
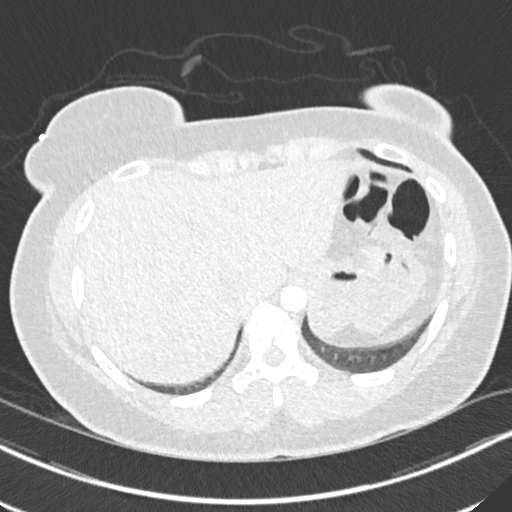
[im 61/271  mediastinal]
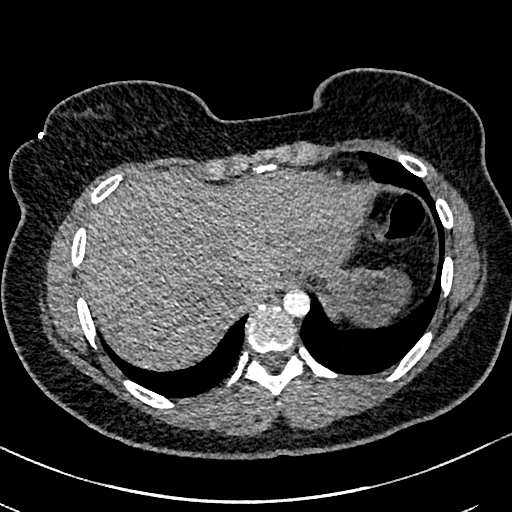
[im 76/271  lung]
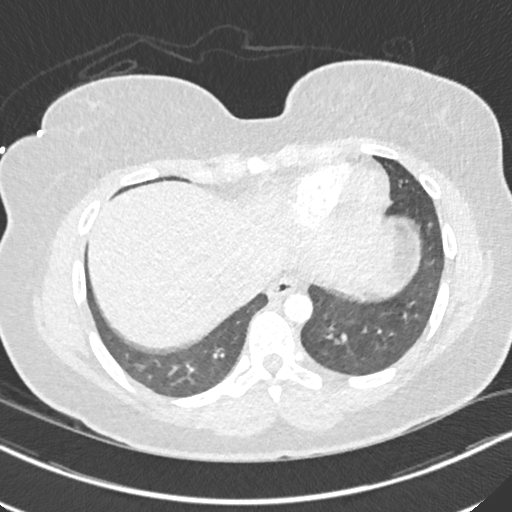
[im 91/271  mediastinal]
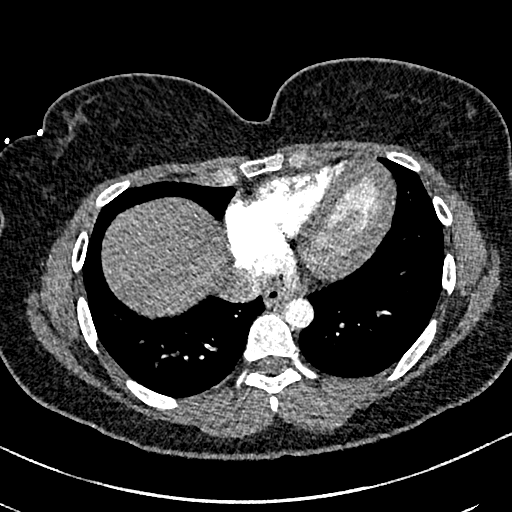
[im 106/271  lung]
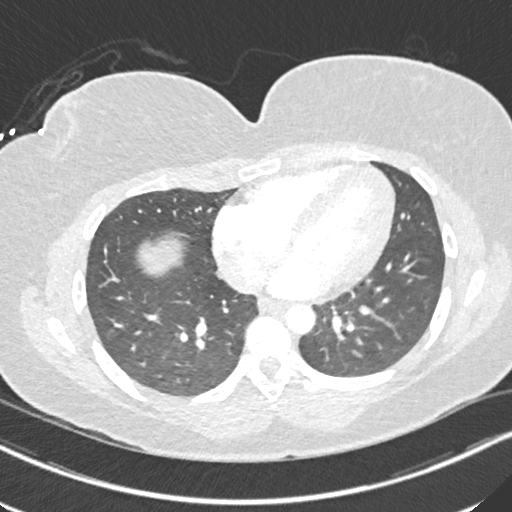
[im 121/271  mediastinal]
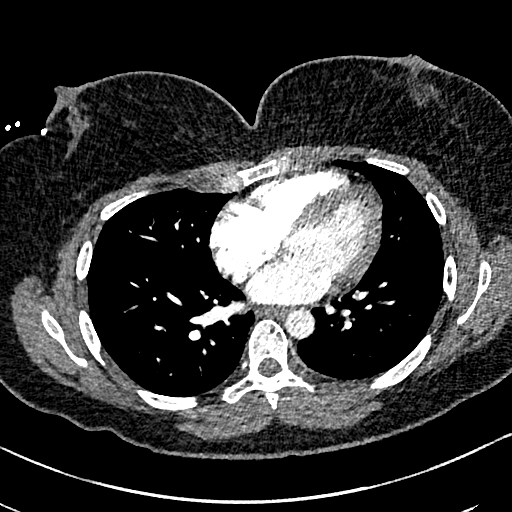
[im 136/271  lung]
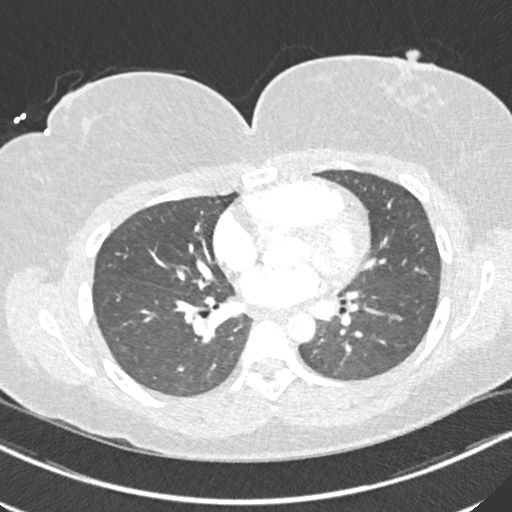
[im 151/271  mediastinal]
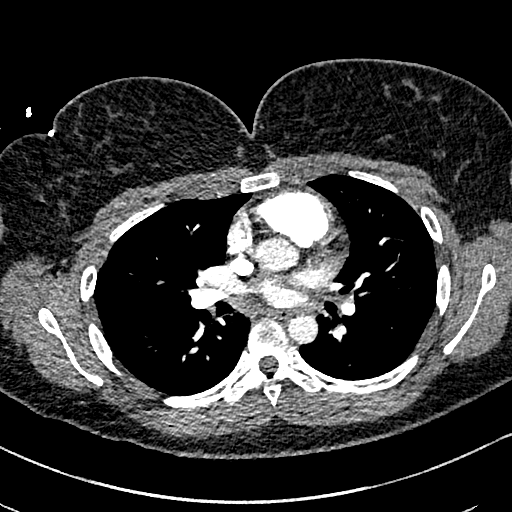
[im 166/271  lung]
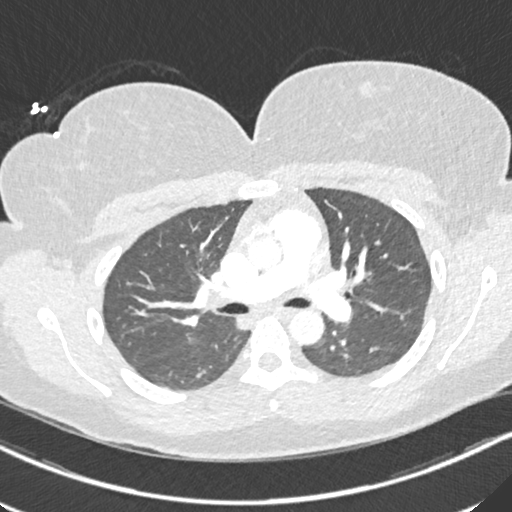
[im 181/271  mediastinal]
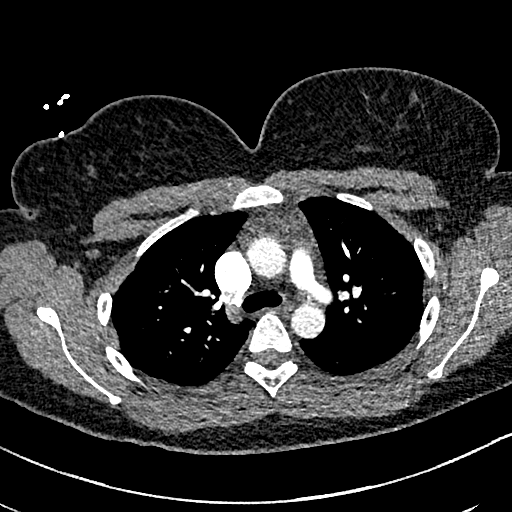
[im 196/271  lung]
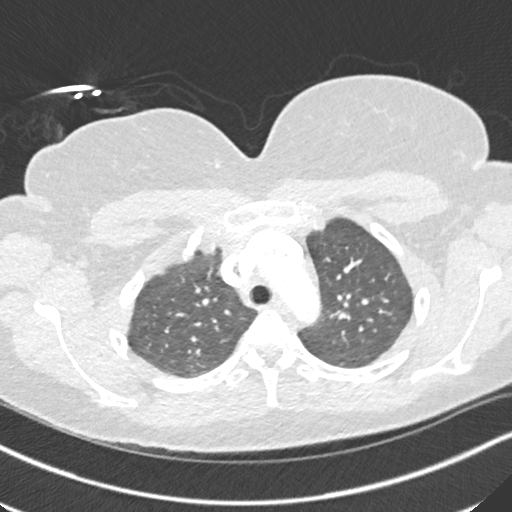
[im 211/271  mediastinal]
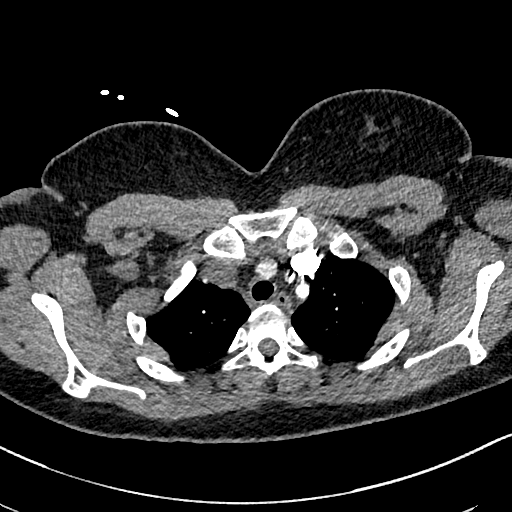
[im 226/271  lung]
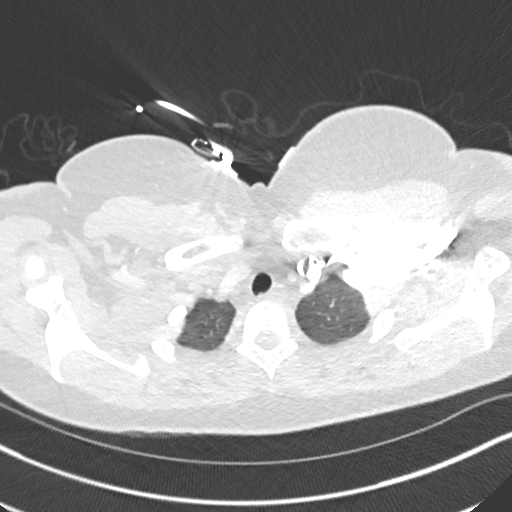
[im 241/271  mediastinal]
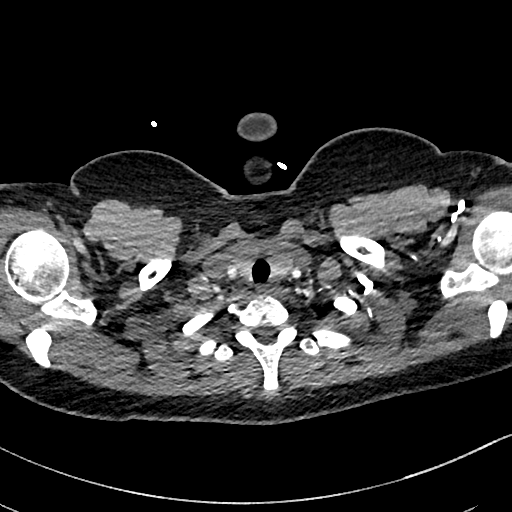
[im 256/271  lung]
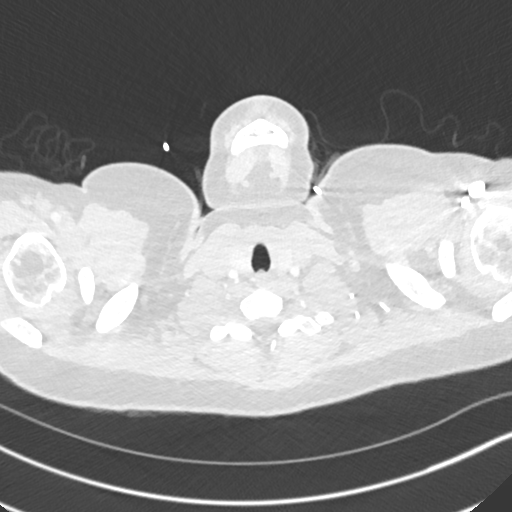

[Series 8: pe 2mm cor · coronal · 0.54mm/px · 1 of 106 slices shown]
[im 53/106  mediastinal]
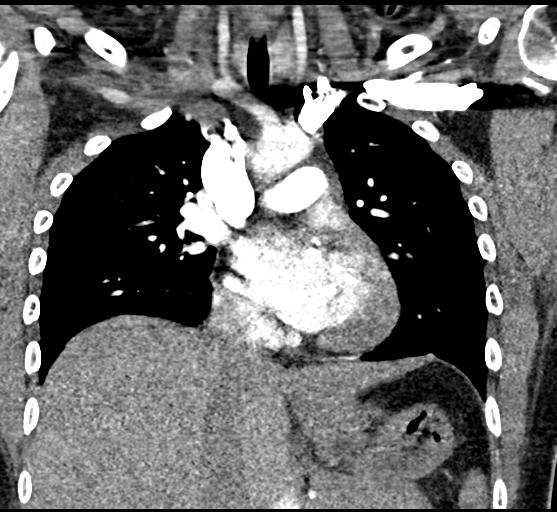

[18 of 36 positions shown; findings below may reference images not displayed]

FINDINGS: Cardiovascular: Contrast injection is sufficient to demonstrate
satisfactory opacification of the pulmonary arteries to the
segmental level. There is no pulmonary embolus. The main pulmonary
artery is within normal limits for size. There is no CT evidence of
acute right heart strain. The visualized aorta is normal. There is a
normal 3-vessel arch branching pattern. Heart size is normal,
without pericardial effusion.

Mediastinum/Nodes: No mediastinal, hilar or axillary
lymphadenopathy. The visualized thyroid and thoracic esophageal
course are unremarkable.

Lungs/Pleura: No pulmonary nodules or masses. No pleural effusion or
pneumothorax. No focal airspace consolidation. No focal pleural
abnormality.

Upper Abdomen: Contrast bolus timing is not optimized for evaluation
of the abdominal organs. Within this limitation, the visualized
organs of the upper abdomen are normal.

Musculoskeletal: No chest wall abnormality. No acute or significant
osseous findings.

Review of the MIP images confirms the above findings.
IMPRESSION: Normal CTA of the chest. No pulmonary embolus or acute aortic
syndrome.

## 2018-05-14 ENCOUNTER — Emergency Department (HOSPITAL_COMMUNITY): Payer: Self-pay

## 2018-05-14 ENCOUNTER — Emergency Department (HOSPITAL_COMMUNITY)
Admission: EM | Admit: 2018-05-14 | Discharge: 2018-05-14 | Disposition: A | Discharge status: Home / Self Care | Payer: Self-pay | Attending: Emergency Medicine | Admitting: Emergency Medicine

## 2018-05-14 ENCOUNTER — Encounter (HOSPITAL_COMMUNITY): Payer: Self-pay | Admitting: Emergency Medicine

## 2018-05-14 DIAGNOSIS — R0789 Other chest pain: Secondary | ICD-10-CM | POA: Insufficient documentation

## 2018-05-14 DIAGNOSIS — Z79899 Other long term (current) drug therapy: Secondary | ICD-10-CM | POA: Insufficient documentation

## 2018-05-14 LAB — I-STAT TROPONIN, ED: TROPONIN I, POC: 0 ng/mL (ref 0.00–0.08)

## 2018-05-14 LAB — CBC
HEMATOCRIT: 42.2 % (ref 36.0–46.0)
HEMOGLOBIN: 12.8 g/dL (ref 12.0–15.0)
MCH: 23.1 pg — AB (ref 26.0–34.0)
MCHC: 30.3 g/dL (ref 30.0–36.0)
MCV: 76 fL — ABNORMAL LOW (ref 78.0–100.0)
Platelets: 231 10*3/uL (ref 150–400)
RBC: 5.55 MIL/uL — ABNORMAL HIGH (ref 3.87–5.11)
RDW: 14.2 % (ref 11.5–15.5)
WBC: 6.2 10*3/uL (ref 4.0–10.5)

## 2018-05-14 LAB — BASIC METABOLIC PANEL
ANION GAP: 7 (ref 5–15)
BUN: 8 mg/dL (ref 6–20)
CALCIUM: 9.2 mg/dL (ref 8.9–10.3)
CO2: 26 mmol/L (ref 22–32)
Chloride: 107 mmol/L (ref 98–111)
Creatinine, Ser: 0.86 mg/dL (ref 0.44–1.00)
GFR calc Af Amer: >60 mL/min (ref >60)
GFR calc non Af Amer: >60 mL/min (ref >60)
Glucose, Bld: 104 mg/dL — ABNORMAL HIGH (ref 70–99)
Potassium: 3.5 mmol/L (ref 3.5–5.1)
Sodium: 140 mmol/L (ref 135–145)

## 2018-05-14 LAB — I-STAT BETA HCG BLOOD, ED (MC, WL, AP ONLY): I-stat hCG, quantitative: <5 m[IU]/mL (ref <5)

## 2018-05-14 MED ORDER — IBUPROFEN 400 MG PO TABS
600.0000 mg | ORAL_TABLET | Freq: Once | ORAL | Status: AC
  Administered 2018-05-14: 20:00:00 600 mg via ORAL
  Filled 2018-05-14: qty 1

## 2018-05-14 NOTE — ED Provider Notes (Signed)
[facility] EMERGENCY DEPARTMENT Provider Note   CSN: [id number] Arrival date & time: 05/14/18  1450  History   Chief Complaint Chief Complaint  Patient presents with  . Chest Pain   HPI  Patient is a generally healthy 27 year old female presenting to the ED for chest pain.  She states she was twisting her torso this morning when she had abrupt onset of left-sided chest pain which is sharp.  No pain at rest but is brought on by certain stretching positions.  No dyspnea, leg swelling, or history of DVT/PE.  No vomiting, syncope, or palpitations.  Pain is mild and intermittent.  No medications tried at home.  No other recent illness such as URI symptoms, cough, diarrhea, dysuria, or fever.  Past Medical History:  Diagnosis Date  . 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 Seizures ([place])   . Seizures ([place])    "converted seizures", treated at [facility]    Patient Active Problem List   Diagnosis Date Noted  . Normal delivery 08/02/2011  . Seizures ([place]) 06/15/2011    Past Surgical History:  Procedure Laterality Date  . MASS EXCISION N/A 09/13/2013   Procedure: EXCISION OF SOFT TISSUE MASS ON BACK;  Surgeon: Irene Limbo, MD;  Location: [place];  Service: Plastics;  Laterality: N/A;  . none    . WISDOM TOOTH EXTRACTION       OB History    Gravida  2   Para  2   Term  2   Preterm  0   AB  0   Living  1     SAB  0   TAB  0   Ectopic  0   Multiple  0   Live Births  1            Home Medications    Prior to Admission medications   Medication Sig Start Date End Date Taking? Authorizing Provider  diclofenac (VOLTAREN) 50 MG EC tablet Take 1 tablet (50 mg total) by mouth 2 (two) times daily. 02/17/18   Ashley Murrain, NP  medroxyPROGESTERone (DEPO-PROVERA) 150 MG/ML injection Inject 1 mL (150 mg total) every 3 (three) months into the muscle. 09/01/17   Morene Crocker, CNM    Family History Family History  Problem  Relation Age of Onset  . Hypertension Other   . CAD Other     Social History Social History   Tobacco Use  . Smoking status: Never Smoker  . Smokeless tobacco: Never Used  Substance Use Topics  . Alcohol use: No    Alcohol/week: 2.4 oz    Types: 4 Shots of liquor per week  . Drug use: No     Allergies   Patient has no known allergies.   Review of Systems Review of Systems  Constitutional: Negative for fever.  HENT: Negative for congestion and sore throat.   Eyes: Negative for visual disturbance.  Respiratory: Negative for cough and shortness of breath.   Cardiovascular: Positive for chest pain. Negative for palpitations and leg swelling.  Gastrointestinal: Negative for abdominal pain, diarrhea and vomiting.  Genitourinary: Negative for dysuria.  Musculoskeletal: Negative for neck pain.  Neurological: Negative for syncope.  All other systems reviewed and are negative.    Physical Exam Updated Vital Signs BP 126/87   Pulse 75   Temp 98.2 F (36.8 C) (Oral)   Resp 16   SpO2 100%   Physical Exam  Constitutional: She is oriented to person, place, and time.  No distress.  HENT:  Head: Normocephalic and atraumatic.  Mouth/Throat: Oropharynx is clear and moist.  Eyes: Pupils are equal, round, and reactive to light. Conjunctivae are normal.  Neck: Neck supple. No tracheal deviation present.  Cardiovascular: Normal rate, regular rhythm, normal heart sounds and intact distal pulses.  No murmur heard. Pulmonary/Chest: Effort normal and breath sounds normal. No stridor. No respiratory distress. She has no wheezes. She has no rales.  Pain reproduced with palpation of left anterior chest  Abdominal: Soft. She exhibits no distension and no mass. There is no tenderness. There is no guarding.  Musculoskeletal: She exhibits no edema or deformity.  Neurological: She is alert and oriented to person, place, and time.  Skin: Skin is warm and dry.  Psychiatric: She has a normal  mood and affect. Her behavior is normal.  Nursing note and vitals reviewed.    ED Treatments / Results  Labs (all labs ordered are listed, but only abnormal results are displayed) Labs Reviewed  BASIC METABOLIC PANEL - Abnormal; Notable for the following components:      Result Value   Glucose, Bld 104 (*)    All other components within normal limits  CBC - Abnormal; Notable for the following components:   RBC 5.55 (*)    MCV 76.0 (*)    MCH 23.1 (*)    All other components within normal limits  I-STAT TROPONIN, ED  I-STAT BETA HCG BLOOD, ED ([facility], [facility], [facility] ONLY)    EKG EKG Interpretation  Date/Time:  Friday May 14 2018 14:51:25 EDT Ventricular Rate:  104 PR Interval:  156 QRS Duration: 72 QT Interval:  318 QTC Calculation: 418 R Axis:   76 Text Interpretation:  Sinus tachycardia Otherwise normal ECG Confirmed by Lajean Saver [phone]) on 05/14/2018 7:09:04 PM   Radiology Dg Chest 2 View  Result Date: 05/14/2018 CLINICAL DATA:  Initial evaluation for acute chest pain. EXAM: CHEST - 2 VIEW COMPARISON:  Prior radiograph from 03/12/2017. FINDINGS: The cardiac and mediastinal silhouettes are stable in size and contour, and remain within normal limits. The lungs are normally inflated. No airspace consolidation, pleural effusion, or pulmonary edema is identified. There is no pneumothorax. No acute osseous abnormality identified. IMPRESSION: No active cardiopulmonary disease. Electronically Signed   By: Jeannine Boga M.D.   On: 05/14/2018 16:18    Procedures Procedures (including critical care time)  Medications Ordered in ED Medications  ibuprofen (ADVIL,MOTRIN) tablet 600 mg (600 mg Oral Given 05/14/18 1937)     Initial Impression / Assessment and Plan / ED Course  I have reviewed the triage vital signs and the nursing notes.  Pertinent labs & imaging results that were available during my care of the patient were reviewed by me and considered in my medical decision  making (see chart for details).  This is a healthy 27 year old female presenting to the ED for atypical chest pain as above.  Clinical picture most consistent with musculoskeletal pain.  EKG is normal without signs of WPW, Brug[place], HOCM, or ischemia.  Do not suspect ACS and troponin sent in.  Do not feel serial biomarkers warranted.  CXR is clear without signs of pneumothorax or pneumonia.  Her documented vitals show no tachycardia, although her EKG has rate of 104.  Otherwise, she is PERC negative (progesterone only contraceptive).  Do not suspect PE or dissection.  Will discharge home with course of NSAIDs.   Patient informed of all ED findings. Return precautions and follow up plan reviewed. All questions answered.  Final Clinical Impressions(s) / ED Diagnoses   Final diagnoses:  Atypical chest pain     Prescilla Sours, MD 05/14/18 2322    Lajean Saver, MD 05/14/18 [phone]

## 2018-05-14 NOTE — ED Triage Notes (Signed)
Patient complains of left sided chest pain that started this morning, exacerbated by twisting of her torso. Denies shortness of breath, no other complaints at this time.

## 2018-05-26 ENCOUNTER — Ambulatory Visit: Payer: Medicaid Other

## 2018-06-04 ENCOUNTER — Ambulatory Visit (INDEPENDENT_AMBULATORY_CARE_PROVIDER_SITE_OTHER): Payer: Medicaid Other

## 2018-06-04 VITALS — BP 118/80 | HR 76 | Wt 216.2 lb

## 2018-06-04 DIAGNOSIS — Z3042 Encounter for surveillance of injectable contraceptive: Secondary | ICD-10-CM | POA: Diagnosis not present

## 2018-06-04 MED ORDER — MEDROXYPROGESTERONE ACETATE 150 MG/ML IM SUSP
150.0000 mg | Freq: Once | INTRAMUSCULAR | Status: AC
  Administered 2018-06-04: 150 mg via INTRAMUSCULAR

## 2018-06-04 NOTE — Progress Notes (Signed)
Presents for Depo, given in right deltoid, tolerated well.  Next DEPO 11/1-15/2019.  Administrations This Visit    medroxyPROGESTERone (DEPO-PROVERA) injection 150 mg    Admin Date 06/04/2018 Action Given Dose 150 mg Route Intramuscular Administered By Tamela Oddi, RMA

## 2018-08-23 ENCOUNTER — Ambulatory Visit (INDEPENDENT_AMBULATORY_CARE_PROVIDER_SITE_OTHER): Payer: Medicaid Other

## 2018-08-23 ENCOUNTER — Ambulatory Visit: Payer: Medicaid Other

## 2018-08-23 VITALS — BP 121/77 | HR 72 | Wt 218.0 lb

## 2018-08-23 DIAGNOSIS — Z3042 Encounter for surveillance of injectable contraceptive: Secondary | ICD-10-CM

## 2018-08-23 MED ORDER — MEDROXYPROGESTERONE ACETATE 150 MG/ML IM SUSP
150.0000 mg | Freq: Once | INTRAMUSCULAR | Status: AC
  Administered 2018-08-23: 150 mg via INTRAMUSCULAR

## 2018-08-23 NOTE — Progress Notes (Signed)
DePO given in LD, tolerated well. Next DEPO 01/20-02/12/2018  Administrations This Visit    medroxyPROGESTERone (DEPO-PROVERA) injection 150 mg    Admin Date 08/23/2018 Action Given Dose 150 mg Route Intramuscular Administered By Tamela Oddi, RMA

## 2018-09-22 ENCOUNTER — Ambulatory Visit: Payer: Medicaid Other | Admitting: Certified Nurse Midwife

## 2018-10-06 ENCOUNTER — Ambulatory Visit (INDEPENDENT_AMBULATORY_CARE_PROVIDER_SITE_OTHER): Payer: Medicaid Other | Admitting: Obstetrics and Gynecology

## 2018-10-06 ENCOUNTER — Other Ambulatory Visit (HOSPITAL_COMMUNITY)
Admission: RE | Admit: 2018-10-06 | Discharge: 2018-10-06 | Disposition: A | Discharge status: Home / Self Care | Payer: Medicaid Other | Source: Ambulatory Visit | Attending: Obstetrics and Gynecology | Admitting: Obstetrics and Gynecology

## 2018-10-06 ENCOUNTER — Encounter: Payer: Self-pay | Admitting: Obstetrics and Gynecology

## 2018-10-06 DIAGNOSIS — Z202 Contact with and (suspected) exposure to infections with a predominantly sexual mode of transmission: Secondary | ICD-10-CM | POA: Insufficient documentation

## 2018-10-06 DIAGNOSIS — Z0000 Encounter for general adult medical examination without abnormal findings: Secondary | ICD-10-CM | POA: Diagnosis not present

## 2018-10-06 DIAGNOSIS — Z01419 Encounter for gynecological examination (general) (routine) without abnormal findings: Secondary | ICD-10-CM | POA: Insufficient documentation

## 2018-10-06 DIAGNOSIS — Z3042 Encounter for surveillance of injectable contraceptive: Secondary | ICD-10-CM | POA: Insufficient documentation

## 2018-10-06 MED ORDER — MEDROXYPROGESTERONE ACETATE 150 MG/ML IM SUSP: 150.0000 mg | INTRAMUSCULAR | 4 refills | Status: DC

## 2018-10-06 NOTE — Progress Notes (Signed)
Patient ID: Vallery Ridge, female   DOB: 11/25/1990, 27 y.o.   MRN: [id number]  [name] is a 27 y.o. [id number] female here for a routine annual gynecologic exam.  Current complaints: none.   Denies abnormal vaginal bleeding, discharge, pelvic pain, problems with intercourse or other gynecologic concerns.    Gynecologic History No LMP recorded. Patient has had an injection. Contraception: Depo-Provera injections Last Pap: 11/18. Results were: normal Last mammogram: NA. Results were: NA  Obstetric History OB History  Gravida Para Term Preterm AB Living  2 2 2  0 0 2  SAB TAB Ectopic Multiple Live Births  0 0 0 0 2    # Outcome Date GA Lbr Len/2nd Weight Sex Delivery Anes PTL Lv  2 Term 07/31/11 [redacted]w[redacted]d 04:00 / 00:12 7 lb 8.1 oz (3.405 kg) F Vag-Spont EPI  LIV     Birth Comments: none  1 Term 01/29/09     Vag-Spont   LIV    Past Medical History:  Diagnosis Date  . Convulsion, non-epileptic ([place])   . Seizures ([place])   . Seizures ([place])    "converted seizures", treated at [facility]    Past Surgical History:  Procedure Laterality Date  . MASS EXCISION N/A 09/13/2013   Procedure: EXCISION OF SOFT TISSUE MASS ON BACK;  Surgeon: Irene Limbo, MD;  Location: [place];  Service: Plastics;  Laterality: N/A;  . none    . WISDOM TOOTH EXTRACTION      Current Outpatient Medications on File Prior to Visit  Medication Sig Dispense Refill  . medroxyPROGESTERone (DEPO-PROVERA) 150 MG/ML injection Inject 1 mL (150 mg total) every 3 (three) months into the muscle. 1 mL 4   Current Facility-Administered Medications on File Prior to Visit  Medication Dose Route Frequency Provider Last Rate Last Dose  . medroxyPROGESTERone (DEPO-PROVERA) injection 150 mg  150 mg Intramuscular Q90 days Kandis Cocking A, CNM   150 mg at 12/14/17 1612    No Known Allergies  Social History   Socioeconomic History  . Marital status: Single    Spouse name: Not on file  . Number of  children: 1  . Years of education: Not on file  . Highest education level: Not on file  Occupational History    Comment: n/a  Social Needs  . Financial resource strain: Not on file  . Food insecurity:    Worry: Not on file    Inability: Not on file  . Transportation needs:    Medical: Not on file    Non-medical: Not on file  Tobacco Use  . Smoking status: Never Smoker  . Smokeless tobacco: Never Used  Substance and Sexual Activity  . Alcohol use: No    Alcohol/week: 4.0 standard drinks    Types: 4 Shots of liquor per week  . Drug use: No  . Sexual activity: Yes    Birth control/protection: Injection  Lifestyle  . Physical activity:    Days per week: Not on file    Minutes per session: Not on file  . Stress: Not on file  Relationships  . Social connections:    Talks on phone: Not on file    Gets together: Not on file    Attends religious service: Not on file    Active member of club or organization: Not on file    Attends meetings of clubs or organizations: Not on file    Relationship status: Not on file  . Intimate partner violence:  Fear of current or ex partner: Not on file    Emotionally abused: Not on file    Physically abused: Not on file    Forced sexual activity: Not on file  Other Topics Concern  . Not on file  Social History Narrative   ** Merged History Encounter **   Patient lives at home with her mother.   Caffeine Use: none        Family History  Problem Relation Age of Onset  . Hypertension Other   . CAD Other     The following portions of the patient's history were reviewed and updated as appropriate: allergies, current medications, past family history, past medical history, past social history, past surgical history and problem list.  Review of Systems Comprehensive ROS was negative except for constipation   Objective:  Ht 5\' 9"  (1.753 m)   Wt 222 lb 1.6 oz (100.7 kg)   BMI 32.80 kg/m  CONSTITUTIONAL: Well-developed, well-nourished  female in no acute distress.  HENT:  Normocephalic, atraumatic, External right and left ear normal. Oropharynx is clear and moist EYES: Conjunctivae and EOM are normal. Pupils are equal, round, and reactive to light. No scleral icterus.  NECK: Normal range of motion, supple, no masses.  Normal thyroid.  SKIN: Skin is warm and dry. No rash noted. Not diaphoretic. No erythema. No pallor. [place]: Alert and oriented to person, place, and time. Normal reflexes, muscle tone coordination. No cranial nerve deficit noted. PSYCHIATRIC: Normal mood and affect. Normal behavior. Normal judgment and thought content. CARDIOVASCULAR: Normal heart rate noted, regular rhythm RESPIRATORY: Clear to auscultation bilaterally. Effort and breath sounds normal, no problems with respiration noted. BREASTS: Symmetric in size. No masses, skin changes, nipple drainage, or lymphadenopathy. ABDOMEN: Soft, normal bowel sounds, no distention noted.  No tenderness, rebound or guarding.  PELVIC: Normal appearing external genitalia; normal appearing vaginal mucosa and cervix.  No abnormal discharge noted.   Normal uterine size, no other palpable masses, no uterine or adnexal tenderness. MUSCULOSKELETAL: Normal range of motion. No tenderness.  No cyanosis, clubbing, or edema.  2+ distal pulses.   Assessment:  Annual gynecologic examination with pap smear STD exposure Depo Provera Contraception Plan:  Pap smear not completed as not indicated. Continue with Depo Provera. STD testing as per pt request Routine preventative health maintenance measures emphasized. Please refer to After Visit Summary for other counseling recommendations.    Chancy Milroy, MD, [place] Attending [place] for [facility], [place]

## 2018-10-06 NOTE — Progress Notes (Signed)
Patient is in the office for annual, last pap 09-01-17. Pt is currently on depo Pt desires std testing today.

## 2018-10-06 NOTE — Patient Instructions (Signed)

## 2018-10-07 LAB — CERVICOVAGINAL ANCILLARY ONLY
Chlamydia: NEGATIVE
NEISSERIA GONORRHEA: NEGATIVE

## 2018-10-07 LAB — RPR: RPR: NONREACTIVE

## 2018-10-07 LAB — HIV ANTIBODY (ROUTINE TESTING W REFLEX): HIV SCREEN 4TH GENERATION: NONREACTIVE

## 2018-11-15 ENCOUNTER — Ambulatory Visit: Payer: Medicaid Other

## 2018-11-15 ENCOUNTER — Ambulatory Visit (INDEPENDENT_AMBULATORY_CARE_PROVIDER_SITE_OTHER): Payer: Medicaid Other

## 2018-11-15 DIAGNOSIS — Z3042 Encounter for surveillance of injectable contraceptive: Secondary | ICD-10-CM | POA: Diagnosis not present

## 2018-11-15 MED ORDER — MEDROXYPROGESTERONE ACETATE 150 MG/ML IM SUSP
150.0000 mg | Freq: Once | INTRAMUSCULAR | Status: AC
  Administered 2018-11-15: 150 mg via INTRAMUSCULAR

## 2018-11-15 NOTE — Progress Notes (Signed)
Pt is in office for depo injection. Pt is on time for depo. Injection given in R deltoid, pt tolerated well. Next injection due 4/14-4/28.

## 2018-12-10 ENCOUNTER — Emergency Department (HOSPITAL_COMMUNITY)
Admission: EM | Admit: 2018-12-10 | Discharge: 2018-12-10 | Disposition: A | Discharge status: Home / Self Care | Payer: Medicaid Other | Attending: Emergency Medicine | Admitting: Emergency Medicine

## 2018-12-10 ENCOUNTER — Encounter (HOSPITAL_COMMUNITY): Payer: Self-pay | Admitting: Emergency Medicine

## 2018-12-10 ENCOUNTER — Emergency Department (HOSPITAL_COMMUNITY): Payer: Medicaid Other

## 2018-12-10 ENCOUNTER — Other Ambulatory Visit: Payer: Self-pay

## 2018-12-10 DIAGNOSIS — Z79899 Other long term (current) drug therapy: Secondary | ICD-10-CM | POA: Insufficient documentation

## 2018-12-10 DIAGNOSIS — M5442 Lumbago with sciatica, left side: Secondary | ICD-10-CM | POA: Insufficient documentation

## 2018-12-10 DIAGNOSIS — M545 Low back pain: Secondary | ICD-10-CM | POA: Diagnosis present

## 2018-12-10 LAB — POC URINE PREG, ED: Preg Test, Ur: NEGATIVE

## 2018-12-10 MED ORDER — KETOROLAC TROMETHAMINE 15 MG/ML IJ SOLN
15.0000 mg | Freq: Once | INTRAMUSCULAR | Status: AC
  Administered 2018-12-10: 15 mg via INTRAMUSCULAR
  Filled 2018-12-10: qty 1

## 2018-12-10 MED ORDER — METHOCARBAMOL 500 MG PO TABS: 500.0000 mg | ORAL_TABLET | Freq: Two times a day (BID) | ORAL | 0 refills | Status: AC

## 2018-12-10 MED ORDER — LIDOCAINE 5 % EX PTCH
1.0000 | MEDICATED_PATCH | CUTANEOUS | Status: DC
  Administered 2018-12-10: 1 via TRANSDERMAL
  Filled 2018-12-10: qty 1

## 2018-12-10 NOTE — Discharge Instructions (Addendum)
You have been diagnosed today with Lower Back Pain with Left-Sided Sciatica.  At this time there does not appear to be the presence of an emergent medical condition, however there is always the potential for conditions to change. Please read and follow the below instructions.  Please return to the Emergency Department immediately for any new or worsening symptoms. Please be sure to follow up with your Primary Care Provider within one week regarding your visit today; please call their office to schedule an appointment even if you are feeling better for a follow-up visit. You have been given an anti-inflammatory shot today called Toradol.  Please avoid NSAID use including Aleve/ibuprofen/Motrin over the next 2 days as these work in a similar fashion.  Please drink plenty of water over the next few days. You have been prescribed Robaxin today, this is a muscle relaxing medication.  Do not drive or operate heavy machinery while taking this medication because it may make you sleepy. You may use the Lidoderm patch as prescribed for comfort.  Get help right away if: You develop new bowel or bladder control problems. You have unusual weakness or numbness in your arms or legs. You develop nausea or vomiting. You develop abdominal pain. You feel faint. You have pain at night. You lose weight without trying. You have a fever or chills. Get help right away if: You cannot control when you pee (urinate) or poop (have a bowel movement). You have weakness in any of these areas and it gets worse. Lower back. Lower belly (pelvis). Butt (buttocks). Legs. You have redness or swelling of your back. You have a burning feeling when you pee. Any other new or concerning symptoms.  Please read the additional information packets attached to your discharge summary.  Do not take your medicine if  develop an itchy rash, swelling in your mouth or lips, or difficulty breathing.

## 2018-12-10 NOTE — ED Triage Notes (Signed)
Pt reports lower back for a week and a half. Pt denies any injury. Pt reports relief by using OTC creams such as icy hot.

## 2018-12-10 NOTE — ED Provider Notes (Signed)
[facility] EMERGENCY DEPARTMENT Provider Note   CSN: [id number] Arrival date & time: 12/10/18  0709    History   Chief Complaint Chief Complaint  Patient presents with  . Back Pain    HPI [name] is a 28 y.o. female presenting today for a 1.5-week history of lower back pain.  Patient denies known injury or trauma.  Patient reports that she has recently begun a new job where she must lift often and feels that this is contributing to her symptoms today.  She describes a moderate in intensity bilateral lower back pain left side worse than the right that is constant and worsened when getting up to a standing position. She describes pain as an ache.  Pain is improved with rest and OTC IcyHot.  Patient endorses a tingling sensation around her left glutes when the pain was at its worst a few days ago however she denies this at rest, she denies any tingling sensation the past two days.  She denies saddle area paresthesias, bowel/bladder incontinence, urinary retention, extremity weakness, IV drug use, fever or history of cancer.     Back Pain  Pain location: Lower Back. Quality:  Aching Pain severity:  Moderate Pain is:  Worse during the day Onset quality:  Gradual Duration: 1.5 weeks. Progression:  Unchanged Chronicity:  New Context: lifting heavy objects   Context: not MVA, not recent illness and not recent injury   Relieved by:  Bed rest and OTC medications Worsened by:  Bending Associated symptoms: tingling   Associated symptoms: no abdominal pain, no bladder incontinence, no bowel incontinence, no chest pain, no dysuria, no fever, no headaches, no pelvic pain, no perianal numbness and no weakness  Numbness: Left Glute tingling.   Risk factors: no hx of cancer and not pregnant     Past Medical History:  Diagnosis Date  . 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 Seizures ([place])   . Seizures ([place])    "converted seizures", treated at [facility]    Patient  Active Problem List   Diagnosis Date Noted  . Well woman exam with routine gynecological exam 10/06/2018  . Exposure to STD 10/06/2018  . Surveillance for medroxyprogesterone contraception 10/06/2018  . Seizures ([place]) 06/15/2011    Past Surgical History:  Procedure Laterality Date  . MASS EXCISION N/A 09/13/2013   Procedure: EXCISION OF SOFT TISSUE MASS ON BACK;  Surgeon: Irene Limbo, MD;  Location: [place];  Service: Plastics;  Laterality: N/A;  . none    . WISDOM TOOTH EXTRACTION       OB History    Gravida  2   Para  2   Term  2   Preterm  0   AB  0   Living  2     SAB  0   TAB  0   Ectopic  0   Multiple  0   Live Births  2            Home Medications    Prior to Admission medications   Medication Sig Start Date End Date Taking? Authorizing Provider  medroxyPROGESTERone (DEPO-PROVERA) 150 MG/ML injection Inject 1 mL (150 mg total) into the muscle every 3 (three) months. 10/06/18   Chancy Milroy, MD  methocarbamol (ROBAXIN) 500 MG tablet Take 1 tablet (500 mg total) by mouth 2 (two) times daily for 7 days. 12/10/18 12/17/18  Deliah Boston, PA-C    Family History Family History  Problem Relation Age  of Onset  . Hypertension Other   . CAD Other     Social History Social History   Tobacco Use  . Smoking status: Never Smoker  . Smokeless tobacco: Never Used  Substance Use Topics  . Alcohol use: Yes    Alcohol/week: 4.0 standard drinks    Types: 4 Shots of liquor per week  . Drug use: No     Allergies   Patient has no known allergies.   Review of Systems Review of Systems  Constitutional: Negative.  Negative for chills and fever.  Eyes: Negative.  Negative for visual disturbance.  Respiratory: Negative.  Negative for cough and shortness of breath.   Cardiovascular: Negative.  Negative for chest pain.  Gastrointestinal: Negative.  Negative for abdominal pain, bowel incontinence, nausea and vomiting.    Genitourinary: Negative.  Negative for bladder incontinence, dysuria, hematuria, pelvic pain, vaginal bleeding and vaginal discharge.  Musculoskeletal: Positive for back pain. Negative for gait problem, neck pain and neck stiffness.  Neurological: Positive for tingling. Negative for dizziness, weakness and headaches. Numbness: Left Glute tingling.       Denies saddle area paresthesias Denies bowel/bladder incontinence Denies urinary retention   Physical Exam Updated Vital Signs BP (!) 111/98 (BP Location: Right Arm)   Pulse 83   Temp 98.4 F (36.9 C) (Oral)   Resp 18   Ht 5\' 9"  (1.753 m)   Wt 89.8 kg   SpO2 100%   BMI 29.24 kg/m   Physical Exam Constitutional:      General: She is not in acute distress.    Appearance: Normal appearance. She is not ill-appearing or diaphoretic.  HENT:     Head: Normocephalic and atraumatic. No raccoon eyes or Battle's sign.     Jaw: There is normal jaw occlusion. No trismus.     Right Ear: Tympanic membrane, ear canal and external ear normal. No hemotympanum.     Left Ear: Tympanic membrane, ear canal and external ear normal. No hemotympanum.     Nose: Nose normal.     Mouth/Throat:     Lips: Pink.     Mouth: Mucous membranes are moist.     Pharynx: Oropharynx is clear. Uvula midline.  Eyes:     General: Vision grossly intact. Gaze aligned appropriately.     Extraocular Movements: Extraocular movements intact.     Conjunctiva/sclera: Conjunctivae normal.     Pupils: Pupils are equal, round, and reactive to light.  Neck:     Musculoskeletal: Full passive range of motion without pain, normal range of motion and neck supple. No neck rigidity, spinous process tenderness or muscular tenderness.     Trachea: Trachea and phonation normal. No tracheal tenderness or tracheal deviation.  Cardiovascular:     Rate and Rhythm: Normal rate and regular rhythm.     Pulses:          Radial pulses are 2+ on the right side and 2+ on the left side.        Dorsalis pedis pulses are 2+ on the right side and 2+ on the left side.       Posterior tibial pulses are 2+ on the right side and 2+ on the left side.     Heart sounds: Normal heart sounds.  Pulmonary:     Effort: Pulmonary effort is normal. No respiratory distress.     Breath sounds: Normal breath sounds and air entry. No decreased breath sounds or rhonchi.  Abdominal:     General: Bowel sounds  are normal. There is no distension.     Palpations: Abdomen is soft. There is no pulsatile mass.     Tenderness: There is no abdominal tenderness. There is no right CVA tenderness, left CVA tenderness, guarding or rebound.  Musculoskeletal:       Back:     Comments: Diffuse lower back tenderness left greater than right. Patient does endorse some midline lumbar tenderness however states that it is worse over the left-sided musculature.  There is no cervical/thoracic midline spinal tenderness to palpation.  There is no crepitus, step-off or deformity of the C/T/L spine.  There is no sign of injury to the neck or back.  Pain is consistently reproduced with light palpation of the lumbar musculature.  Positive left straight leg raise at approximately 30 degrees.  No instability or tenderness with palpation of the hips.   Feet:     Right foot:     Protective Sensation: 3 sites tested. 3 sites sensed.     Left foot:     Protective Sensation: 3 sites tested. 3 sites sensed.  Skin:    General: Skin is warm and dry.     Capillary Refill: Capillary refill takes less than 2 seconds.  Neurological:     Mental Status: She is alert.     GCS: GCS eye subscore is 4. GCS verbal subscore is 5. GCS motor subscore is 6.     Comments: Speech is clear and goal oriented, follows commands Major Cranial nerves without deficit, no facial droop Normal strength in upper and lower extremities bilaterally including dorsiflexion and plantar flexion, strong and equal grip strength Sensation normal to light and sharp  touch Moves extremities without ataxia, coordination intact Normal bilateral patellar DTR Normal gait Normal heel-shin and balance  Psychiatric:        Behavior: Behavior normal. Behavior is cooperative.    ED Treatments / Results  Labs (all labs ordered are listed, but only abnormal results are displayed) Labs Reviewed  POC URINE PREG, ED    EKG None  Radiology Dg Lumbar Spine Complete  Result Date: 12/10/2018 CLINICAL DATA:  Low back pain for 1.5 weeks. EXAM: LUMBAR SPINE - COMPLETE 4+ VIEW COMPARISON:  Chest and abdomen x-ray June 08, 2016 FINDINGS: There is no evidence of lumbar spine fracture. Alignment is normal. Intervertebral disc spaces are maintained. IMPRESSION: Negative. Electronically Signed   By: Abelardo Diesel M.D.   On: 12/10/2018 08:35    Procedures Procedures (including critical care time)  Medications Ordered in ED Medications  lidocaine (LIDODERM) 5 % 1 patch (1 patch Transdermal Patch Applied 12/10/18 0913)  ketorolac (TORADOL) 15 MG/ML injection 15 mg (15 mg Intramuscular Given 12/10/18 8315)     Initial Impression / Assessment and Plan / ED Course  I have reviewed the triage vital signs and the nursing notes.  Pertinent labs & imaging results that were available during my care of the patient were reviewed by me and considered in my medical decision making (see chart for details).    [name] is a 28 y.o. female presenting with 1.5 weeks of lower back pain with resolved left sided sciatica symptoms.  Patient denies history of trauma, fever, IV drug use, night sweats, weight loss, cancer, saddle anesthesia, urinary rentention, bowel/bladder incontinence. No neurological deficits and normal neuro exam. Suspect musculoskeletal etiology of patient's pain. Pain is consistently reproducible with palpation of the back musculature and straight left leg raise. Abdomen soft/nontender and without pulsatile mass. Patient with equal pedal  pulses. Doubt  spinal epidural abscess, cauda equina or AAA.  Urine pregnancy Negative DG Lumbar spine Negative  Patient is ambulatory in the emergency department without assistance. RICE protocol and pain medicine indicated and discussed with patient.   Toradol 15mg  given. Patient denies history of CKD or gastric ulcers/bleeding. Robaxin 500mg  BID prescribed. Patient informed to avoid driving or operating heavy machinery while taking muscle relaxer. Lidoderm patch given for comfort. Patient also with sciatic-like symptoms 3 days ago.  Patient denies current sciatic-like symptoms.  Discussed steroids with the patient and she declines prednisone today. - Patient reassessed after Toradol, she is ambulating around emergency department unassisted and without distress.  She states that her pain has greatly improved since Toradol administration and she is requesting discharge.  At this time there does not appear to be any evidence of an acute emergency medical condition and the patient appears stable for discharge with appropriate outpatient follow up. Diagnosis was discussed with patient who verbalizes understanding of care plan and is agreeables to discharge. I have discussed return precautions with patient who verbalize understanding of return precautions. Patient strongly encouraged to follow-up with their PCP. All questions answered.  Note: Portions of this report may have been transcribed using voice recognition software. Every effort was made to ensure accuracy; however, inadvertent computerized transcription errors may still be present. Final Clinical Impressions(s) / ED Diagnoses   Final diagnoses:  Acute bilateral low back pain with left-sided sciatica    ED Discharge Orders         Ordered    methocarbamol (ROBAXIN) 500 MG tablet  2 times daily     12/10/18 0907           Gari Crown 12/10/18 2353    Jola Schmidt, MD 12/10/18 [phone]

## 2018-12-10 NOTE — ED Notes (Signed)
Patient verbalizes understanding of discharge instructions. Opportunity for questioning and answers were provided. Armband removed by staff, pt discharged from ED home via POV.  

## 2019-01-07 ENCOUNTER — Other Ambulatory Visit: Payer: Self-pay | Admitting: Obstetrics

## 2019-02-08 ENCOUNTER — Ambulatory Visit (INDEPENDENT_AMBULATORY_CARE_PROVIDER_SITE_OTHER): Payer: Medicaid Other

## 2019-02-08 ENCOUNTER — Other Ambulatory Visit: Payer: Self-pay

## 2019-02-08 VITALS — BP 117/82 | HR 96 | Ht 70.0 in | Wt 231.0 lb

## 2019-02-08 DIAGNOSIS — Z3042 Encounter for surveillance of injectable contraceptive: Secondary | ICD-10-CM

## 2019-02-08 MED ORDER — MEDROXYPROGESTERONE ACETATE 150 MG/ML IM SUSP
150.0000 mg | Freq: Once | INTRAMUSCULAR | Status: AC
  Administered 2019-02-08: 150 mg via INTRAMUSCULAR

## 2019-02-08 NOTE — Progress Notes (Signed)
Presented for DEPO, given in LD, tolerated well  Next DEPO July 7-21/2020  Administrations This Visit    medroxyPROGESTERone (DEPO-PROVERA) injection 150 mg    Admin Date 02/08/2019 Action Given Dose 150 mg Route Intramuscular Administered By Tamela Oddi, RMA

## 2019-05-04 ENCOUNTER — Ambulatory Visit (INDEPENDENT_AMBULATORY_CARE_PROVIDER_SITE_OTHER): Payer: Medicaid Other

## 2019-05-04 ENCOUNTER — Other Ambulatory Visit: Payer: Self-pay

## 2019-05-04 VITALS — BP 133/84 | HR 70 | Wt 238.0 lb

## 2019-05-04 DIAGNOSIS — Z3042 Encounter for surveillance of injectable contraceptive: Secondary | ICD-10-CM | POA: Diagnosis not present

## 2019-05-04 MED ORDER — MEDROXYPROGESTERONE ACETATE 150 MG/ML IM SUSP
150.0000 mg | Freq: Once | INTRAMUSCULAR | Status: AC
  Administered 2019-05-04: 150 mg via INTRAMUSCULAR

## 2019-05-04 NOTE — Progress Notes (Signed)
Presented for DEPO, Injection given in RD, tolerated well.  Next DEPO Sept. 30 - Oct. 14/2020  Administrations This Visit    medroxyPROGESTERone (DEPO-PROVERA) injection 150 mg    Admin Date 05/04/2019 Action Given Dose 150 mg Route Intramuscular Administered By Tamela Oddi, RMA

## 2019-05-30 ENCOUNTER — Emergency Department (HOSPITAL_COMMUNITY)
Admission: EM | Admit: 2019-05-30 | Discharge: 2019-05-30 | Disposition: A | Discharge status: Home / Self Care | Payer: BC Managed Care – PPO | Attending: Emergency Medicine | Admitting: Emergency Medicine

## 2019-05-30 ENCOUNTER — Encounter (HOSPITAL_COMMUNITY): Payer: Self-pay | Admitting: *Deleted

## 2019-05-30 ENCOUNTER — Other Ambulatory Visit: Payer: Self-pay

## 2019-05-30 DIAGNOSIS — M545 Low back pain, unspecified: Secondary | ICD-10-CM

## 2019-05-30 DIAGNOSIS — N390 Urinary tract infection, site not specified: Secondary | ICD-10-CM | POA: Diagnosis not present

## 2019-05-30 DIAGNOSIS — M549 Dorsalgia, unspecified: Secondary | ICD-10-CM | POA: Diagnosis present

## 2019-05-30 DIAGNOSIS — R202 Paresthesia of skin: Secondary | ICD-10-CM | POA: Diagnosis not present

## 2019-05-30 LAB — URINALYSIS, ROUTINE W REFLEX MICROSCOPIC
Bilirubin Urine: NEGATIVE
Glucose, UA: NEGATIVE mg/dL
Hgb urine dipstick: NEGATIVE
Ketones, ur: NEGATIVE mg/dL
Nitrite: NEGATIVE
Protein, ur: NEGATIVE mg/dL
Specific Gravity, Urine: 1.018 (ref 1.005–1.030)
pH: 5 (ref 5.0–8.0)

## 2019-05-30 LAB — PREGNANCY, URINE: Preg Test, Ur: NEGATIVE

## 2019-05-30 MED ORDER — NITROFURANTOIN MONOHYD MACRO 100 MG PO CAPS: 100.0000 mg | ORAL_CAPSULE | Freq: Two times a day (BID) | ORAL | 0 refills | Status: DC

## 2019-05-30 MED ORDER — METHOCARBAMOL 500 MG PO TABS: 500.0000 mg | ORAL_TABLET | Freq: Three times a day (TID) | ORAL | 0 refills | Status: DC | PRN

## 2019-05-30 NOTE — ED Triage Notes (Signed)
Pt states numbness to lower back and pressure when urinating since Friday.  Yesterday she began feeling numbness in her thighs, but denies difficulty with movement.

## 2019-05-30 NOTE — Discharge Instructions (Signed)
If you develop worsening, recurrent, or continued back pain, numbness or weakness in the legs, incontinence of your bowels or bladders, numbness of your buttocks, fever, abdominal pain, or any other new/concerning symptoms then return to the ER for evaluation.  

## 2019-05-30 NOTE — ED Provider Notes (Signed)
[facility] EMERGENCY DEPARTMENT Provider Note   CSN: [id number] Arrival date & time: 05/30/19  0700    History   Chief Complaint Chief Complaint  Patient presents with  . Back Pain  . Numbness    HPI [name] is a 28 y.o. female.     HPI  28 year old female presents with back pain.  Started a couple days ago.  She notices that it worsens with certain movements as well as with urinating, she will feel an increased pressure in her back.  Tried 800 mg ibuprofen once but it did not seem to help so she has not retried it.  No fevers.  No urinary symptoms otherwise such as burning.  Last night she felt some tingling/numbness in her bilateral thighs but this has spontaneously resolved.  Her legs are not numb now she does not feel any weakness.  The pain in her back does not radiate.  Past Medical History:  Diagnosis Date  . 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 Seizures ([place])   . Seizures ([place])    "converted seizures", treated at [facility]    Patient Active Problem List   Diagnosis Date Noted  . Well woman exam with routine gynecological exam 10/06/2018  . Exposure to STD 10/06/2018  . Surveillance for medroxyprogesterone contraception 10/06/2018  . Seizures ([place]) 06/15/2011    Past Surgical History:  Procedure Laterality Date  . MASS EXCISION N/A 09/13/2013   Procedure: EXCISION OF SOFT TISSUE MASS ON BACK;  Surgeon: Irene Limbo, MD;  Location: [place];  Service: Plastics;  Laterality: N/A;  . none    . WISDOM TOOTH EXTRACTION       OB History    Gravida  2   Para  2   Term  2   Preterm  0   AB  0   Living  2     SAB  0   TAB  0   Ectopic  0   Multiple  0   Live Births  2            Home Medications    Prior to Admission medications   Medication Sig Start Date End Date Taking? Authorizing Provider  ibuprofen (ADVIL,MOTRIN) 800 MG tablet TAKE 1 TABLET BY MOUTH EVERY 8 HOURS AS NEEDED 01/07/19    Shelly Bombard, MD  medroxyPROGESTERone (DEPO-PROVERA) 150 MG/ML injection Inject 1 mL (150 mg total) into the muscle every 3 (three) months. 10/06/18   Chancy Milroy, MD  methocarbamol (ROBAXIN) 500 MG tablet Take 1 tablet (500 mg total) by mouth every 8 (eight) hours as needed for muscle spasms. 05/30/19   Sherwood Gambler, MD  nitrofurantoin, macrocrystal-monohydrate, (MACROBID) 100 MG capsule Take 1 capsule (100 mg total) by mouth 2 (two) times daily for 3 days. 05/30/19 06/02/19  Sherwood Gambler, MD    Family History Family History  Problem Relation Age of Onset  . Hypertension Other   . CAD Other     Social History Social History   Tobacco Use  . Smoking status: Never Smoker  . Smokeless tobacco: Never Used  Substance Use Topics  . Alcohol use: Yes    Alcohol/week: 4.0 standard drinks    Types: 4 Shots of liquor per week  . Drug use: No     Allergies   Patient has no known allergies.   Review of Systems Review of Systems  Constitutional: Negative for fever.  Gastrointestinal: Negative for abdominal pain.  Genitourinary: Negative  for dysuria.  Musculoskeletal: Positive for back pain.  Neurological: Positive for numbness. Negative for weakness.  All other systems reviewed and are negative.    Physical Exam Updated Vital Signs BP 136/89 (BP Location: Right Arm)   Pulse 86   Temp 98.9 F (37.2 C) (Oral)   Resp 16   SpO2 97%   Physical Exam Vitals signs and nursing note reviewed.  Constitutional:      General: She is not in acute distress.    Appearance: She is well-developed. She is not ill-appearing or diaphoretic.  HENT:     Head: Normocephalic and atraumatic.     Right Ear: External ear normal.     Left Ear: External ear normal.     Nose: Nose normal.  Eyes:     General:        Right eye: No discharge.        Left eye: No discharge.  Cardiovascular:     Rate and Rhythm: Normal rate and regular rhythm.     Heart sounds: Normal heart sounds.   Pulmonary:     Effort: Pulmonary effort is normal.     Breath sounds: Normal breath sounds.  Abdominal:     Palpations: Abdomen is soft.     Tenderness: There is no abdominal tenderness.  Musculoskeletal:     Lumbar back: She exhibits tenderness (diffuse, midline and bilateral).  Skin:    General: Skin is warm and dry.  Neurological:     Mental Status: She is alert.     Comments: 5/5 strength in BLE. Normal gross sensation  Psychiatric:        Mood and Affect: Mood is not anxious.      ED Treatments / Results  Labs (all labs ordered are listed, but only abnormal results are displayed) Labs Reviewed  URINALYSIS, ROUTINE W REFLEX MICROSCOPIC - Abnormal; Notable for the following components:      Result Value   Leukocytes,Ua TRACE (*)    Bacteria, UA RARE (*)    All other components within normal limits  PREGNANCY, URINE    EKG None  Radiology No results found.  Procedures Procedures (including critical care time)  Medications Ordered in ED Medications - No data to display   Initial Impression / Assessment and Plan / ED Course  I have reviewed the triage vital signs and the nursing notes.  Pertinent labs & imaging results that were available during my care of the patient were reviewed by me and considered in my medical decision making (see chart for details).        Patient's vital signs are stable.  Her exam does not reveal any significant concern for cauda equina/spinal cord emergency.  No fevers.  Unclear if she was having true numbness or some tingling in her thighs but given the normal neuro exam currently I do not think she needs emergent MRI.  I discussed need for outpatient MRI.  With the pressure she feels with urinating, as well as 6-10 WBCs in the urine I think is reasonable to treat with short course of Macrobid for possible UTI.  Discussed return precautions.  Final Clinical Impressions(s) / ED Diagnoses   Final diagnoses:  Acute bilateral low back  pain without sciatica  Acute UTI (urinary tract infection)    ED Discharge Orders         Ordered    nitrofurantoin, macrocrystal-monohydrate, (MACROBID) 100 MG capsule  2 times daily     05/30/19 0851    methocarbamol (  ROBAXIN) 500 MG tablet  Every 8 hours PRN     05/30/19 0851           Sherwood Gambler, MD 05/30/19 [phone]

## 2019-05-31 ENCOUNTER — Emergency Department (HOSPITAL_COMMUNITY): Payer: BC Managed Care – PPO

## 2019-05-31 ENCOUNTER — Emergency Department (HOSPITAL_COMMUNITY)
Admission: EM | Admit: 2019-05-31 | Discharge: 2019-05-31 | Disposition: A | Discharge status: Home / Self Care | Payer: BC Managed Care – PPO | Attending: Emergency Medicine | Admitting: Emergency Medicine

## 2019-05-31 ENCOUNTER — Other Ambulatory Visit: Payer: Self-pay

## 2019-05-31 DIAGNOSIS — Z79899 Other long term (current) drug therapy: Secondary | ICD-10-CM | POA: Insufficient documentation

## 2019-05-31 DIAGNOSIS — R569 Unspecified convulsions: Secondary | ICD-10-CM | POA: Diagnosis not present

## 2019-05-31 DIAGNOSIS — M5126 Other intervertebral disc displacement, lumbar region: Secondary | ICD-10-CM | POA: Insufficient documentation

## 2019-05-31 DIAGNOSIS — M5127 Other intervertebral disc displacement, lumbosacral region: Secondary | ICD-10-CM

## 2019-05-31 DIAGNOSIS — M549 Dorsalgia, unspecified: Secondary | ICD-10-CM | POA: Diagnosis present

## 2019-05-31 LAB — URINALYSIS, ROUTINE W REFLEX MICROSCOPIC
Bilirubin Urine: NEGATIVE
Glucose, UA: NEGATIVE mg/dL
Hgb urine dipstick: NEGATIVE
Ketones, ur: NEGATIVE mg/dL
Leukocytes,Ua: NEGATIVE
Nitrite: NEGATIVE
Protein, ur: NEGATIVE mg/dL
Specific Gravity, Urine: 1.013 (ref 1.005–1.030)
pH: 7 (ref 5.0–8.0)

## 2019-05-31 MED ORDER — PREDNISONE 10 MG PO TABS: 20.0000 mg | ORAL_TABLET | Freq: Every day | ORAL | 0 refills | Status: AC

## 2019-05-31 MED ORDER — KETOROLAC TROMETHAMINE 30 MG/ML IJ SOLN
30.0000 mg | Freq: Once | INTRAMUSCULAR | Status: AC
  Administered 2019-05-31: 12:00:00 30 mg via INTRAMUSCULAR
  Filled 2019-05-31: qty 1

## 2019-05-31 MED ORDER — PREDNISONE 20 MG PO TABS
60.0000 mg | ORAL_TABLET | Freq: Once | ORAL | Status: AC
  Administered 2019-05-31: 60 mg via ORAL
  Filled 2019-05-31: qty 3

## 2019-05-31 NOTE — ED Triage Notes (Addendum)
Pt states she was seen yesterday for same and medications given were not effective. Woke up this morning with right leg numbness. Pt states she is able to walk, but is difficult due to back pain. Alert and oriented x4. Denies any acute changes with speech or ADLs.

## 2019-05-31 NOTE — ED Notes (Signed)
Patient transported to MRI 

## 2019-05-31 NOTE — ED Provider Notes (Addendum)
[facility] EMERGENCY DEPARTMENT Provider Note   CSN: [id number] Arrival date & time: 05/31/19  1059    History   Chief Complaint Chief Complaint  Patient presents with   Back Pain   Tingling    HPI [name] is a 28 y.o. female presenting to the emergency department for second visit regarding low back pain.  She was seen yesterday in the ED for low back pain with resolved bilateral tingling/numbness sensation in her thighs.  She also had some pressure sensation when she urinates in her back, and was treated with Macrobid.  There is no urine culture available.  She states she has been taking the Robaxin she was prescribed yesterday, however this is not providing any pain relief.  She has not been taking any other additional analgesic.  She has been applying heat which provides some temporary relief.  She presents today with persistent pain in her low back, worse on her right side in her gluteal region.  She has pain in her right leg with ambulation, however no weakness.  She has some tingling sensation in her right leg all the way down to her ankle.  She states it is mostly on the anterior aspect.  She denies saddle paresthesia, fever, history of cancer, history of IV drug use, bowel or bladder incontinence.  New injury since yesterday.  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     The history is provided by the patient and medical records.    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[place])    Seizures ([place])    Seizures ([place])    "converted seizures", treated at [facility]    Patient Active Problem List   Diagnosis Date Noted   Well woman exam with routine gynecological exam 10/06/2018   Exposure to STD 10/06/2018   Surveillance for medroxyprogesterone contraception 10/06/2018   Seizures ([place]) 06/15/2011    Past Surgical History:  Procedure Laterality Date   MASS EXCISION N/A 09/13/2013   Procedure: EXCISION OF SOFT TISSUE MASS ON BACK;  Surgeon: Irene Limbo, MD;  Location: [facility];  Service: Plastics;  Laterality: N/A;   none     WISDOM TOOTH EXTRACTION       OB History    Gravida  2   Para  2   Term  2   Preterm  0   AB  0   Living  2     SAB  0   TAB  0   Ectopic  0   Multiple  0   Live Births  2            Home Medications    Prior to Admission medications   Medication Sig Start Date End Date Taking? Authorizing Provider  medroxyPROGESTERone (DEPO-PROVERA) 150 MG/ML injection Inject 1 mL (150 mg total) into the muscle every 3 (three) months. 10/06/18  Yes Chancy Milroy, MD  predniSONE (DELTASONE) 10 MG tablet Take 2 tablets (20 mg total) by mouth daily for 5 days. 06/01/19 06/06/19  Audrey Thull, [place] N, PA-C    Family History Family History  Problem Relation Age of Onset   Hypertension Other    CAD Other     Social History Social History   Tobacco Use   Smoking status: Never Smoker   Smokeless tobacco: Never Used  Substance Use Topics   Alcohol use: Yes    Alcohol/week: 4.0 standard drinks  Types: 4 Shots of liquor per week   Drug use: No     Allergies   Patient has no known allergies.   Review of Systems Review of Systems  Musculoskeletal: Positive for back pain.  All other systems reviewed and are negative.    Physical Exam Updated Vital Signs BP 120/63 (BP Location: Right Arm)    Pulse 84    Temp 99.4 F (37.4 C) (Oral)    Resp 18    SpO2 100%   Physical Exam Vitals signs and nursing note reviewed.  Constitutional:      General: She is not in acute distress.    Appearance: She is well-developed. She is not ill-appearing.  HENT:     Head: Normocephalic and atraumatic.  Eyes:     Conjunctiva/sclera: Conjunctivae normal.  Cardiovascular:     Rate and Rhythm: Normal rate and regular rhythm.  Pulmonary:     Effort: Pulmonary effort is normal. No respiratory distress.      Breath sounds: Normal breath sounds.  Abdominal:     General: Bowel sounds are normal.     Palpations: Abdomen is soft.     Tenderness: There is no abdominal tenderness. There is no guarding or rebound.  Musculoskeletal:       Back:     Comments: Generalized L-spine tenderness as well as right gluteal region.  No bony step-offs or gross deformities.  Skin:    General: Skin is warm.  Neurological:     Mental Status: She is alert.     Comments: Normal tone.  5/5 strength in BUE and BLE including strong and equal grip strength and dorsiflexion/plantar flexion Sensory: Patient reports slightly decreased sensation on right anterior thigh, lateral lower leg and medial ankle. Normal and equal patellar reflexes bilaterally. Gait: normal gait and balance CV: distal pulses palpable throughout    Psychiatric:        Behavior: Behavior normal.      ED Treatments / Results  Labs (all labs ordered are listed, but only abnormal results are displayed) Labs Reviewed  URINE CULTURE  URINALYSIS, ROUTINE W REFLEX MICROSCOPIC    EKG None  Radiology Mr Lumbar Spine Wo Contrast  Result Date: 05/31/2019 CLINICAL DATA:  Low back pain.  Right leg numbness today. EXAM: MRI LUMBAR SPINE WITHOUT CONTRAST TECHNIQUE: Multiplanar, multisequence MR imaging of the lumbar spine was performed. No intravenous contrast was administered. COMPARISON:  Lumbar spine radiographs 12/10/2018 FINDINGS: Segmentation:  Standard. Alignment:  Trace retrolisthesis of L5 on S1. Vertebrae: No fracture, suspicious osseous lesion, or significant marrow edema. Conus medullaris and cauda equina: Conus extends to the L2 level. Conus and cauda equina appear normal. Paraspinal and other soft tissues: Unremarkable. Disc levels: L1-2: Negative. L2-3 and L3-4: Normal discs. At most mild facet hypertrophy without stenosis. L4-5: Normal disc. Mild facet hypertrophy with trace facet joint effusions. No stenosis. L5-S1: Mild disc desiccation.  A broad central disc protrusion with annular fissure may contact and irritate the S1 nerve roots in the spinal canal, however there is no significant stenosis or evidence of neural compression. Mild facet hypertrophy is noted with trace facet joint effusions. IMPRESSION: 1. Broad central disc protrusion at L5-S1 with annular fissure in close proximity to the S1 nerve roots. No stenosis. 2. Mild lower lumbar facet hypertrophy. Electronically Signed   By: Logan Bores M.D.   On: 05/31/2019 16:16    Procedures Procedures (including critical care time)  Medications Ordered in ED Medications  ketorolac (TORADOL) 30 MG/ML injection  30 mg (30 mg Intramuscular Given 05/31/19 1153)  predniSONE (DELTASONE) tablet 60 mg (60 mg Oral Given 05/31/19 1153)     Initial Impression / Assessment and Plan / ED Course  I have reviewed the triage vital signs and the nursing notes.  Pertinent labs & imaging results that were available during my care of the patient were reviewed by me and considered in my medical decision making (see chart for details).  Clinical Course as of May 30 1900  Tue May 31, 2019  1309 Was preparing patient for discharge, however there was a repeat temperature obtained by nursing which revealed a temperature of 100 F.  On my recheck temperature is 99, however this is after Toradol.  This was discussed with Dr. Billy Fischer, who recommends we proceed with imaging.   [JR]  1657 Results discussed with reading radiologist, Dr. Jeralyn Ruths.  Entry error for order, intended to order MRI with and without to evaluate for infection.  Discussed this with radiologist who took second look at imaging.  He states there is no indication or any findings that would suggest any infectious etiology.  He states though the imaging was done without contrast, he does not believe this limits the ability to rule out infection by review of her images today  MR Lumbar Spine Wo Contrast [JR]    Clinical Course User  Index [JR] Chaze Hruska, [place] N, PA-C       Patient presenting for second visit with low back pain with some tingling sensation down the right leg and some decreased sensation.  Initially, patient is afebrile with stable vital signs, no red flags.,  However nursing rechecked a temperature and noted to have a temperature 100 F.  Proceeded with MRI imaging.  Pain treated with Toradol and prednisone with some improvement.  Urine is negative. Pt discussed with Dr. Billy Fischer.   MRI with L5-S1 disc protrusion.  Was in order entry error by myself, intended to order MRI with and without contrast.  Discussed over the phone with reading radiologist, Dr. Jeralyn Ruths who reviewed images once more over the phone.  He reports there is indication or findings that would be suggestive of infection and feels confident with this.  He states patient does not need further imaging to rule out infection. Pt has no other systemic symptoms and presentation is most consistent with disc protrusion as described on MRI. Believe pt is safe for discharge with symptomatic management and outpatient follow.   Results discussed with patient.  Plan to treat outpatient with steroids, NSAIDs, Tylenol, continue Robaxin.  Instructed to continue antibiotics.  Neurosurgery referral provided for follow-up.  Strict return precautions.  Patient is agreeable to plan and safe for discharge.  Discussed results, findings, treatment and follow up. Patient advised of return precautions. Patient verbalized understanding and agreed with plan.  Final Clinical Impressions(s) / ED Diagnoses   Final diagnoses:  Herniation of intervertebral disc between L5 and S1    ED Discharge Orders         Ordered    predniSONE (DELTASONE) 10 MG tablet  Daily     05/31/19 1735           Tekisha Darcey, [place] N, PA-C 05/31/19 1901    Annalisse Minkoff, [place] N, PA-C 05/31/19 1904    Gareth Morgan, MD 06/02/19 [phone]

## 2019-05-31 NOTE — Discharge Instructions (Signed)
Please read instructions below.  You can take 600 mg of ibuprofen every 6 hours as needed for pain.  You can take tylenol in addition to this for added pain relief. Continue taking the Robaxin as prescribed. Starting tomorrow, take the prednisone as prescribed until gone. Finish your antibiotic as prescribed. Apply ice to your back for 20 minutes at a time.  You can also apply heat if this provides more relief.   Get an appointment with the neurosurgeon/spine specialist to discuss your diagnosis and further management. Return to ER if new weakness in your legs, inability to urinate, inability to hold your bowels, or new or concerning symptoms.

## 2019-06-01 LAB — URINE CULTURE

## 2019-07-27 ENCOUNTER — Ambulatory Visit (INDEPENDENT_AMBULATORY_CARE_PROVIDER_SITE_OTHER): Payer: BC Managed Care – PPO

## 2019-07-27 ENCOUNTER — Other Ambulatory Visit: Payer: Self-pay

## 2019-07-27 DIAGNOSIS — Z3042 Encounter for surveillance of injectable contraceptive: Secondary | ICD-10-CM | POA: Diagnosis not present

## 2019-07-27 NOTE — Progress Notes (Signed)
Pt is in the office for depo injection, administered in L Del and pt tolerated well. Next due Dec 23- Jan 6. .. Administrations This Visit    medroxyPROGESTERone (DEPO-PROVERA) injection 150 mg    Admin Date 07/27/2019 Action Given Dose 150 mg Route Intramuscular Administered By Hinton Lovely, RN

## 2019-07-28 NOTE — Progress Notes (Signed)
Patient seen and assessed by nursing staff during this encounter. I have reviewed the chart and agree with the documentation and plan.  Mora Bellman, MD 07/28/2019 10:53 AM

## 2019-08-03 ENCOUNTER — Other Ambulatory Visit: Payer: Self-pay

## 2019-08-03 DIAGNOSIS — Z20822 Contact with and (suspected) exposure to covid-19: Secondary | ICD-10-CM

## 2019-08-05 LAB — NOVEL CORONAVIRUS, NAA: SARS-CoV-2, NAA: NOT DETECTED

## 2019-10-17 ENCOUNTER — Other Ambulatory Visit: Payer: Self-pay

## 2019-10-17 DIAGNOSIS — Z3042 Encounter for surveillance of injectable contraceptive: Secondary | ICD-10-CM

## 2019-10-17 MED ORDER — MEDROXYPROGESTERONE ACETATE 150 MG/ML IM SUSP: 150.0000 mg | INTRAMUSCULAR | 4 refills | Status: DC

## 2019-10-19 ENCOUNTER — Ambulatory Visit (INDEPENDENT_AMBULATORY_CARE_PROVIDER_SITE_OTHER): Payer: BC Managed Care – PPO | Admitting: *Deleted

## 2019-10-19 ENCOUNTER — Other Ambulatory Visit: Payer: Self-pay

## 2019-10-19 DIAGNOSIS — Z3042 Encounter for surveillance of injectable contraceptive: Secondary | ICD-10-CM

## 2019-10-19 NOTE — Progress Notes (Signed)
Pt is in office for depo injection. Pt is on time for Depo.  Injection given, pt tolerated well. Pt advised to RTO 3/17-3/31/2021 for next depo.  Pt has no other concerns today.  Administrations This Visit    medroxyPROGESTERone (DEPO-PROVERA) injection 150 mg    Admin Date 10/19/2019 Action Given Dose 150 mg Route Intramuscular Administered By Valene Bors, CMA

## 2019-10-20 NOTE — Progress Notes (Signed)
Patient seen and assessed by nursing staff during this encounter. I have reviewed the chart and agree with the documentation and plan.  Mora Bellman, MD 10/20/2019 10:46 AM

## 2020-01-11 ENCOUNTER — Ambulatory Visit: Payer: Medicaid Other

## 2020-01-16 ENCOUNTER — Other Ambulatory Visit: Payer: Self-pay

## 2020-01-16 ENCOUNTER — Ambulatory Visit (INDEPENDENT_AMBULATORY_CARE_PROVIDER_SITE_OTHER): Payer: Medicaid Other

## 2020-01-16 VITALS — BP 122/80 | HR 75 | Wt 237.4 lb

## 2020-01-16 DIAGNOSIS — Z3042 Encounter for surveillance of injectable contraceptive: Secondary | ICD-10-CM

## 2020-01-16 MED ORDER — MEDROXYPROGESTERONE ACETATE 150 MG/ML IM SUSP
150.0000 mg | Freq: Once | INTRAMUSCULAR | Status: AC
  Administered 2020-01-16: 09:00:00 150 mg via INTRAMUSCULAR

## 2020-01-16 NOTE — Progress Notes (Signed)
Presents for DEPO Injection, given in RD, tolerated well.  Next DEPO  June 14-28/2021  Administrations This Visit    medroxyPROGESTERone (DEPO-PROVERA) injection 150 mg    Admin Date 01/16/2020 Action Given Dose 150 mg Route Intramuscular Administered By Tamela Oddi, RMA

## 2020-01-25 ENCOUNTER — Emergency Department (HOSPITAL_COMMUNITY)
Admission: EM | Admit: 2020-01-25 | Discharge: 2020-01-25 | Disposition: A | Discharge status: Home / Self Care | Payer: Medicaid Other | Attending: Emergency Medicine | Admitting: Emergency Medicine

## 2020-01-25 ENCOUNTER — Encounter (HOSPITAL_COMMUNITY): Payer: Self-pay | Admitting: Emergency Medicine

## 2020-01-25 ENCOUNTER — Other Ambulatory Visit: Payer: Self-pay

## 2020-01-25 DIAGNOSIS — L03031 Cellulitis of right toe: Secondary | ICD-10-CM | POA: Insufficient documentation

## 2020-01-25 DIAGNOSIS — L539 Erythematous condition, unspecified: Secondary | ICD-10-CM | POA: Diagnosis present

## 2020-01-25 MED ORDER — CEPHALEXIN 500 MG PO CAPS: 1000.0000 mg | ORAL_CAPSULE | Freq: Two times a day (BID) | ORAL | 0 refills | Status: DC

## 2020-01-25 MED ORDER — LIDOCAINE HCL 2 % IJ SOLN
20.0000 mL | Freq: Once | INTRAMUSCULAR | Status: AC
  Administered 2020-01-25: 400 mg via INTRADERMAL
  Filled 2020-01-25: qty 20

## 2020-01-25 NOTE — ED Notes (Signed)
Patient verbalizes understanding of discharge instructions. Opportunity for questioning and answers were provided. Armband removed by staff. Patient discharged from ED.  

## 2020-01-25 NOTE — Discharge Instructions (Signed)
Soak the toe in warm water and peroxide or warm water and Epsom salts.  Return here for any worsening in your condition.  Tylenol and Motrin for any pain

## 2020-01-25 NOTE — ED Provider Notes (Signed)
[facility] EMERGENCY DEPARTMENT Provider Note   CSN: [id number] Arrival date & time: 01/25/20  2033     History Chief Complaint  Patient presents with  . Toe infection    [name] is a 29 y.o. female.  HPI Patient presents to the emergency department with swelling and drainage from her right great toe.  The patient states that she noticed that 2 days ago she tried to trim back the nail she states that there are times she pushes on the nail there be pus that came from underneath the nail and around the edge.  The patient states that she did not injure the toe or nail in any way.  Patient states that she did not take any medications prior to arrival for her symptoms.    Past Medical History:  Diagnosis Date  . 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 Seizures ([place])   . Seizures ([place])    "converted seizures", treated at [facility]    Patient Active Problem List   Diagnosis Date Noted  . Well woman exam with routine gynecological exam 10/06/2018  . Exposure to STD 10/06/2018  . Surveillance for medroxyprogesterone contraception 10/06/2018  . Seizures ([place]) 06/15/2011    Past Surgical History:  Procedure Laterality Date  . MASS EXCISION N/A 09/13/2013   Procedure: EXCISION OF SOFT TISSUE MASS ON BACK;  Surgeon: Irene Limbo, MD;  Location: [facility];  Service: Plastics;  Laterality: N/A;  . none    . WISDOM TOOTH EXTRACTION       OB History    Gravida  2   Para  2   Term  2   Preterm  0   AB  0   Living  2     SAB  0   TAB  0   Ectopic  0   Multiple  0   Live Births  2           Family History  Problem Relation Age of Onset  . Hypertension Other   . CAD Other     Social History   Tobacco Use  . Smoking status: Never Smoker  . Smokeless tobacco: Never Used  Substance Use Topics  . Alcohol use: Yes    Alcohol/week: 4.0 standard drinks    Types: 4 Shots of liquor per week  . Drug use: No    Home  Medications Prior to Admission medications   Medication Sig Start Date End Date Taking? Authorizing Provider  medroxyPROGESTERone (DEPO-PROVERA) 150 MG/ML injection Inject 1 mL (150 mg total) into the muscle every 3 (three) months. 10/17/19   Woodroe Mode, MD    Allergies    Patient has no known allergies.  Review of Systems   Review of Systems All other systems negative except as documented in the HPI. All pertinent positives and negatives as reviewed in the HPI. Physical Exam Updated Vital Signs BP 114/87 (BP Location: Right Arm)   Pulse 79   Temp 99 F (37.2 C) (Oral)   Resp 16   Ht 5\' 9"  (1.753 m)   Wt 104.3 kg   SpO2 100%   BMI 33.97 kg/m   Physical Exam Vitals and nursing note reviewed.  Constitutional:      General: She is not in acute distress.    Appearance: She is well-developed.  HENT:     Head: Normocephalic and atraumatic.  Eyes:     Pupils: Pupils are equal, round, and reactive to light.  Pulmonary:     Effort: Pulmonary effort is normal.  Musculoskeletal:       Feet:  Skin:    General: Skin is warm and dry.  Neurological:     Mental Status: She is alert and oriented to person, place, and time.     ED Results / Procedures / Treatments   Labs (all labs ordered are listed, but only abnormal results are displayed) Labs Reviewed - No data to display  EKG None  Radiology No results found.  Procedures Procedures (including critical care time)  Medications Ordered in ED Medications  lidocaine (XYLOCAINE) 2 % (with pres) injection 400 mg (has no administration in time range)    ED Course  I have reviewed the triage vital signs and the nursing notes.  Pertinent labs & imaging results that were available during my care of the patient were reviewed by me and considered in my medical decision making (see chart for details).    MDM Rules/Calculators/A&P                      I attempted to excise the nail from the skin but the nail was not  into the skin as it initially appeared.  I cleaned the area and did open up an area that appeared to have pus draining from it.  Express more drainage from the area.  Cleansed the area with peroxide and water.  Advised patient to soak the toe in warm water and Epsom salts.  Told to keep the area clean and dry.  Patient agrees the plan and all questions were answered.  We will place the patient on Keflex and told to return here for any worsening in her condition Final Clinical Impression(s) / ED Diagnoses Final diagnoses:  None    Rx / DC Orders ED Discharge Orders    None       Rebeca Allegra 01/25/20 2149    Dorie Rank, MD 01/26/20 1030

## 2020-01-25 NOTE — ED Triage Notes (Signed)
  Patient comes in with R great toe pain and possible infection.  Patient states about 3 days ago she noticed pus on the R side of her toenail, so she cut it to drain it.  Patient states it has started getting worse and now the drainage has a bad smell. Pain 5/10.

## 2020-02-10 DIAGNOSIS — Z23 Encounter for immunization: Secondary | ICD-10-CM | POA: Diagnosis not present

## 2020-03-09 DIAGNOSIS — Z23 Encounter for immunization: Secondary | ICD-10-CM | POA: Diagnosis not present

## 2020-04-05 ENCOUNTER — Ambulatory Visit (INDEPENDENT_AMBULATORY_CARE_PROVIDER_SITE_OTHER): Payer: Medicaid Other

## 2020-04-05 ENCOUNTER — Other Ambulatory Visit: Payer: Self-pay

## 2020-04-05 VITALS — BP 120/82 | HR 85 | Ht 69.0 in | Wt 244.0 lb

## 2020-04-05 DIAGNOSIS — Z3042 Encounter for surveillance of injectable contraceptive: Secondary | ICD-10-CM

## 2020-04-05 MED ORDER — MEDROXYPROGESTERONE ACETATE 150 MG/ML IM SUSP
150.0000 mg | Freq: Once | INTRAMUSCULAR | Status: AC
  Administered 2020-04-05: 150 mg via INTRAMUSCULAR

## 2020-04-05 NOTE — Progress Notes (Signed)
GYN presents for DEPO, given in LD, tolerated well.  Next DEPO Sept. 2-16, 2021  Administrations This Visit    medroxyPROGESTERone (DEPO-PROVERA) injection 150 mg    Admin Date 04/05/2020 Action Given Dose 150 mg Route Intramuscular Administered By Tamela Oddi, RMA

## 2020-04-05 NOTE — Progress Notes (Signed)
I have reviewed this chart and agree with the RN/CMA assessment and management.    K. Meryl Karl Knarr, M.D. Attending [facility] (Faculty Practice)   

## 2020-06-20 ENCOUNTER — Other Ambulatory Visit: Payer: Self-pay | Admitting: Obstetrics

## 2020-06-22 ENCOUNTER — Ambulatory Visit (INDEPENDENT_AMBULATORY_CARE_PROVIDER_SITE_OTHER): Payer: Medicaid Other

## 2020-06-22 ENCOUNTER — Other Ambulatory Visit: Payer: Self-pay

## 2020-06-22 DIAGNOSIS — Z3042 Encounter for surveillance of injectable contraceptive: Secondary | ICD-10-CM | POA: Diagnosis not present

## 2020-06-22 NOTE — Progress Notes (Signed)
Pt is in the office for depo injection, administered in R Del and pt tolerated well. Next due Nov 19- Dec 3 .. Administrations This Visit    medroxyPROGESTERone (DEPO-PROVERA) injection 150 mg    Admin Date 06/22/2020 Action Given Dose 150 mg Route Intramuscular Administered By Hinton Lovely, RN

## 2020-06-22 NOTE — Progress Notes (Signed)
Agree with A & P. 

## 2020-06-29 DIAGNOSIS — X58XXXA Exposure to other specified factors, initial encounter: Secondary | ICD-10-CM | POA: Diagnosis not present

## 2020-06-29 DIAGNOSIS — M25561 Pain in right knee: Secondary | ICD-10-CM | POA: Diagnosis not present

## 2020-06-29 DIAGNOSIS — S8391XA Sprain of unspecified site of right knee, initial encounter: Secondary | ICD-10-CM | POA: Diagnosis not present

## 2020-07-12 DIAGNOSIS — M2391 Unspecified internal derangement of right knee: Secondary | ICD-10-CM | POA: Diagnosis not present

## 2020-07-13 ENCOUNTER — Other Ambulatory Visit: Payer: Self-pay | Admitting: Orthopedic Surgery

## 2020-07-13 DIAGNOSIS — M25561 Pain in right knee: Secondary | ICD-10-CM

## 2020-07-13 DIAGNOSIS — R531 Weakness: Secondary | ICD-10-CM

## 2020-07-13 DIAGNOSIS — R29898 Other symptoms and signs involving the musculoskeletal system: Secondary | ICD-10-CM

## 2020-08-07 ENCOUNTER — Inpatient Hospital Stay: Admission: RE | Admit: 2020-08-07 | Payer: Medicaid Other | Source: Ambulatory Visit

## 2020-08-07 DIAGNOSIS — R600 Localized edema: Secondary | ICD-10-CM | POA: Diagnosis not present

## 2020-08-07 DIAGNOSIS — M67961 Unspecified disorder of synovium and tendon, right lower leg: Secondary | ICD-10-CM | POA: Diagnosis not present

## 2020-08-13 DIAGNOSIS — M25861 Other specified joint disorders, right knee: Secondary | ICD-10-CM | POA: Diagnosis not present

## 2020-08-20 ENCOUNTER — Encounter: Payer: Self-pay | Admitting: Nurse Practitioner

## 2020-08-20 ENCOUNTER — Ambulatory Visit (INDEPENDENT_AMBULATORY_CARE_PROVIDER_SITE_OTHER): Payer: 59 | Admitting: Nurse Practitioner

## 2020-08-20 ENCOUNTER — Other Ambulatory Visit: Payer: Self-pay

## 2020-08-20 VITALS — BP 116/80 | HR 76 | Temp 98.4°F | Ht 69.0 in | Wt 227.8 lb

## 2020-08-20 DIAGNOSIS — F1010 Alcohol abuse, uncomplicated: Secondary | ICD-10-CM | POA: Diagnosis not present

## 2020-08-20 DIAGNOSIS — F419 Anxiety disorder, unspecified: Secondary | ICD-10-CM

## 2020-08-20 DIAGNOSIS — Z13228 Encounter for screening for other metabolic disorders: Secondary | ICD-10-CM | POA: Diagnosis not present

## 2020-08-20 DIAGNOSIS — Z7689 Persons encountering health services in other specified circumstances: Secondary | ICD-10-CM

## 2020-08-20 DIAGNOSIS — F331 Major depressive disorder, recurrent, moderate: Secondary | ICD-10-CM | POA: Diagnosis not present

## 2020-08-20 DIAGNOSIS — Z87898 Personal history of other specified conditions: Secondary | ICD-10-CM

## 2020-08-20 DIAGNOSIS — Z86718 Personal history of other venous thrombosis and embolism: Secondary | ICD-10-CM

## 2020-08-20 MED ORDER — MAGNESIUM 250 MG PO TABS: 1.0000 | ORAL_TABLET | Freq: Every evening | ORAL | 1 refills | Status: DC

## 2020-08-20 MED ORDER — CITALOPRAM HYDROBROMIDE 10 MG PO TABS: 10.0000 mg | ORAL_TABLET | Freq: Every day | ORAL | 2 refills | Status: DC

## 2020-08-20 NOTE — Progress Notes (Signed)
I,Yamilka Roman [employer] as a Neurosurgeon for [employer], FNP.,have documented all relevant documentation on the behalf of Arnette Felts, FNP,as directed by  Arnette Felts, FNP while in the presence of Arnette Felts, FNP. This visit occurred during the SARS-CoV-2 public health emergency.  Safety protocols were in place, including screening questions prior to the visit, additional usage of staff PPE, and extensive cleaning of exam room while observing appropriate contact time as indicated for disinfecting solutions.  Subjective:     Patient ID: [name] , female    DOB: 1991/04/10 , 29 y.o.   MRN: [id number]   Chief Complaint  Patient presents with  . Establish Care  . Anxiety  . Depression    HPI  Here to establish care. She has been a patient here previously. She had been seeing her OB/GYN - at [facility] last seen 2 months ago, she has her depo injection there.  She is working at [employer] in [place].  Single.  She has 2 daughters - 81 y/o and 94 y/o both have asthma.  She is engaged to be married.   PMH- coverted seizures has been years since having.  She has never taken any medication. She was treated at [facility] - mother - [place] Anon - anxiety, vertigo. Father - depression.  Brother - healthy.  Maternal grandfather - rheumatoid arthritis and kidney failure (deceased)  Maternal Grandmother - dementia (deceased)  She noticed her anxiety worsens when she is closed in she is unable to focus or pass out.  She has never seen a counselor - admits that she does not like to talk to people.  She has never taken any medications.    Anxiety Presents for initial visit. Onset was 1 to 6 months ago (when she had to go in Covid rooms - she will have to wear a mask. she will feel like she is about to pass out). Symptoms include nervous/anxious behavior and panic. Patient reports no chest pain, decreased concentration, dizziness, insomnia, nausea, restlessness or shortness of  breath. The quality of sleep is poor (schedule is now 330 - 1130 she will stay awake until her daughters go to school.  Her nephew is in her custody since April 2021 who is 30 y/o ADHD and Autism).   Her past medical history is significant for depression and suicide attempts (one).  Depression      The patient presents with depression.  This is a chronic problem.  Episode onset: started in 2017 when her brother went to prison and her grandfather passed away is when it started.    The onset quality is sudden.   Associated symptoms include no decreased concentration, no fatigue, does not have insomnia, no restlessness and no headaches.     The symptoms are aggravated by nothing.  Risk factors include alcohol intake and history of suicide attempt (she has attempted suicide in the past 2019 - she cut her wrist and wanted to jump off a bridge - she was not admitted at that time. ).   Past medical history includes anxiety, depression and suicide attempts (one).      Past Medical History:  Diagnosis Date  . Convulsion, non-epileptic (HCC)   . Seizures (HCC)   . Seizures (HCC)    "converted seizures", treated at [facility]     History reviewed. No pertinent family history.   Current Outpatient Medications:  .  medroxyPROGESTERone (DEPO-PROVERA) 150 MG/ML injection, Inject 1 mL (150 mg total) into the muscle  every 3 (three) months., Disp: 1 mL, Rfl: 4 .  citalopram (CELEXA) 10 MG tablet, Take 1 tablet (10 mg total) by mouth daily., Disp: 30 tablet, Rfl: 2 .  Magnesium 250 MG TABS, Take 1 tablet (250 mg total) by mouth every evening., Disp: 90 tablet, Rfl: 1  Current Facility-Administered Medications:  .  medroxyPROGESTERone (DEPO-PROVERA) injection 150 mg, 150 mg, Intramuscular, Q90 days, Denney, Rachelle A, CNM, 150 mg at 06/22/20 8546   No Known Allergies   Review of Systems  Constitutional: Negative for fatigue.  Respiratory: Negative for shortness of breath.   Cardiovascular: Negative for chest  pain.  Gastrointestinal: Negative for nausea.  Neurological: Negative for dizziness and headaches.  Psychiatric/Behavioral: Positive for depression. Negative for decreased concentration. The patient is nervous/anxious. The patient does not have insomnia.      Today's Vitals   08/20/20 1058  BP: 116/80  Pulse: 76  Temp: 98.4 F (36.9 C)  TempSrc: Oral  SpO2: 96%  Weight: 227 lb 12.8 oz (103.3 kg)  Height: $Remove'5\' 9"'ToBsZVf$  (1.753 m)  PainSc: 0-No pain   Body mass index is 33.64 kg/m.   Objective:  Physical Exam Constitutional:      General: She is not in acute distress.    Appearance: Normal appearance. She is obese.  Cardiovascular:     Rate and Rhythm: Normal rate and regular rhythm.  Neurological:     General: No focal deficit present.     Mental Status: She is alert and oriented to person, place, and time.     Cranial Nerves: No cranial nerve deficit.  Psychiatric:        Mood and Affect: Mood normal.        Behavior: Behavior normal.        Thought Content: Thought content normal.        Judgment: Judgment normal.         Assessment And Plan:     1. Encounter to establish care with new doctor   2. Moderate episode of recurrent major depressive disorder ([place])  I will start her on celexa as this is ongoing according to the patient  She has never been treated in the past   I also feel she will benefit from seeing a psychiatrist and counselor  Denies homicidal or suicidal ideations  She was tearful during her visit - citalopram (CELEXA) 10 MG tablet; Take 1 tablet (10 mg total) by mouth daily.  Dispense: 30 tablet; Refill: 2  3. Anxiety  Will try celexa and encouraged to take magnesium nightly  I will also refer to psychiatry and counseling  This may be enhanced due to more stressors at home with now caring for her nephew who is 61 years old with special needs. - citalopram (CELEXA) 10 MG tablet; Take 1 tablet (10 mg total) by mouth daily.  Dispense: 30 tablet;  Refill: 2 - CMP14+EGFR - TSH - Ambulatory referral to Psychiatry - Magnesium 250 MG TABS; Take 1 tablet (250 mg total) by mouth every evening.  Dispense: 90 tablet; Refill: 1  4. History of seizures   She has never been treated with any medications  She also has not had any seizures in several years.   She was treated at [facility]  5. Alcohol Abuse  She scored 17 on her alcohol screening tool  I will make a referral to [facility] to see about getting treatment  I have encouraged her to not just discontinue drinking alcohol as this  may lead to seizures or other detox symptoms. - CBC - CMP14+EGFR - TSH - Lipid panel  6. Encounter for screening for metabolic disorder  - CBC - CMP14+EGFR - TSH - Lipid panel  7. History of DVT (deep vein thrombosis)  Seen this in her old notes   Patient was given opportunity to ask questions. Patient verbalized understanding of the plan and was able to repeat key elements of the plan. All questions were answered to their satisfaction.   Teola Bradley, FNP, have reviewed all documentation for this visit. The documentation on 08/22/20 for the exam, diagnosis, procedures, and orders are all accurate and complete.   THE PATIENT IS ENCOURAGED TO PRACTICE SOCIAL DISTANCING DUE TO THE COVID-19 PANDEMIC.

## 2020-08-20 NOTE — Patient Instructions (Signed)
Melatonin 3mg  nightly to help you sleep.

## 2020-08-21 LAB — CMP14+EGFR
ALT: 12 IU/L (ref 0–32)
AST: 15 IU/L (ref 0–40)
Albumin/Globulin Ratio: 1.4 (ref 1.2–2.2)
Albumin: 4.2 g/dL (ref 3.9–5.0)
Alkaline Phosphatase: 66 IU/L (ref 44–121)
BUN/Creatinine Ratio: 10 (ref 9–23)
BUN: 7 mg/dL (ref 6–20)
Bilirubin Total: 0.5 mg/dL (ref 0.0–1.2)
CO2: 21 mmol/L (ref 20–29)
Calcium: 9.4 mg/dL (ref 8.7–10.2)
Chloride: 106 mmol/L (ref 96–106)
Creatinine, Ser: 0.69 mg/dL (ref 0.57–1.00)
GFR calc Af Amer: 136 mL/min/{1.73_m2} (ref >59)
GFR calc non Af Amer: 118 mL/min/{1.73_m2} (ref >59)
Globulin, Total: 3.1 g/dL (ref 1.5–4.5)
Glucose: 101 mg/dL — ABNORMAL HIGH (ref 65–99)
Potassium: 4.3 mmol/L (ref 3.5–5.2)
Sodium: 140 mmol/L (ref 134–144)
Total Protein: 7.3 g/dL (ref 6.0–8.5)

## 2020-08-21 LAB — LIPID PANEL
Chol/HDL Ratio: 3.1 ratio (ref 0.0–4.4)
Cholesterol, Total: 145 mg/dL (ref 100–199)
HDL: 47 mg/dL (ref >39)
LDL Chol Calc (NIH): 83 mg/dL (ref 0–99)
Triglycerides: 74 mg/dL (ref 0–149)
VLDL Cholesterol Cal: 15 mg/dL (ref 5–40)

## 2020-08-21 LAB — CBC
Hematocrit: 44.2 % (ref 34.0–46.6)
Hemoglobin: 13.5 g/dL (ref 11.1–15.9)
MCH: 23.4 pg — ABNORMAL LOW (ref 26.6–33.0)
MCHC: 30.5 g/dL — ABNORMAL LOW (ref 31.5–35.7)
MCV: 77 fL — ABNORMAL LOW (ref 79–97)
Platelets: 222 10*3/uL (ref 150–450)
RBC: 5.78 x10E6/uL — ABNORMAL HIGH (ref 3.77–5.28)
RDW: 14.1 % (ref 11.7–15.4)
WBC: 6.2 10*3/uL (ref 3.4–10.8)

## 2020-08-21 LAB — TSH: TSH: 2.24 u[IU]/mL (ref 0.450–4.500)

## 2020-08-24 LAB — SPECIMEN STATUS REPORT

## 2020-08-24 LAB — GAMMA GT: GGT: 11 IU/L (ref 0–60)

## 2020-09-12 ENCOUNTER — Ambulatory Visit (INDEPENDENT_AMBULATORY_CARE_PROVIDER_SITE_OTHER): Payer: Medicaid Other

## 2020-09-12 ENCOUNTER — Other Ambulatory Visit: Payer: Self-pay

## 2020-09-12 VITALS — BP 121/74 | HR 78

## 2020-09-12 DIAGNOSIS — Z3042 Encounter for surveillance of injectable contraceptive: Secondary | ICD-10-CM

## 2020-09-12 MED ORDER — MEDROXYPROGESTERONE ACETATE 150 MG/ML IM SUSP
150.0000 mg | Freq: Once | INTRAMUSCULAR | Status: AC
  Administered 2020-09-12: 150 mg via INTRAMUSCULAR

## 2020-09-12 NOTE — Progress Notes (Signed)
Gyn patient in the office today for depo-provera injection,administered in R deltoid muscle. Patient tolerated well and will follow up in three months.

## 2020-09-17 ENCOUNTER — Ambulatory Visit: Payer: 59 | Admitting: Nurse Practitioner

## 2020-10-29 ENCOUNTER — Other Ambulatory Visit: Payer: Medicaid Other

## 2020-11-27 ENCOUNTER — Encounter: Payer: Self-pay | Admitting: Nurse Practitioner

## 2020-11-29 ENCOUNTER — Ambulatory Visit: Payer: Self-pay | Admitting: Nurse Practitioner

## 2020-12-03 ENCOUNTER — Other Ambulatory Visit: Payer: Self-pay | Admitting: Obstetrics & Gynecology

## 2020-12-03 DIAGNOSIS — Z3042 Encounter for surveillance of injectable contraceptive: Secondary | ICD-10-CM

## 2020-12-05 ENCOUNTER — Ambulatory Visit (INDEPENDENT_AMBULATORY_CARE_PROVIDER_SITE_OTHER): Payer: Medicaid Other

## 2020-12-05 ENCOUNTER — Other Ambulatory Visit: Payer: Self-pay

## 2020-12-05 VITALS — BP 116/80 | HR 84 | Ht 69.0 in | Wt 226.4 lb

## 2020-12-05 DIAGNOSIS — Z3042 Encounter for surveillance of injectable contraceptive: Secondary | ICD-10-CM

## 2020-12-05 MED ORDER — MEDROXYPROGESTERONE ACETATE 150 MG/ML IM SUSP: 150.0000 mg | INTRAMUSCULAR | 3 refills | Status: DC

## 2020-12-05 NOTE — Progress Notes (Signed)
Pt presents today for depo provera. Depo Provera 150mg  given IM L deltoid. Pt tolerated well with no adverse side effects noted. Pt is due for yearly exam. She also states she needs RF on her Depo. Will send RX to pharmacy. Next injection due May 4-May 8.

## 2021-01-01 ENCOUNTER — Ambulatory Visit: Payer: Medicaid Other | Admitting: Obstetrics and Gynecology

## 2021-01-15 ENCOUNTER — Other Ambulatory Visit (HOSPITAL_COMMUNITY)
Admission: RE | Admit: 2021-01-15 | Discharge: 2021-01-15 | Disposition: A | Discharge status: Home / Self Care | Payer: Medicaid Other | Source: Ambulatory Visit | Attending: Obstetrics & Gynecology | Admitting: Obstetrics & Gynecology

## 2021-01-15 ENCOUNTER — Other Ambulatory Visit: Payer: Self-pay

## 2021-01-15 ENCOUNTER — Encounter: Payer: Self-pay | Admitting: Obstetrics & Gynecology

## 2021-01-15 ENCOUNTER — Ambulatory Visit (INDEPENDENT_AMBULATORY_CARE_PROVIDER_SITE_OTHER): Payer: Medicaid Other | Admitting: Obstetrics & Gynecology

## 2021-01-15 VITALS — BP 126/81 | HR 79 | Ht 69.0 in | Wt 231.0 lb

## 2021-01-15 DIAGNOSIS — Z01419 Encounter for gynecological examination (general) (routine) without abnormal findings: Secondary | ICD-10-CM

## 2021-01-15 DIAGNOSIS — B9689 Other specified bacterial agents as the cause of diseases classified elsewhere: Secondary | ICD-10-CM | POA: Insufficient documentation

## 2021-01-15 DIAGNOSIS — N760 Acute vaginitis: Secondary | ICD-10-CM | POA: Insufficient documentation

## 2021-01-15 DIAGNOSIS — Z3042 Encounter for surveillance of injectable contraceptive: Secondary | ICD-10-CM | POA: Diagnosis not present

## 2021-01-15 DIAGNOSIS — Z113 Encounter for screening for infections with a predominantly sexual mode of transmission: Secondary | ICD-10-CM | POA: Insufficient documentation

## 2021-01-15 NOTE — Patient Instructions (Signed)
Medroxyprogesterone injection [Contraceptive] What is this medicine? MEDROXYPROGESTERONE (me DROX ee proe JES te rone) contraceptive injections prevent pregnancy. They provide effective birth control for 3 months. Depo-SubQ Provera 104 injection is also used for treating pain related to endometriosis. This medicine may be used for other purposes; ask your health care provider or pharmacist if you have questions. COMMON BRAND NAME(S): Depo-Provera, Depo-subQ Provera 104 What should I tell my health care provider before I take this medicine? They need to know if you have any of these conditions:  asthma  blood clots  breast cancer or family history of breast cancer  depression  diabetes  eating disorder (anorexia nervosa)  heart attack  high blood pressure  HIV infection or AIDS  if you often drink alcohol  kidney disease  liver disease  migraine headaches  osteoporosis, weak bones  seizures  stroke  tobacco smoker  vaginal bleeding  an unusual or allergic reaction to medroxyprogesterone, other hormones, medicines, foods, dyes, or preservatives  pregnant or trying to get pregnant  breast-feeding How should I use this medicine? Depo-Provera CI contraceptive injection is given into a muscle. Depo-subQ Provera 104 injection is given under the skin. It is given by a health care provider in a hospital or clinic setting. The injection is usually given during the first 5 days after the start of a menstrual period or 6 weeks after delivery of a baby. A patient package insert for the product will be given with each prescription and refill. Be sure to read this information carefully each time. The sheet may change often. Talk to your pediatrician regarding the use of this medicine in children. Special care may be needed. These injections have been used in female children who have started having menstrual periods. Overdosage: If you think you have taken too much of this  medicine contact a poison control center or emergency room at once. NOTE: This medicine is only for you. Do not share this medicine with others. What if I miss a dose? Keep appointments for follow-up doses. You must get an injection once every 3 months. It is important not to miss your dose. Call your health care provider if you are unable to keep an appointment. What may interact with this medicine?  antibiotics or medicines for infections, especially rifampin and griseofulvin  antivirals for HIV or hepatitis  aprepitant  armodafinil  bexarotene  bosentan  medicines for seizures like carbamazepine, felbamate, oxcarbazepine, phenytoin, phenobarbital, primidone, topiramate  mitotane  modafinil  St. John's wort This list may not describe all possible interactions. Give your health care provider a list of all the medicines, herbs, non-prescription drugs, or dietary supplements you use. Also tell them if you smoke, drink alcohol, or use illegal drugs. Some items may interact with your medicine. What should I watch for while using this medicine? This drug does not protect you against HIV infection (AIDS) or other sexually transmitted diseases. Use of this product may cause you to lose calcium from your bones. Loss of calcium may cause weak bones (osteoporosis). Only use this product for more than 2 years if other forms of birth control are not right for you. The longer you use this product for birth control the more likely you will be at risk for weak bones. Ask your health care professional how you can keep strong bones. You may have a change in bleeding pattern or irregular periods. Many females stop having periods while taking this drug. If you have received your injections on time, your   chance of being pregnant is very low. If you think you may be pregnant, see your health care professional as soon as possible. Tell your health care professional if you want to get pregnant within the  next year. The effect of this medicine may last a long time after you get your last injection. What side effects may I notice from receiving this medicine? Side effects that you should report to your doctor or health care professional as soon as possible:  allergic reactions like skin rash, itching or hives, swelling of the face, lips, or tongue  blood clot (chest pain; shortness of breath; pain, swelling, or warmth in the leg)  breast tenderness or discharge  changes in emotions or moods  changes in vision  liver injury (dark yellow or brown urine; general ill feeling or flu-like symptoms; loss of appetite, right upper belly pain; unusually weak or tired, yellowing of the eyes or skin)  persistent pain, pus, or bleeding at the injection site  stroke (changes in vision; confusion; trouble speaking or understanding; severe headaches; sudden numbness or weakness of the face, arm or leg; trouble walking; dizziness; loss of balance or coordination)  trouble breathing Side effects that usually do not require medical attention (report to your doctor or health care professional if they continue or are bothersome):  change in sex drive  dizziness  fluid retention  headache  irregular periods, spotting, or absent periods  pain, redness, or irritation at site where injected  stomach pain  weight gain This list may not describe all possible side effects. Call your doctor for medical advice about side effects. You may report side effects to FDA at 1-800-FDA-1088. Where should I keep my medicine? This injection is only given by a health care provider. It will not be stored at home. NOTE: This sheet is a summary. It may not cover all possible information. If you have questions about this medicine, talk to your doctor, pharmacist, or health care provider.  2021 Elsevier/Gold Standard (2019-11-23 10:29:21)  

## 2021-01-15 NOTE — Progress Notes (Signed)
Patient ID: [name], female   DOB: January 21, 1991, 30 y.o.   MRN: [id number]  Chief Complaint  Patient presents with  . Gynecologic Exam    HPI [name] is a 30 y.o. female.  [id number] No LMP recorded (lmp unknown). Patient has had an injection. She will have DMPA in a few weeks. She requests STD screening today. Doing well with only occasional light bleeding HPI  Past Medical History:  Diagnosis Date  . Convulsion, non-epileptic ([place])   . Seizures ([place])   . Seizures ([place])    "converted seizures", treated at [facility]  . Vaginal Pap smear, abnormal     Past Surgical History:  Procedure Laterality Date  . MASS EXCISION N/A 09/13/2013   Procedure: EXCISION OF SOFT TISSUE MASS ON BACK;  Surgeon: Irene Limbo, MD;  Location: [facility];  Service: Plastics;  Laterality: N/A;  . none    . WISDOM TOOTH EXTRACTION      No family history on file.  Social History Social History   Tobacco Use  . Smoking status: Never Smoker  . Smokeless tobacco: Never Used  Vaping Use  . Vaping Use: Never used  Substance Use Topics  . Alcohol use: Yes    Alcohol/week: 4.0 standard drinks    Types: 4 Shots of liquor per week  . Drug use: No    No Known Allergies  Current Outpatient Medications  Medication Sig Dispense Refill  . medroxyPROGESTERone (DEPO-PROVERA) 150 MG/ML injection Inject 1 mL (150 mg total) into the muscle every 3 (three) months. 1 mL 3   Current Facility-Administered Medications  Medication Dose Route Frequency Provider Last Rate Last Admin  . medroxyPROGESTERone (DEPO-PROVERA) injection 150 mg  150 mg Intramuscular Q90 days Denney, Rachelle A, CNM   150 mg at 12/05/20 1329    Review of Systems Review of Systems  Constitutional: Negative.   HENT: Negative.   Respiratory: Negative.   Cardiovascular: Negative.   Gastrointestinal: Negative.   Genitourinary: Negative.   Psychiatric/Behavioral: Negative for confusion.    Blood pressure  126/81, pulse 79, height 5\' 9"  (1.753 m), weight 231 lb (104.8 kg).  Physical Exam Physical Exam Vitals and nursing note reviewed. Exam conducted with a chaperone present.  Constitutional:      Appearance: Normal appearance. She is obese.  Cardiovascular:     Rate and Rhythm: Normal rate.  Pulmonary:     Effort: Pulmonary effort is normal.  Chest:  Breasts:     Right: Normal. No axillary adenopathy.     Left: Normal. No axillary adenopathy.    Abdominal:     Palpations: Abdomen is soft.     Tenderness: There is no abdominal tenderness.  Genitourinary:    General: Normal vulva.     Exam position: Lithotomy position.     Vagina: Normal.     Cervix: Normal.     Uterus: Normal.      Adnexa: Right adnexa normal and left adnexa normal.     Rectum: Normal.  Lymphadenopathy:     Upper Body:     Right upper body: No axillary adenopathy.     Left upper body: No axillary adenopathy.  Skin:    General: Skin is warm and dry.  Neurological:     Mental Status: She is alert.  Psychiatric:        Mood and Affect: Mood normal.        Behavior: Behavior normal.     Data Reviewed Pap 2018  Assessment Well  woman exam with routine gynecological exam - Plan: Cytology - PAP, Cervicovaginal ancillary only( [place]), RPR+HBsAg+HCVAb+..., HIV antibody (with reflex)  Surveillance for medroxyprogesterone contraception doing well    Plan: RTC for next DMPA as scheduled Annual exams  Discussed BSE      Emeterio Reeve 01/15/2021, 10:19 AM

## 2021-01-15 NOTE — Progress Notes (Signed)
Pt presents today for yearly exam. Pt is currently on depo for [facility] and does not have a menstrual cycle with this BC. Pt has no complaints today.

## 2021-01-16 LAB — RPR+HBSAG+HCVAB+...
HIV Screen 4th Generation wRfx: NONREACTIVE
Hep C Virus Ab: <0.1 s/co ratio (ref 0.0–0.9)
Hepatitis B Surface Ag: NEGATIVE
RPR Ser Ql: NONREACTIVE

## 2021-01-16 LAB — CERVICOVAGINAL ANCILLARY ONLY
Bacterial Vaginitis (gardnerella): POSITIVE — AB
Candida Glabrata: NEGATIVE
Candida Vaginitis: NEGATIVE
Chlamydia: NEGATIVE
Comment: NEGATIVE
Comment: NEGATIVE
Comment: NEGATIVE
Comment: NEGATIVE
Comment: NEGATIVE
Comment: NORMAL
Neisseria Gonorrhea: NEGATIVE
Trichomonas: NEGATIVE

## 2021-01-17 LAB — CYTOLOGY - PAP
Comment: NEGATIVE
Diagnosis: NEGATIVE
High risk HPV: NEGATIVE

## 2021-01-18 ENCOUNTER — Other Ambulatory Visit: Payer: Self-pay | Admitting: Obstetrics & Gynecology

## 2021-01-18 DIAGNOSIS — Z01419 Encounter for gynecological examination (general) (routine) without abnormal findings: Secondary | ICD-10-CM

## 2021-01-18 MED ORDER — METRONIDAZOLE 500 MG PO TABS
500.0000 mg | ORAL_TABLET | Freq: Two times a day (BID) | ORAL | 0 refills | Status: DC
Start: 2021-01-18 — End: 2021-07-25

## 2021-01-18 NOTE — Progress Notes (Signed)
Pos BV  Meds ordered this encounter  Medications  . metroNIDAZOLE (FLAGYL) 500 MG tablet    Sig: Take 1 tablet (500 mg total) by mouth 2 (two) times daily.    Dispense:  14 tablet    Refill:  0

## 2021-01-24 DIAGNOSIS — Z20822 Contact with and (suspected) exposure to covid-19: Secondary | ICD-10-CM | POA: Diagnosis not present

## 2021-01-24 DIAGNOSIS — K529 Noninfective gastroenteritis and colitis, unspecified: Secondary | ICD-10-CM | POA: Diagnosis not present

## 2021-01-24 DIAGNOSIS — H65191 Other acute nonsuppurative otitis media, right ear: Secondary | ICD-10-CM | POA: Diagnosis not present

## 2021-01-24 DIAGNOSIS — R112 Nausea with vomiting, unspecified: Secondary | ICD-10-CM | POA: Diagnosis not present

## 2021-02-26 ENCOUNTER — Ambulatory Visit (INDEPENDENT_AMBULATORY_CARE_PROVIDER_SITE_OTHER): Payer: Medicaid Other

## 2021-02-26 ENCOUNTER — Other Ambulatory Visit: Payer: Self-pay

## 2021-02-26 DIAGNOSIS — Z3042 Encounter for surveillance of injectable contraceptive: Secondary | ICD-10-CM | POA: Diagnosis not present

## 2021-02-26 MED ORDER — MEDROXYPROGESTERONE ACETATE 150 MG/ML IM SUSP
150.0000 mg | Freq: Once | INTRAMUSCULAR | Status: AC
  Administered 2021-02-26: 150 mg via INTRAMUSCULAR

## 2021-02-26 NOTE — Progress Notes (Signed)
Patient presents for depo inj. Patient is within her window. Inj given in left deltoid. Patient tolerated well. Next depo due 7/26-8/9.

## 2021-05-16 ENCOUNTER — Ambulatory Visit (INDEPENDENT_AMBULATORY_CARE_PROVIDER_SITE_OTHER): Payer: No Typology Code available for payment source

## 2021-05-16 ENCOUNTER — Other Ambulatory Visit: Payer: Self-pay

## 2021-05-16 VITALS — BP 114/74 | HR 78 | Wt 232.0 lb

## 2021-05-16 DIAGNOSIS — Z3042 Encounter for surveillance of injectable contraceptive: Secondary | ICD-10-CM | POA: Diagnosis not present

## 2021-05-16 MED ORDER — MEDROXYPROGESTERONE ACETATE 150 MG/ML IM SUSP
150.0000 mg | Freq: Once | INTRAMUSCULAR | Status: AC
  Administered 2021-05-16: 150 mg via INTRAMUSCULAR

## 2021-05-16 NOTE — Progress Notes (Signed)
SUBJECTIVE: [name] is a 30 y.o. female who presents for DEPO Injection.  OBJECTIVE: Appears well, in no apparent distress.  Vital signs are normal.    ASSESSMENT: within window for DEPO. Last PAP 01/15/2021  PLAN: DEPO given in RD, tolerated well. Next DEPO due Oct. 13-27, 2022  Administrations This Visit     medroxyPROGESTERone (DEPO-PROVERA) injection 150 mg     Admin Date 05/16/2021 Action Given Dose 150 mg Route Intramuscular Administered By Tamela Oddi, RMA

## 2021-06-24 ENCOUNTER — Emergency Department (HOSPITAL_COMMUNITY): Payer: PRIVATE HEALTH INSURANCE

## 2021-06-24 ENCOUNTER — Emergency Department (HOSPITAL_COMMUNITY)
Admission: EM | Admit: 2021-06-24 | Discharge: 2021-06-24 | Disposition: A | Discharge status: Home / Self Care | Payer: PRIVATE HEALTH INSURANCE | Attending: Emergency Medicine | Admitting: Emergency Medicine

## 2021-06-24 ENCOUNTER — Encounter (HOSPITAL_COMMUNITY): Payer: Self-pay

## 2021-06-24 DIAGNOSIS — R42 Dizziness and giddiness: Secondary | ICD-10-CM | POA: Insufficient documentation

## 2021-06-24 DIAGNOSIS — R519 Headache, unspecified: Secondary | ICD-10-CM | POA: Insufficient documentation

## 2021-06-24 DIAGNOSIS — I10 Essential (primary) hypertension: Secondary | ICD-10-CM | POA: Diagnosis not present

## 2021-06-24 DIAGNOSIS — R5383 Other fatigue: Secondary | ICD-10-CM | POA: Diagnosis not present

## 2021-06-24 DIAGNOSIS — Z20822 Contact with and (suspected) exposure to covid-19: Secondary | ICD-10-CM | POA: Insufficient documentation

## 2021-06-24 DIAGNOSIS — R079 Chest pain, unspecified: Secondary | ICD-10-CM | POA: Diagnosis not present

## 2021-06-24 LAB — CBC WITH DIFFERENTIAL/PLATELET
Abs Immature Granulocytes: 0.02 10*3/uL (ref 0.00–0.07)
Basophils Absolute: 0 10*3/uL (ref 0.0–0.1)
Basophils Relative: 1 %
Eosinophils Absolute: 0.2 10*3/uL (ref 0.0–0.5)
Eosinophils Relative: 3 %
HCT: 41.3 % (ref 36.0–46.0)
Hemoglobin: 13.2 g/dL (ref 12.0–15.0)
Immature Granulocytes: 0 %
Lymphocytes Relative: 31 %
Lymphs Abs: 2.1 10*3/uL (ref 0.7–4.0)
MCH: 24 pg — ABNORMAL LOW (ref 26.0–34.0)
MCHC: 32 g/dL (ref 30.0–36.0)
MCV: 75 fL — ABNORMAL LOW (ref 80.0–100.0)
Monocytes Absolute: 0.5 10*3/uL (ref 0.1–1.0)
Monocytes Relative: 8 %
Neutro Abs: 3.8 10*3/uL (ref 1.7–7.7)
Neutrophils Relative %: 57 %
Platelets: 236 10*3/uL (ref 150–400)
RBC: 5.51 MIL/uL — ABNORMAL HIGH (ref 3.87–5.11)
RDW: 14.4 % (ref 11.5–15.5)
WBC: 6.6 10*3/uL (ref 4.0–10.5)
nRBC: 0 % (ref 0.0–0.2)

## 2021-06-24 LAB — COMPREHENSIVE METABOLIC PANEL
ALT: 16 U/L (ref 0–44)
AST: 16 U/L (ref 15–41)
Albumin: 3.5 g/dL (ref 3.5–5.0)
Alkaline Phosphatase: 52 U/L (ref 38–126)
Anion gap: 5 (ref 5–15)
BUN: 9 mg/dL (ref 6–20)
CO2: 23 mmol/L (ref 22–32)
Calcium: 9 mg/dL (ref 8.9–10.3)
Chloride: 109 mmol/L (ref 98–111)
Creatinine, Ser: 0.78 mg/dL (ref 0.44–1.00)
GFR, Estimated: >60 mL/min (ref >60)
Glucose, Bld: 101 mg/dL — ABNORMAL HIGH (ref 70–99)
Potassium: 3.7 mmol/L (ref 3.5–5.1)
Sodium: 137 mmol/L (ref 135–145)
Total Bilirubin: 0.6 mg/dL (ref 0.3–1.2)
Total Protein: 6.6 g/dL (ref 6.5–8.1)

## 2021-06-24 LAB — I-STAT BETA HCG BLOOD, ED (MC, WL, AP ONLY): I-stat hCG, quantitative: <5 m[IU]/mL (ref <5)

## 2021-06-24 LAB — RESP PANEL BY RT-PCR (FLU A&B, COVID) ARPGX2
Influenza A by PCR: NEGATIVE
Influenza B by PCR: NEGATIVE
SARS Coronavirus 2 by RT PCR: NEGATIVE

## 2021-06-24 NOTE — Discharge Instructions (Addendum)
During plenty of fluids.  Your evaluation today in the ED was reassuring.  No signs of pneumonia.  Your COVID test was normal.  Your blood test do not show signs of severe dehydration or anemia.Follow-up with your doctor to be rechecked if symptoms persist.

## 2021-06-24 NOTE — ED Triage Notes (Signed)
Pt c/o HTN (141/90, advised to be seen if symptomatic), HA, dizziness x1wk, was advised by family to take vinegar & water which helped. Martin Majestic out of town for vacation but slept whole time. Denies sick contact, vaccinated but unboosted. No hx  Denies pain, "just tired."

## 2021-06-24 NOTE — ED Provider Notes (Signed)
[place] EMERGENCY DEPARTMENT Provider Note   CSN: [id number] Arrival date & time: 06/24/21  1619     History Chief Complaint  Patient presents with   Headache   Dizziness   Hypertension    [name] is a 30 y.o. female.   Headache Associated symptoms: dizziness   Dizziness Associated symptoms: headaches   Hypertension Associated symptoms include headaches.    Pt states she has been having some issues with lightheadedness and fatigue.  She notices symptoms mostly when she goes to stand up.  These have been ongoing since Thursday.  Patient was visiting family and at one point they took her blood pressure.  It was elevated in the 0000000 systolic.  They recommended she get that checked out when she returned home.  Symptoms persisted throughout the weekend and the patient's pulse goes back to work tomorrow so she wanted to make sure she was okay.  No trouble with speech or strength.  Walking without difficulty.  No fevers or chills.  NO vomiting or diarrhea.  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[place])    Seizures ([place])    Seizures ([place])    "converted seizures", treated at [facility]   Vaginal Pap smear, abnormal     Patient Active Problem List   Diagnosis Date Noted   Well woman exam with routine gynecological exam 10/06/2018   Exposure to STD 10/06/2018   Surveillance for medroxyprogesterone contraception 10/06/2018   Seizures ([place]) 06/15/2011    Past Surgical History:  Procedure Laterality Date   MASS EXCISION N/A 09/13/2013   Procedure: EXCISION OF SOFT TISSUE MASS ON BACK;  Surgeon: Irene Limbo, MD;  Location: ;  Service: Plastics;  Laterality: N/A;   none     WISDOM TOOTH EXTRACTION       OB History     Gravida  2   Para  2   Term  2   Preterm  0   AB  0   Living  2      SAB  0   IAB  0   Ectopic  0   Multiple  0   Live Births  2            No family history on file.  Social History   Tobacco Use   Smoking status: Never   Smokeless tobacco: Never  Vaping Use   Vaping Use: Never used  Substance Use Topics   Alcohol use: Yes    Alcohol/week: 4.0 standard drinks    Types: 4 Shots of liquor per week   Drug use: No    Home Medications Prior to Admission medications   Medication Sig Start Date End Date Taking? Authorizing Provider  medroxyPROGESTERone (DEPO-PROVERA) 150 MG/ML injection Inject 1 mL (150 mg total) into the muscle every 3 (three) months. 12/05/20   Chancy Milroy, MD  metroNIDAZOLE (FLAGYL) 500 MG tablet Take 1 tablet (500 mg total) by mouth 2 (two) times daily. 01/18/21   Woodroe Mode, MD    Allergies    Patient has no known allergies.  Review of Systems   Review of Systems  Neurological:  Positive for dizziness and headaches.  All other systems reviewed and are negative.  Physical Exam Updated Vital Signs BP (!) 148/97 (BP Location: Right Arm)   Pulse (!) 102   Temp 98.3 F (36.8 C) (Oral)   Resp 18   SpO2  100%   Physical Exam Vitals and nursing note reviewed.  Constitutional:      General: She is not in acute distress.    Appearance: She is well-developed.  HENT:     Head: Normocephalic and atraumatic.     Right Ear: External ear normal.     Left Ear: External ear normal.  Eyes:     General: No scleral icterus.       Right eye: No discharge.        Left eye: No discharge.     Conjunctiva/sclera: Conjunctivae normal.  Neck:     Trachea: No tracheal deviation.  Cardiovascular:     Rate and Rhythm: Normal rate and regular rhythm.  Pulmonary:     Effort: Pulmonary effort is normal. No respiratory distress.     Breath sounds: Normal breath sounds. No stridor. No wheezing or rales.  Abdominal:     General: Bowel sounds are normal. There is no distension.     Palpations: Abdomen is soft.     Tenderness: There is no abdominal tenderness. There is no guarding or rebound.   Musculoskeletal:        General: No tenderness or deformity.     Cervical back: Neck supple.  Skin:    General: Skin is warm and dry.     Findings: No rash.  Neurological:     General: No focal deficit present.     Mental Status: She is alert.     Cranial Nerves: No cranial nerve deficit (no facial droop, extraocular movements intact, no slurred speech).     Sensory: No sensory deficit.     Motor: No abnormal muscle tone or seizure activity.     Coordination: Coordination normal.  Psychiatric:        Mood and Affect: Mood normal.    ED Results / Procedures / Treatments   Labs (all labs ordered are listed, but only abnormal results are displayed) Labs Reviewed  COMPREHENSIVE METABOLIC PANEL - Abnormal; Notable for the following components:      Result Value   Glucose, Bld 101 (*)    All other components within normal limits  CBC WITH DIFFERENTIAL/PLATELET - Abnormal; Notable for the following components:   RBC 5.51 (*)    MCV 75.0 (*)    MCH 24.0 (*)    All other components within normal limits  RESP PANEL BY RT-PCR (FLU A&B, COVID) ARPGX2  I-STAT BETA HCG BLOOD, ED ([facility], [facility], [facility] ONLY)    EKG EKG Interpretation  Date/Time:  Monday June 24 2021 17:12:23 EDT Ventricular Rate:  91 PR Interval:  158 QRS Duration: 76 QT Interval:  348 QTC Calculation: 428 R Axis:   67 Text Interpretation: Normal sinus rhythm Normal ECG Since last tracing rate slower Confirmed by Dorie Rank [phone]) on 06/24/2021 6:32:55 PM  Radiology DG Chest 2 View  Result Date: 06/24/2021 CLINICAL DATA:  Upper chest pain. EXAM: CHEST - 2 VIEW COMPARISON:  CT angiogram chest 01/05/2017. FINDINGS: The heart size and mediastinal contours are within normal limits. Both lungs are clear. The visualized skeletal structures are unremarkable. Piercing overlies the region of the mandible. IMPRESSION: No active cardiopulmonary disease. Electronically Signed   By: Ronney Asters M.D.   On: 06/24/2021 17:51     Procedures Procedures   Medications Ordered in ED Medications - No data to display  ED Course  I have reviewed the triage vital signs and the nursing notes.  Pertinent labs & imaging results that were available during  my care of the patient were reviewed by me and considered in my medical decision making (see chart for details).    MDM Rules/Calculators/A&P                           ED work-up reassuring.  Vital signs are notable for mild hypertension but at the bedside her blood pressure was down into the 123456 systolic on my exam.  She is not anemic.  No signs of dehydration.  COVID and flu are negative.  No pneumonia on x-ray.  EKG shows normal heart rhythm.  Patient is relatively asymptomatic at this time.  Unclear the etiology of her symptoms but no signs of any emergency medical condition.  She may be having some issues with mild dehydration earlier versus mild viral illness.  Appears stable for discharge with close follow-up with her primary care doctor. Final Clinical Impression(s) / ED Diagnoses Final diagnoses:  Dizziness    Rx / DC Orders ED Discharge Orders     None        Dorie Rank, MD 06/24/21 1904

## 2021-06-24 NOTE — ED Provider Notes (Signed)
Emergency Medicine Provider Triage Evaluation Note  [name] , a 30 y.o. female  was evaluated in triage.  Pt complains of since Thursday feeling tired, she reports that she feels sore in her upper body.  Head hurts.  No N/V/D.  No abdominal pain.  No cough.   Review of Systems  Positive: Soreness, fatigue, headache Negative: fevers  Physical Exam  BP (!) 148/97 (BP Location: Right Arm)   Pulse (!) 102   Temp 98.3 F (36.8 C) (Oral)   Resp 18   SpO2 100%  Gen:   Awake, no distress   Resp:  Normal effort  MSK:   Moves extremities without difficulty  Other:  Awake and alert, speech is not slurred.   Medical Decision Making  Medically screening exam initiated at 4:54 PM.  Appropriate orders placed.  [name] was informed that the remainder of the evaluation will be completed by another provider, this initial triage assessment does not replace that evaluation, and the importance of remaining in the ED until their evaluation is complete.  Note: Portions of this report may have been transcribed using voice recognition software. Every effort was made to ensure accuracy; however, inadvertent computerized transcription errors may be present  Patient does not normally check her blood pressure, only checked it after she was not feeling well.  I suspect that she has a little bit of high blood pressure from not feeling well in the situation rather than it being the driving cause of her symptoms.  Will obtain basic labs, she does have some  Chest discomforts will obtain chest x-ray and EKG also, however low suspicion for cardiac cause.   Lorin Glass, PA-C 06/24/21 1703    Dorie Rank, MD 06/25/21 2122

## 2021-06-25 ENCOUNTER — Telehealth: Payer: Self-pay

## 2021-06-25 NOTE — Telephone Encounter (Signed)
Transition Care Management Unsuccessful Follow-up Telephone Call  Date of discharge and from where:  06/24/2021-[facility]   Attempts:  1st Attempt  Reason for unsuccessful TCM follow-up call:  Unable to reach patient

## 2021-06-26 ENCOUNTER — Ambulatory Visit (INDEPENDENT_AMBULATORY_CARE_PROVIDER_SITE_OTHER): Payer: No Typology Code available for payment source | Admitting: Nurse Practitioner

## 2021-06-26 ENCOUNTER — Encounter: Payer: Self-pay | Admitting: Nurse Practitioner

## 2021-06-26 ENCOUNTER — Other Ambulatory Visit: Payer: Self-pay

## 2021-06-26 VITALS — BP 122/100 | HR 86 | Temp 98.8°F | Ht 69.0 in | Wt 230.2 lb

## 2021-06-26 DIAGNOSIS — I10 Essential (primary) hypertension: Secondary | ICD-10-CM | POA: Diagnosis not present

## 2021-06-26 DIAGNOSIS — Z13228 Encounter for screening for other metabolic disorders: Secondary | ICD-10-CM

## 2021-06-26 MED ORDER — AMLODIPINE BESYLATE 5 MG PO TABS: 5.0000 mg | ORAL_TABLET | Freq: Every day | ORAL | 2 refills | Status: DC

## 2021-06-26 NOTE — Progress Notes (Signed)
[employer] as a Education administrator for [employer], NP.,have documented all relevant documentation on the behalf of [employer], NP,as directed by  Bary Castilla, NP while in the presence of Bary Castilla, NP.  This visit occurred during the SARS-CoV-2 public health emergency.  Safety protocols were in place, including screening questions prior to the visit, additional usage of staff PPE, and extensive cleaning of exam room while observing appropriate contact time as indicated for disinfecting solutions.  Subjective:     Patient ID: [name] , female    DOB: 09-03-1991 , 30 y.o.   MRN: [id number]   Chief Complaint  Patient presents with   Hypertension    HPI  Pt presents today for elevated BP concerns. 141/100 reading, before coming in today, taken at 10am this morning. She experiences blurred vision, dizziness, headache, she reports heavy feeling in her head. She happened to go to the ER last night, they had an EKG, blood work & Covid test done, all results came back normal. Pt was told to follow up with PCP. She rates headache today 7/10. 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   Current Outpatient Medications:    amLODipine (NORVASC) 5 MG tablet, Take 1 tablet (5 mg total) by mouth daily., Disp: 30 tablet, Rfl: 2   medroxyPROGESTERone (DEPO-PROVERA) 150 MG/ML injection, Inject 1 mL (150 mg total) into the muscle every 3 (three) months., Disp: 1 mL, Rfl: 3   metroNIDAZOLE (FLAGYL) 500 MG tablet, Take 1 tablet (500 mg total) by mouth 2 (two) times daily. (Patient not taking: Reported on 06/26/2021), Disp: 14 tablet, Rfl: 0  Current Facility-Administered Medications:    medroxyPROGESTERone (DEPO-PROVERA) injection 150 mg, 150  mg, Intramuscular, Q90 days, Denney, Rachelle A, CNM, 150 mg at 12/05/20 1329   No Known Allergies   Review of Systems  Constitutional: Negative.  Negative for chills and fever.  HENT:  Negative for congestion, sinus pressure and sinus pain.   Eyes:  Positive for visual disturbance.  Respiratory: Negative.  Negative for cough, shortness of breath and wheezing.   Cardiovascular: Negative.  Negative for chest pain and palpitations.  Musculoskeletal:  Negative for arthralgias and myalgias.  Neurological:  Positive for headaches. Negative for seizures and weakness.  Psychiatric/Behavioral: Negative.      Today's Vitals   06/26/21 1214  BP: (!) 122/100  Pulse: 86  Temp: 98.8 F (37.1 C)  Weight: 230 lb 3.2 oz (104.4 kg)  Height: '5\' 9"'$  (1.753 m)   Body mass index is 33.99 kg/m.  Wt Readings from Last 3 Encounters:  06/26/21 230 lb 3.2 oz (104.4 kg)  05/16/21 232 lb (105.2 kg)  01/15/21 231 lb (104.8 kg)    Objective:  Physical Exam Constitutional:      Appearance: Normal appearance.  HENT:     Head: Normocephalic and atraumatic.  Cardiovascular:     Rate and Rhythm: Normal rate and regular rhythm.     Pulses: Normal pulses.     Heart sounds: Normal heart sounds. No murmur heard. Pulmonary:     Effort: Pulmonary effort is normal. No respiratory distress.     Breath sounds: Normal breath sounds. No wheezing.  Skin:    General:  Skin is warm and dry.     Capillary Refill: Capillary refill takes less than 2 seconds.  Neurological:     Mental Status: She is alert.        Assessment And Plan:     1. Hypertension, unspecified type -Her BP Reading has been elevated at 141/100. She expresses that lately she has been experiencing some blurred vision, HA and dizziness.  -Will start her on BP meds  -Limit the intake of processed foods and salt intake. You should increase your intake of green vegetables and fruits. Limit the use of alcohol. Limit fast foods and fried foods. Avoid  high fatty saturated and trans fat foods. Keep yourself hydrated with drinking water. Avoid red meats. Eat lean meats instead. Exercise for atleast 30-45 min for atleast 4-5 times a week.  - amLODipine (NORVASC) 5 MG tablet; Take 1 tablet (5 mg total) by mouth daily.  Dispense: 30 tablet; Refill: 2  2. Encounter for screening for metabolic disorder - Hemoglobin A1c   Follow up: 1 month for BP  The patient was encouraged to call or send a message through [place] for any questions or concerns.   Side effects and appropriate use of all the medication(s) were discussed with the patient today. Patient advised to use the medication(s) as directed by their healthcare provider. The patient was encouraged to read, review, and understand all associated package inserts and contact our office with any questions or concerns. The patient accepts the risks of the treatment plan and had an opportunity to ask questions.   Patient was given opportunity to ask questions. Patient verbalized understanding of the plan and was able to repeat key elements of the plan. All questions were answered to their satisfaction.  Raman Eshawn Coor, DNP   I, Raman Anaily Ashbaugh have reviewed all documentation for this visit. The documentation on 02/28/21 for the exam, diagnosis, procedures, and orders are all accurate and complete.       IF YOU HAVE BEEN REFERRED TO A SPECIALIST, IT MAY TAKE 1-2 WEEKS TO SCHEDULE/PROCESS THE REFERRAL. IF YOU HAVE NOT HEARD FROM US/SPECIALIST IN TWO WEEKS, PLEASE GIVE [place] A CALL AT [phone].   THE PATIENT IS ENCOURAGED TO PRACTICE SOCIAL DISTANCING DUE TO THE COVID-19 PANDEMIC.

## 2021-06-26 NOTE — Telephone Encounter (Signed)
Transition Care Management Follow-up Telephone Call Date of discharge and from where: 06/24/2021-[facility] How have you been since you were released from the hospital? Patient stated she is doing fine. Any questions or concerns? No  Items Reviewed: Did the pt receive and understand the discharge instructions provided? Yes  Medications obtained and verified?  No medications given at discharge  Other? No  Any new allergies since your discharge? No  Dietary orders reviewed? N/A Do you have support at home? Yes   Home Care and Equipment/Supplies: Were home health services ordered? not applicable If so, what is the name of the agency? N/A  Has the agency set up a time to come to the patient's home? not applicable Were any new equipment or medical supplies ordered?  No What is the name of the medical supply agency? N/A Were you able to get the supplies/equipment? not applicable Do you have any questions related to the use of the equipment or supplies? No  Functional Questionnaire: (I = Independent and D = Dependent) ADLs: I  Bathing/Dressing- I  Meal Prep- I  Eating- I  Maintaining continence- I  Transferring/Ambulation- I  Managing Meds- I  Follow up appointments reviewed:  PCP Hospital f/u appt confirmed? Yes  Patient was seen today at Primary care office  [facility] f/u appt confirmed? No   Are transportation arrangements needed? No  If their condition worsens, is the pt aware to call PCP or go to the Emergency Dept.? Yes Was the patient provided with contact information for the PCP's office or ED? Yes Was to pt encouraged to call back with questions or concerns? Yes

## 2021-06-26 NOTE — Patient Instructions (Signed)

## 2021-06-27 ENCOUNTER — Ambulatory Visit: Payer: Medicaid Other | Admitting: Nurse Practitioner

## 2021-06-27 LAB — HEMOGLOBIN A1C
Est. average glucose Bld gHb Est-mCnc: 123 mg/dL
Hgb A1c MFr Bld: 5.9 % — ABNORMAL HIGH (ref 4.8–5.6)

## 2021-06-28 ENCOUNTER — Other Ambulatory Visit: Payer: Self-pay | Admitting: Nurse Practitioner

## 2021-06-28 DIAGNOSIS — R7303 Prediabetes: Secondary | ICD-10-CM

## 2021-06-28 MED ORDER — METFORMIN HCL 500 MG PO TABS: 500.0000 mg | ORAL_TABLET | Freq: Two times a day (BID) | ORAL | 2 refills | Status: DC

## 2021-07-11 ENCOUNTER — Other Ambulatory Visit: Payer: Self-pay | Admitting: Nurse Practitioner

## 2021-07-11 DIAGNOSIS — I10 Essential (primary) hypertension: Secondary | ICD-10-CM

## 2021-07-11 MED ORDER — AMLODIPINE BESYLATE 10 MG PO TABS: 10.0000 mg | ORAL_TABLET | Freq: Every day | ORAL | 2 refills | Status: DC

## 2021-07-25 ENCOUNTER — Encounter: Payer: Self-pay | Admitting: Nurse Practitioner

## 2021-07-25 ENCOUNTER — Ambulatory Visit (INDEPENDENT_AMBULATORY_CARE_PROVIDER_SITE_OTHER): Payer: No Typology Code available for payment source | Admitting: Nurse Practitioner

## 2021-07-25 ENCOUNTER — Other Ambulatory Visit: Payer: Self-pay

## 2021-07-25 VITALS — BP 130/70 | HR 67 | Temp 98.4°F | Ht 69.4 in | Wt 226.8 lb

## 2021-07-25 DIAGNOSIS — R7303 Prediabetes: Secondary | ICD-10-CM | POA: Diagnosis not present

## 2021-07-25 DIAGNOSIS — I10 Essential (primary) hypertension: Secondary | ICD-10-CM

## 2021-07-25 DIAGNOSIS — E6609 Other obesity due to excess calories: Secondary | ICD-10-CM | POA: Diagnosis not present

## 2021-07-25 DIAGNOSIS — Z6833 Body mass index (BMI) 33.0-33.9, adult: Secondary | ICD-10-CM | POA: Diagnosis not present

## 2021-07-25 NOTE — Progress Notes (Signed)
I,[name],acting as a Education administrator for [employer], NP.,have documented all relevant documentation on the behalf of [employer], NP,as directed by  Debbie Castilla, NP while in the presence of Debbie Castilla, NP.  This visit occurred during the SARS-CoV-2 public health emergency.  Safety protocols were in place, including screening questions prior to the visit, additional usage of staff PPE, and extensive cleaning of exam room while observing appropriate contact time as indicated for disinfecting solutions.  Subjective:     Patient ID: [name] , female    DOB: 14-Jun-1991 , 30 y.o.   MRN: [id number]   Chief Complaint  Patient presents with   Hypertension    HPI  Patient is here for htn follow up. She is not having HA as before. Her BP is better.  She has changed her diet and eating a little better.  She is feeling better. She is complaint with taking her BP med and her metformin twice daily.   Hypertension Pertinent negatives include no chest pain, headaches, palpitations or shortness of breath.    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   Current Outpatient Medications:    amLODipine (NORVASC) 10 MG tablet, Take 1 tablet (10 mg total) by mouth daily., Disp: 30 tablet, Rfl: 2   medroxyPROGESTERone (DEPO-PROVERA) 150 MG/ML injection, Inject 1 mL (150 mg total) into the muscle every 3 (three) months., Disp: 1 mL, Rfl: 3   metFORMIN (GLUCOPHAGE) 500 MG tablet, Take 1 tablet (500 mg total) by mouth 2 (two) times daily with a meal., Disp: 60 tablet, Rfl: 2  Current Facility-Administered Medications:    medroxyPROGESTERone (DEPO-PROVERA) injection 150 mg, 150 mg, Intramuscular, Q90 days, Salas, Debbie A, CNM, 150 mg at 12/05/20 1329   No Known Allergies   Review of Systems   Constitutional: Negative.  Negative for chills and fever.  HENT: Negative.  Negative for congestion, sinus pressure, sinus pain and sneezing.   Respiratory: Negative.  Negative for cough, shortness of breath and wheezing.   Cardiovascular: Negative.  Negative for chest pain and palpitations.  Gastrointestinal: Negative.   Neurological: Negative.  Negative for weakness and headaches.    Today's Vitals   07/25/21 0919  BP: 130/70  Pulse: 67  Temp: 98.4 F (36.9 C)  TempSrc: Oral  Weight: 226 lb 12.8 oz (102.9 kg)  Height: 5' 9.4" (1.763 m)   Body mass index is 33.11 kg/m.  Wt Readings from Last 3 Encounters:  07/25/21 226 lb 12.8 oz (102.9 kg)  06/26/21 230 lb 3.2 oz (104.4 kg)  05/16/21 232 lb (105.2 kg)    Objective:  Physical Exam Constitutional:      Appearance: Normal appearance.  HENT:     Head: Normocephalic and atraumatic.  Cardiovascular:     Rate and Rhythm: Normal rate and regular rhythm.     Pulses: Normal pulses.     Heart sounds: Normal heart sounds. No murmur heard. Pulmonary:     Effort: Pulmonary effort is normal. No respiratory distress.     Breath sounds: Normal breath sounds. No wheezing.  Skin:    General: Skin is warm and dry.     Capillary Refill: Capillary refill takes less than 2 seconds.  Neurological:     Mental Status: She is alert and oriented to  person, place, and time.        Assessment And Plan:     1. Hypertension, unspecified type -Chronic, stable, will continue meds, no changes.  -She is currently taking Amlodipine 10 mg once daily. She is doing better with her BP. Reviewed her BP log with her. Her BP runs 127-517G systolic. She does drink a lot of water at work. She is eating better.  -Limit the intake of processed foods and salt intake. You should increase your intake of green vegetables and fruits. Limit the use of alcohol. Limit fast foods and fried foods. Avoid high fatty saturated and trans fat foods. Keep yourself hydrated  with drinking water. Avoid red meats. Eat lean meats instead. Exercise for atleast 30-45 min for atleast 4-5 times a week.   2. Prediabetes -She currently takes Metformin twice daily. She is doing well with it.  -Continue, will reassess in 3 months  --Discussed with patient the importance of glycemic control and long term complications from uncontrolled diabetes. Discussed with the patient the importance of compliance with home glucose monitoring, diet which includes decrease amount of sugary drinks and foods. Importance of exercise was also discussed with the patient. Importance of eye exams, self foot care and compliance to office visits was also discussed with the patient.    3. Class 1 obesity due to excess calories with serious comorbidity and body mass index (BMI) of 33.0 to 33.9 in adult Advised patient on a healthy diet including avoiding fast food and red meats. Increase the intake of lean meats including grilled chicken and [place].  Drink a lot of water. Decrease intake of fatty foods. Exercise for 30-45 min. 4-5 a week to decrease the risk of cardiac event.   Follow up:  3-4 months for a physical exam   The patient was encouraged to call or send a message through [place] for any questions or concerns.   Staying healthy and adopting a healthy lifestyle for your overall health is important. You should eat 7 or more servings of fruits and vegetables per day. You should drink plenty of water to keep yourself hydrated and your kidneys healthy. This includes about 65-80+ fluid ounces of water. Limit your intake of animal fats especially for elevated cholesterol. Avoid highly processed food and limit your salt intake if you have hypertension. Avoid foods high in saturated/Trans fats. Along with a healthy diet it is also very important to maintain time for yourself to maintain a healthy mental health with low stress levels. You should get atleast 150 min of moderate intensity exercise weekly for a  healthy heart. Along with eating right and exercising, aim for at least 7-9 hours of sleep daily.  Eat more whole grains which includes barley, wheat berries, oats, brown rice and whole wheat pasta. Use healthy plant oils which include olive, soy, corn, sunflower and peanut. Limit your caffeine and sugary drinks. Limit your intake of fast foods. Limit milk and dairy products to one or two daily servings.   Patient was given opportunity to ask questions. Patient verbalized understanding of the plan and was able to repeat key elements of the plan. All questions were answered to their satisfaction.  Raman Arianna Haydon, DNP   I, Raman Cornella Emmer have reviewed all documentation for this visit. The documentation on 07/25/21 for the exam, diagnosis, procedures, and orders are all accurate and complete.   IF YOU HAVE BEEN REFERRED TO A SPECIALIST, IT MAY TAKE 1-2 WEEKS TO SCHEDULE/PROCESS THE REFERRAL. IF YOU HAVE NOT  HEARD FROM US/SPECIALIST IN TWO WEEKS, PLEASE GIVE [place] A CALL AT [phone].   THE PATIENT IS ENCOURAGED TO PRACTICE SOCIAL DISTANCING DUE TO THE COVID-19 PANDEMIC.

## 2021-07-25 NOTE — Patient Instructions (Signed)

## 2021-08-05 ENCOUNTER — Ambulatory Visit (INDEPENDENT_AMBULATORY_CARE_PROVIDER_SITE_OTHER): Payer: No Typology Code available for payment source

## 2021-08-05 ENCOUNTER — Other Ambulatory Visit: Payer: Self-pay

## 2021-08-05 DIAGNOSIS — Z3042 Encounter for surveillance of injectable contraceptive: Secondary | ICD-10-CM

## 2021-08-05 NOTE — Progress Notes (Signed)
Patient was assessed and managed by nursing staff during this encounter. I have reviewed the chart and agree with the documentation and plan. I have also made any necessary editorial changes.  Mora Bellman, MD 08/05/2021 10:11 AM

## 2021-08-05 NOTE — Progress Notes (Addendum)
Subjective:  Pt in for Depo Provera injection.    Objective: Need for contraception. No unusual complaints.    Assessment:  Depo given RD without difficulty   Plan:  Next injection due Jan 2-16  Administrations This Visit     medroxyPROGESTERone (DEPO-PROVERA) injection 150 mg     Admin Date 08/05/2021 Action Given Dose 150 mg Route Intramuscular Administered By Hollice Gong, CMA

## 2021-08-06 DIAGNOSIS — Z23 Encounter for immunization: Secondary | ICD-10-CM | POA: Diagnosis not present

## 2021-09-18 ENCOUNTER — Ambulatory Visit (INDEPENDENT_AMBULATORY_CARE_PROVIDER_SITE_OTHER): Payer: No Typology Code available for payment source | Admitting: Nurse Practitioner

## 2021-09-18 ENCOUNTER — Other Ambulatory Visit: Payer: Self-pay

## 2021-09-18 ENCOUNTER — Encounter: Payer: Self-pay | Admitting: Nurse Practitioner

## 2021-09-18 VITALS — BP 122/90 | HR 100 | Temp 99.0°F | Ht 69.4 in | Wt 223.4 lb

## 2021-09-18 DIAGNOSIS — R7303 Prediabetes: Secondary | ICD-10-CM | POA: Diagnosis not present

## 2021-09-18 DIAGNOSIS — I10 Essential (primary) hypertension: Secondary | ICD-10-CM | POA: Diagnosis not present

## 2021-09-18 DIAGNOSIS — E559 Vitamin D deficiency, unspecified: Secondary | ICD-10-CM | POA: Diagnosis not present

## 2021-09-18 DIAGNOSIS — R5383 Other fatigue: Secondary | ICD-10-CM

## 2021-09-18 DIAGNOSIS — E6609 Other obesity due to excess calories: Secondary | ICD-10-CM

## 2021-09-18 DIAGNOSIS — Z6832 Body mass index (BMI) 32.0-32.9, adult: Secondary | ICD-10-CM

## 2021-09-18 MED ORDER — HYDROCHLOROTHIAZIDE 12.5 MG PO TABS: 12.5000 mg | ORAL_TABLET | Freq: Every day | ORAL | 2 refills | Status: DC

## 2021-09-18 NOTE — Patient Instructions (Signed)

## 2021-09-18 NOTE — Progress Notes (Signed)
I,Jameka J Llittleton,acting as a Education administrator for [employer], NP.,have documented all relevant documentation on the behalf of [employer], NP,as directed by  Bary Castilla, NP while in the presence of Bary Castilla, NP.  This visit occurred during the SARS-CoV-2 public health emergency.  Safety protocols were in place, including screening questions prior to the visit, additional usage of staff PPE, and extensive cleaning of exam room while observing appropriate contact time as indicated for disinfecting solutions.  Subjective:     Patient ID: [name] , female    DOB: April 11, 1991 , 30 y.o.   MRN: [id number]   Chief Complaint  Patient presents with   Hypertension    HPI  Patient is here for htn follow up. She reports is having some dizziness when she stands up and she also complains of blurry vision. She reports her blood pressure is still really elevated 142/97 yesterday. She has tried changing her diet and cutting out sodas, she is now drinking more water, orange and cranberry juice. Her BP in today was 122/90. Denies SOB or chest pain.   Hypertension Associated symptoms include headaches. Pertinent negatives include no chest pain, palpitations or shortness of breath.    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   Current Outpatient Medications:    amLODipine (NORVASC) 10 MG tablet, Take 1 tablet (10 mg total) by mouth daily., Disp: 30 tablet, Rfl: 2   hydrochlorothiazide (HYDRODIURIL) 12.5 MG tablet, Take 1 tablet (12.5 mg total) by mouth daily., Disp: 30 tablet, Rfl: 2   medroxyPROGESTERone (DEPO-PROVERA) 150 MG/ML injection, Inject 1 mL (150 mg total) into the muscle every 3 (three) months., Disp: 1 mL, Rfl: 3   metFORMIN (GLUCOPHAGE) 500 MG tablet, Take 1 tablet (500 mg total) by  mouth 2 (two) times daily with a meal., Disp: 60 tablet, Rfl: 2  Current Facility-Administered Medications:    medroxyPROGESTERone (DEPO-PROVERA) injection 150 mg, 150 mg, Intramuscular, Q90 days, Denney, Rachelle A, CNM, 150 mg at 08/05/21 0907   No Known Allergies   Review of Systems  Eyes:  Positive for visual disturbance.  Respiratory:  Negative for shortness of breath and wheezing.   Cardiovascular:  Negative for chest pain and palpitations.  Gastrointestinal:  Negative for constipation and diarrhea.  Neurological:  Positive for light-headedness and headaches.    Today's Vitals   09/18/21 1001  BP: 122/90  Pulse: 100  Temp: 99 F (37.2 C)  Weight: 223 lb 6.4 oz (101.3 kg)  Height: 5' 9.4" (1.763 m)  PainSc: 0-No pain   Body mass index is 32.61 kg/m.  Wt Readings from Last 3 Encounters:  09/18/21 223 lb 6.4 oz (101.3 kg)  07/25/21 226 lb 12.8 oz (102.9 kg)  06/26/21 230 lb 3.2 oz (104.4 kg)     Objective:  Physical Exam Constitutional:      Appearance: Normal appearance. She is obese.  HENT:     Head: Normocephalic and atraumatic.  Cardiovascular:     Rate and Rhythm: Normal rate and regular rhythm.     Pulses: Normal pulses.     Heart sounds: Normal heart sounds. No murmur heard. Pulmonary:     Effort: Pulmonary effort is normal. No respiratory distress.     Breath sounds: Normal breath sounds. No wheezing.  Skin:  Capillary Refill: Capillary refill takes less than 2 seconds.  Neurological:     Mental Status: She is alert.        Assessment And Plan:     1. Hypertension, unspecified type -She is taking amlodipine 10 mg daily  -Reports BP reading at home to be in the 140's will add hydrochlorothiazide.  -Advised pt. To continue to monitor BP and let us know her reading.  - hydrochlorothiazide (HYDRODIURIL) 12.5 MG tablet; Take 1 tablet (12.5 mg total) by mouth daily.  Dispense: 30 tablet; Refill: 2  2. Prediabetes - Hemoglobin A1c  3. Other  fatigue - CBC no Diff - TSH + free T4  4. Vitamin D deficiency - Vitamin D (25 hydroxy)  5. Class 1 obesity due to excess calories with body mass index (BMI) of 32.0 to 32.9 in adult, unspecified whether serious comorbidity present  -Advised patient on a healthy diet including avoiding fast food and red meats. Increase the intake of lean meats including grilled chicken and [place].  Drink a lot of water. Decrease intake of fatty foods. Exercise for 30-45 min. 4-5 a week to decrease the risk of cardiac event.   Follow up: if symptoms persist or do not get better.   The patient was encouraged to call or send a message through [place] for any questions or concerns.   Side effects and appropriate use of all the medication(s) were discussed with the patient today. Patient advised to use the medication(s) as directed by their healthcare provider. The patient was encouraged to read, review, and understand all associated package inserts and contact our office with any questions or concerns. The patient accepts the risks of the treatment plan and had an opportunity to ask questions.   Patient was given opportunity to ask questions. Patient verbalized understanding of the plan and was able to repeat key elements of the plan. All questions were answered to their satisfaction.  Raman Tiea Manninen, DNP   I, Raman Ladanian Kelter have reviewed all documentation for this visit. The documentation on 02/28/21 for the exam, diagnosis, procedures, and orders are all accurate and complete.    IF YOU HAVE BEEN REFERRED TO A SPECIALIST, IT MAY TAKE 1-2 WEEKS TO SCHEDULE/PROCESS THE REFERRAL. IF YOU HAVE NOT HEARD FROM US/SPECIALIST IN TWO WEEKS, PLEASE GIVE [place] A CALL AT [phone].   THE PATIENT IS ENCOURAGED TO PRACTICE SOCIAL DISTANCING DUE TO THE COVID-19 PANDEMIC.

## 2021-09-19 ENCOUNTER — Other Ambulatory Visit: Payer: Self-pay | Admitting: Nurse Practitioner

## 2021-09-19 DIAGNOSIS — D508 Other iron deficiency anemias: Secondary | ICD-10-CM

## 2021-09-19 DIAGNOSIS — E559 Vitamin D deficiency, unspecified: Secondary | ICD-10-CM

## 2021-09-19 LAB — CBC
Hematocrit: 40.2 % (ref 34.0–46.6)
Hemoglobin: 12.5 g/dL (ref 11.1–15.9)
MCH: 23.1 pg — ABNORMAL LOW (ref 26.6–33.0)
MCHC: 31.1 g/dL — ABNORMAL LOW (ref 31.5–35.7)
MCV: 74 fL — ABNORMAL LOW (ref 79–97)
Platelets: 237 10*3/uL (ref 150–450)
RBC: 5.4 x10E6/uL — ABNORMAL HIGH (ref 3.77–5.28)
RDW: 14.5 % (ref 11.7–15.4)
WBC: 5.7 10*3/uL (ref 3.4–10.8)

## 2021-09-19 LAB — HEMOGLOBIN A1C
Est. average glucose Bld gHb Est-mCnc: 123 mg/dL
Hgb A1c MFr Bld: 5.9 % — ABNORMAL HIGH (ref 4.8–5.6)

## 2021-09-19 LAB — TSH+FREE T4
Free T4: 1.35 ng/dL (ref 0.82–1.77)
TSH: 1.2 u[IU]/mL (ref 0.450–4.500)

## 2021-09-19 LAB — VITAMIN D 25 HYDROXY (VIT D DEFICIENCY, FRACTURES): Vit D, 25-Hydroxy: 12.2 ng/mL — ABNORMAL LOW (ref 30.0–100.0)

## 2021-09-19 MED ORDER — VITAMIN D (ERGOCALCIFEROL) 1.25 MG (50000 UNIT) PO CAPS: 50000.0000 [IU] | ORAL_CAPSULE | ORAL | 1 refills | Status: DC

## 2021-09-19 MED ORDER — FERROUS SULFATE 325 (65 FE) MG PO TABS: 325.0000 mg | ORAL_TABLET | Freq: Every day | ORAL | 0 refills | Status: DC

## 2021-09-29 ENCOUNTER — Other Ambulatory Visit: Payer: Self-pay | Admitting: Nurse Practitioner

## 2021-09-29 DIAGNOSIS — R7303 Prediabetes: Secondary | ICD-10-CM

## 2021-09-29 DIAGNOSIS — I10 Essential (primary) hypertension: Secondary | ICD-10-CM

## 2021-10-18 ENCOUNTER — Other Ambulatory Visit: Payer: Self-pay | Admitting: Obstetrics

## 2021-10-19 ENCOUNTER — Other Ambulatory Visit: Payer: Self-pay | Admitting: Nurse Practitioner

## 2021-10-19 DIAGNOSIS — D508 Other iron deficiency anemias: Secondary | ICD-10-CM

## 2021-10-22 ENCOUNTER — Other Ambulatory Visit: Payer: Self-pay

## 2021-10-22 ENCOUNTER — Ambulatory Visit (INDEPENDENT_AMBULATORY_CARE_PROVIDER_SITE_OTHER): Payer: PRIVATE HEALTH INSURANCE

## 2021-10-22 VITALS — BP 120/73 | HR 74 | Ht 69.0 in | Wt 219.0 lb

## 2021-10-22 DIAGNOSIS — Z3042 Encounter for surveillance of injectable contraceptive: Secondary | ICD-10-CM | POA: Diagnosis not present

## 2021-10-22 MED ORDER — MEDROXYPROGESTERONE ACETATE 150 MG/ML IM SUSP
150.0000 mg | Freq: Once | INTRAMUSCULAR | Status: AC
  Administered 2021-10-22: 150 mg via INTRAMUSCULAR

## 2021-10-22 NOTE — Progress Notes (Addendum)
°  SUBJECTIVE: [name] is a 31 y.o. female who presents for DEPO Injection.  OBJECTIVE: Appears well, in no apparent distress.  Vital signs are normal.   ASSESSMENT: Need for [facility], she is on time for DEPO .   Date last PAP: 01/15/2021.  PLAN:  Depo-Provera 150 mg IM given in LD, tolerated well.  Next appointment due March 21 - January 21, 2022.   Administrations This Visit     medroxyPROGESTERone (DEPO-PROVERA) injection 150 mg     Admin Date 10/22/2021 Action Given Dose 150 mg Route Intramuscular Administered By Tamela Oddi, RMA              Patient was assessed and managed by nursing staff during this encounter. I have reviewed the chart and agree with the documentation and plan.   Virginia Rochester, NP 10/22/2021 12:19 PM

## 2021-10-30 ENCOUNTER — Encounter: Payer: Self-pay | Admitting: Nurse Practitioner

## 2021-10-30 ENCOUNTER — Encounter: Payer: Medicaid Other | Admitting: Nurse Practitioner

## 2021-11-18 ENCOUNTER — Encounter (HOSPITAL_COMMUNITY): Payer: Self-pay | Admitting: Emergency Medicine

## 2021-11-18 ENCOUNTER — Emergency Department (HOSPITAL_COMMUNITY): Payer: PRIVATE HEALTH INSURANCE

## 2021-11-18 ENCOUNTER — Emergency Department (HOSPITAL_COMMUNITY)
Admission: EM | Admit: 2021-11-18 | Discharge: 2021-11-19 | Disposition: A | Discharge status: Home / Self Care | Payer: PRIVATE HEALTH INSURANCE | Attending: Emergency Medicine | Admitting: Emergency Medicine

## 2021-11-18 ENCOUNTER — Other Ambulatory Visit: Payer: Self-pay

## 2021-11-18 DIAGNOSIS — R202 Paresthesia of skin: Secondary | ICD-10-CM | POA: Insufficient documentation

## 2021-11-18 DIAGNOSIS — R0789 Other chest pain: Secondary | ICD-10-CM | POA: Diagnosis not present

## 2021-11-18 DIAGNOSIS — R9431 Abnormal electrocardiogram [ECG] [EKG]: Secondary | ICD-10-CM | POA: Diagnosis not present

## 2021-11-18 DIAGNOSIS — N9489 Other specified conditions associated with female genital organs and menstrual cycle: Secondary | ICD-10-CM | POA: Diagnosis not present

## 2021-11-18 DIAGNOSIS — R079 Chest pain, unspecified: Secondary | ICD-10-CM | POA: Diagnosis not present

## 2021-11-18 LAB — I-STAT BETA HCG BLOOD, ED (MC, WL, AP ONLY): I-stat hCG, quantitative: <5 m[IU]/mL (ref <5)

## 2021-11-18 LAB — CBC
HCT: 40.6 % (ref 36.0–46.0)
Hemoglobin: 12.6 g/dL (ref 12.0–15.0)
MCH: 23.2 pg — ABNORMAL LOW (ref 26.0–34.0)
MCHC: 31 g/dL (ref 30.0–36.0)
MCV: 74.8 fL — ABNORMAL LOW (ref 80.0–100.0)
Platelets: ADEQUATE 10*3/uL (ref 150–400)
RBC: 5.43 MIL/uL — ABNORMAL HIGH (ref 3.87–5.11)
RDW: 14.3 % (ref 11.5–15.5)
WBC: 7.5 10*3/uL (ref 4.0–10.5)
nRBC: 0 % (ref 0.0–0.2)

## 2021-11-18 LAB — BASIC METABOLIC PANEL
Anion gap: 7 (ref 5–15)
BUN: 8 mg/dL (ref 6–20)
CO2: 23 mmol/L (ref 22–32)
Calcium: 8.8 mg/dL — ABNORMAL LOW (ref 8.9–10.3)
Chloride: 107 mmol/L (ref 98–111)
Creatinine, Ser: 0.8 mg/dL (ref 0.44–1.00)
GFR, Estimated: >60 mL/min (ref >60)
Glucose, Bld: 100 mg/dL — ABNORMAL HIGH (ref 70–99)
Potassium: 3.5 mmol/L (ref 3.5–5.1)
Sodium: 137 mmol/L (ref 135–145)

## 2021-11-18 LAB — TROPONIN I (HIGH SENSITIVITY)
Troponin I (High Sensitivity): <2 ng/L (ref <18)
Troponin I (High Sensitivity): <2 ng/L (ref <18)

## 2021-11-18 NOTE — ED Provider Triage Note (Signed)
Emergency Medicine Provider Triage Evaluation Note  [name] , a 31 y.o. female  was evaluated in triage.  Pt complains of chest pain and left arm numbness over the past week.  Patient states that her pain is worse on the left side of her chest, and moved to the center of her chest and up into her throat today.  She also reports her left hand is tingling.  She describes her pain as a tightness in her chest, that is worse with expiration.  She recently started medication for hypertension about 3 months ago.  No leg pain or swelling.  Review of Systems  Positive: Chest pain Negative: Shortness of breath, fever, chills, cough, abdominal pain, nausea, vomiting, leg swelling  Physical Exam  BP (!) 141/100    Pulse 80    Temp 99 F (37.2 C) (Oral)    Resp 18    Ht 5\' 10"  (1.778 m)    Wt 79.4 kg    SpO2 100%    BMI 25.11 kg/m  Gen:   Awake, no distress   Resp:  Normal effort  MSK:   Moves extremities without difficulty  Other:    Medical Decision Making  Medically screening exam initiated at 6:12 PM.  Appropriate orders placed.  [name] was informed that the remainder of the evaluation will be completed by another provider, this initial triage assessment does not replace that evaluation, and the importance of remaining in the ED until their evaluation is complete.     Ysidro Ramsay T, PA-C 11/18/21 1815

## 2021-11-18 NOTE — ED Triage Notes (Signed)
Patient reports left sided chest pain over the past week. Today pain moved to center of chest and up into her throat. Reports left hand tingling. Describes it as tightness in chest. Started meds for HTN about 2-3 months ago.

## 2021-11-19 ENCOUNTER — Encounter: Payer: Self-pay | Admitting: Nurse Practitioner

## 2021-11-19 MED ORDER — FAMOTIDINE 40 MG PO TABS: 40.0000 mg | ORAL_TABLET | Freq: Every day | ORAL | 0 refills | Status: DC

## 2021-11-19 NOTE — Discharge Instructions (Addendum)
As we discussed you may always return to the emergency room for any new or concerning symptoms   Please take the medicine Pepcid as prescribed.  Please follow-up with your primary care doctor.  Drinking of water, take Tylenol 1000 mg as needed for chest pain.

## 2021-11-19 NOTE — ED Provider Notes (Signed)
[name] Provider Note   CSN: [id number] Arrival date & time: 11/18/21  1738     History  Chief Complaint  Patient presents with   Chest Pain    [name] is a 31 y.o. female.   Chest Pain  [name] is a 31 y.o. female with PMH of seizure and migraine who presents to the ED today with 1 week of intermittent chest pain and one episode of tingling in the left hand. She has had one episode of chest pain and tightness that she rates as a 7/10 every day for 7 days that lasts for approximately 1 hour. The pain was originally left sided but is now more central. She describes feeling "tightness spread to her throat" but denies any difficulty breathing when this occurs. The pain is exacerbated by exhaling, deep breaths, and moving her left arm. She denies any injury or trauma. The pain is not associated with exertion or other known triggers. 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 She has not tried anything to improve the pain and is able to continue working during these episodes. She does not smoke. She denies current CP, SOB, palpitations, HA, vision changes, dizziness, n/v, abdominal pain, and leg pain      Home Medications Prior to Admission medications   Medication Sig Start Date End Date Taking? Authorizing Provider  famotidine (PEPCID) 40 MG tablet Take 1 tablet (40 mg total) by mouth daily. 11/19/21  Yes Tanvir Hipple S, PA  amLODipine (NORVASC) 10 MG tablet TAKE 1 TABLET BY MOUTH EVERY DAY 09/30/21   Minette Brine, FNP  ferrous sulfate 325 (65 FE) MG tablet TAKE 1 TABLET BY MOUTH EVERY DAY 10/21/21   Ghumman, Ramandeep, NP  hydrochlorothiazide (HYDRODIURIL) 12.5 MG tablet Take 1 tablet (12.5 mg total) by mouth daily. 09/18/21   Bary Castilla, NP  ibuprofen (ADVIL) 800 MG tablet TAKE 1 TABLET BY MOUTH EVERY 8 HOURS AS NEEDED 10/18/21   Shelly Bombard, MD  medroxyPROGESTERone (DEPO-PROVERA) 150 MG/ML injection Inject  1 mL (150 mg total) into the muscle every 3 (three) months. 12/05/20   Chancy Milroy, MD  metFORMIN (GLUCOPHAGE) 500 MG tablet TAKE 1 TABLET BY MOUTH 2 TIMES DAILY WITH A MEAL. 09/30/21   Minette Brine, FNP  Vitamin D, Ergocalciferol, (DRISDOL) 1.25 MG (50000 UNIT) CAPS capsule Take 1 capsule (50,000 Units total) by mouth every 7 (seven) days. 09/19/21   Bary Castilla, NP      Allergies    Patient has no known allergies.    Review of Systems   Review of Systems  Cardiovascular:  Positive for chest pain.   Physical Exam Updated Vital Signs BP 118/75 (BP Location: Right Arm)    Pulse 72    Temp 98.3 F (36.8 C) (Oral)    Resp 20    Ht 5\' 10"  (1.778 m)    Wt 79.4 kg    SpO2 100%    BMI 25.11 kg/m  Physical Exam Vitals and nursing note reviewed.  Constitutional:      General: She is not in acute distress.    Comments: Pleasant well-appearing 31 year old.  In no acute distress.  Sitting comfortably in bed.  Able answer questions appropriately follow commands. No increased work of breathing. Speaking in full sentences.   HENT:     Head: Normocephalic and atraumatic.     Nose: Nose normal.  Eyes:     General: No scleral icterus. Cardiovascular:  Rate and Rhythm: Normal rate and regular rhythm.     Pulses: Normal pulses.     Heart sounds: Normal heart sounds.  Pulmonary:     Effort: Pulmonary effort is normal. No respiratory distress.     Breath sounds: Normal breath sounds. No wheezing.  Chest:     Chest wall: No tenderness.  Abdominal:     Palpations: Abdomen is soft.     Tenderness: There is no abdominal tenderness. There is no guarding or rebound.  Musculoskeletal:     Cervical back: Normal range of motion.     Right lower leg: No edema.     Left lower leg: No edema.  Skin:    General: Skin is warm and dry.     Capillary Refill: Capillary refill takes less than 2 seconds.  Neurological:     Mental Status: She is alert. Mental status is at baseline.  Psychiatric:         Mood and Affect: Mood normal.        Behavior: Behavior normal.    ED Results / Procedures / Treatments   Labs (all labs ordered are listed, but only abnormal results are displayed) Labs Reviewed  BASIC METABOLIC PANEL - Abnormal; Notable for the following components:      Result Value   Glucose, Bld 100 (*)    Calcium 8.8 (*)    All other components within normal limits  CBC - Abnormal; Notable for the following components:   RBC 5.43 (*)    MCV 74.8 (*)    MCH 23.2 (*)    All other components within normal limits  I-STAT BETA HCG BLOOD, ED ([facility], [facility], [facility] ONLY)  TROPONIN I (HIGH SENSITIVITY)  TROPONIN I (HIGH SENSITIVITY)    EKG EKG Interpretation  Date/Time:  Monday November 18 2021 17:49:40 EST Ventricular Rate:  89 PR Interval:  160 QRS Duration: 82 QT Interval:  354 QTC Calculation: 430 R Axis:   66 Text Interpretation: Normal sinus rhythm Normal ECG When compared with ECG of 24-Jun-2021 17:12, No significant change since last tracing Confirmed by Gareth Morgan [phone]) on 11/19/2021 8:23:25 AM  Radiology DG Chest 2 View  Result Date: 11/18/2021 CLINICAL DATA:  Left-sided chest pain for 1 week, initial encounter EXAM: CHEST - 2 VIEW COMPARISON:  06/24/2021 FINDINGS: The heart size and mediastinal contours are within normal limits. Both lungs are clear. The visualized skeletal structures are unremarkable. IMPRESSION: No active cardiopulmonary disease. Electronically Signed   By: Inez Catalina M.D.   On: 11/18/2021 19:12    Procedures Procedures    Medications Ordered in ED Medications - No data to display  ED Course/ Medical Decision Making/ A&P                           Medical Decision Making Risk Prescription drug management.   This patient presents to the ED for concern of chest pain, this involves a number of treatment options, and is a complaint that carries with it a high risk of complications and morbidity.    Co morbidities: Discussed in  HPI   Brief History:  Patient is 31 year old female without any significant pertinent past medical history presented emergency room today with complaints of intermittent chest pain for 1 week.  Seemingly comes on at rest without any significant provocation some mild associated shortness of breath but no nausea vomiting lightheadedness dizziness diaphoresis  Symptom-free now.  EMR reviewed including pt PMHx, past surgical  history and past visits to ER.   See HPI for more details   Lab Tests:  I ordered and independently interpreted labs.  The pertinent results include:    I personally reviewed all laboratory work and imaging. Metabolic panel without any acute abnormality specifically kidney function within normal limits and no significant electrolyte abnormalities. CBC without leukocytosis or significant anemia. Troponin x2 within normal limits.  I-STAT hCG differences.  BMP and CBC unremarkable  Imaging Studies:  NAD. I personally reviewed all imaging studies and no acute abnormality found. I agree with radiology interpretation. Chest x-ray without acute abnormality   Cardiac Monitoring:  The patient was maintained on a cardiac monitor.  I personally viewed and interpreted the cardiac monitored which showed an underlying rhythm of: NSR EKG non-ischemic   Medicines ordered:  No medications ordered patient is asymptomatic  Critical Interventions:    Consults:      Reevaluation:  After the interventions noted above I re-evaluated patient and found that they have :stayed the same asymptomatic   Social Determinants of Health:  The patient's social determinants of health were a factor in the care of this patient    Problem List / ED Course:  Chest pain.  Atypical.  The emergent causes of chest pain include: Acute coronary syndrome, tamponade, pericarditis/myocarditis, aortic dissection, pulmonary embolism, tension pneumothorax, pneumonia, and esophageal  rupture.  I do not believe the patient has an emergent cause of chest pain, other urgent/non-acute considerations include, but are not limited to: chronic angina, aortic stenosis, cardiomyopathy, mitral valve prolapse, pulmonary hypertension, aortic insufficiency, right ventricular hypertrophy, pleuritis, bronchitis, pneumothorax, tumor, gastroesophageal reflux disease (GERD), esophageal spasm, Mallory-Weiss syndrome, peptic ulcer disease, pancreatitis, functional gastrointestinal pain, cervical or thoracic disk disease or arthritis, shoulder arthritis, costochondritis, subacromial bursitis, anxiety or panic attack, herpes zoster, breast disorders, chest wall tumors, thoracic outlet syndrome, mediastinitis.  Patient is PERC negative No recent surgeries, hospitalization, long travel, hemoptysis, estrogen containing OCP, cancer history.  No unilateral leg swelling.  No history of PE or VTE.   I have very low suspicion for PE, PTX, pneumonia, ACS, tamponade, peri or myocarditis  Dispostion:  After consideration of the diagnostic results and the patients response to treatment, I feel that the patent would benefit from follow-up with PCP.  Will prescribe Pepcid.  Return precautions given and understood.           Final Clinical Impression(s) / ED Diagnoses Final diagnoses:  Atypical chest pain    Rx / DC Orders ED Discharge Orders          Ordered    famotidine (PEPCID) 40 MG tablet  Daily        11/19/21 1014              Pati Gallo [place], [place] 11/19/21 1443    Elnora Morrison, MD 11/20/21 1610

## 2021-11-20 ENCOUNTER — Telehealth: Payer: Self-pay

## 2021-11-20 ENCOUNTER — Telehealth: Payer: Self-pay | Admitting: *Deleted

## 2021-11-20 NOTE — Telephone Encounter (Signed)
Transition Care Management Unsuccessful Follow-up Telephone Call  Date of discharge and from where:  11/19/[name]  Attempts:  1st Attempt  Reason for unsuccessful TCM follow-up call:  Voice mail full

## 2021-11-20 NOTE — Telephone Encounter (Signed)
Transition Care Management Follow-up Telephone Call Date of discharge and from where: [facility]/18/[facility] How have you been since you were released from the hospital? Pt reports being alright.  Any questions or concerns? No  Items Reviewed: Did the pt receive and understand the discharge instructions provided? Yes  Medications obtained and verified? Yes  Other? Yes  Any new allergies since your discharge? No  Dietary orders reviewed? Yes Do you have support at home? Yes   Home Care and Equipment/Supplies: Were home health services ordered? no If so, what is the name of the agency? N/a   Has the agency set up a time to come to the patient's home? not applicable Were any new equipment or medical supplies ordered?  No What is the name of the medical supply agency? N/a Were you able to get the supplies/equipment? not applicable Do you have any questions related to the use of the equipment or supplies? No  Functional Questionnaire: (I = Independent and D = Dependent) ADLs: i  Bathing/Dressing- i  Meal Prep- i  Eating- i  Maintaining continence- i  Transferring/Ambulation- i  Managing Meds- i  Follow up appointments reviewed:  PCP Hospital f/u appt confirmed? Yes  Scheduled to see Minette Brine on 11/21/2021 @ virtual. [facility] f/u appt confirmed? No  Scheduled to see n/a on n/a @ n/a. Are transportation arrangements needed? No  If their condition worsens, is the pt aware to call PCP or go to the Emergency Dept.? Yes Was the patient provided with contact information for the PCP's office or ED? Yes Was to pt encouraged to call back with questions or concerns? Yes

## 2021-11-21 ENCOUNTER — Telehealth (INDEPENDENT_AMBULATORY_CARE_PROVIDER_SITE_OTHER): Payer: PRIVATE HEALTH INSURANCE | Admitting: Nurse Practitioner

## 2021-11-21 ENCOUNTER — Encounter: Payer: Self-pay | Admitting: Nurse Practitioner

## 2021-11-21 DIAGNOSIS — R0789 Other chest pain: Secondary | ICD-10-CM

## 2021-11-21 DIAGNOSIS — I10 Essential (primary) hypertension: Secondary | ICD-10-CM

## 2021-11-21 MED ORDER — MAGNESIUM 200 MG PO TABS: 1.0000 | ORAL_TABLET | Freq: Every day | ORAL | 2 refills | Status: DC

## 2021-11-21 NOTE — Patient Instructions (Signed)
Managing Your Hypertension Hypertension, also called high blood pressure, is when the force of the blood pressing against the walls of the arteries is too strong. Arteries are blood vessels that carry blood from your heart throughout your body. Hypertension forces the heart to work harder to pump blood and may cause the arteries to become narrow or stiff. Understanding blood pressure readings Your personal target blood pressure may vary depending on your medical conditions, your age, and other factors. A blood pressure reading includes a higher number over a lower number. Ideally, your blood pressure should be below 120/80. You should know that: The first, or top, number is called the systolic pressure. It is a measure of the pressure in your arteries as your heart beats. The second, or bottom number, is called the diastolic pressure. It is a measure of the pressure in your arteries as the heart relaxes. Blood pressure is classified into four stages. Based on your blood pressure reading, your health care provider may use the following stages to determine what type of treatment you need, if any. Systolic pressure and diastolic pressure are measured in a unit called mmHg. Normal Systolic pressure: below 120. Diastolic pressure: below 80. Elevated Systolic pressure: 120-129. Diastolic pressure: below 80. Hypertension stage 1 Systolic pressure: 130-139. Diastolic pressure: 80-89. Hypertension stage 2 Systolic pressure: 140 or above. Diastolic pressure: 90 or above. How can this condition affect me? Managing your hypertension is an important responsibility. Over time, hypertension can damage the arteries and decrease blood flow to important parts of the body, including the brain, heart, and kidneys. Having untreated or uncontrolled hypertension can lead to: A heart attack. A stroke. A weakened blood vessel (aneurysm). Heart failure. Kidney damage. Eye damage. Metabolic syndrome. Memory and  concentration problems. Vascular dementia. What actions can I take to manage this condition? Hypertension can be managed by making lifestyle changes and possibly by taking medicines. Your health care provider will help you make a plan to bring your blood pressure within a normal range. Nutrition  Eat a diet that is high in fiber and potassium, and low in salt (sodium), added sugar, and fat. An example eating plan is called the Dietary Approaches to Stop Hypertension (DASH) diet. To eat this way: Eat plenty of fresh fruits and vegetables. Try to fill one-half of your plate at each meal with fruits and vegetables. Eat whole grains, such as whole-wheat pasta, brown rice, or whole-grain bread. Fill about one-fourth of your plate with whole grains. Eat low-fat dairy products. Avoid fatty cuts of meat, processed or cured meats, and poultry with skin. Fill about one-fourth of your plate with lean proteins such as fish, chicken without skin, beans, eggs, and tofu. Avoid pre-made and processed foods. These tend to be higher in sodium, added sugar, and fat. Reduce your daily sodium intake. Most people with hypertension should eat less than 1,500 mg of sodium a day. Lifestyle  Work with your health care provider to maintain a healthy body weight or to lose weight. Ask what an ideal weight is for you. Get at least 30 minutes of exercise that causes your heart to beat faster (aerobic exercise) most days of the week. Activities may include walking, swimming, or biking. Include exercise to strengthen your muscles (resistance exercise), such as weight lifting, as part of your weekly exercise routine. Try to do these types of exercises for 30 minutes at least 3 days a week. Do not use any products that contain nicotine or tobacco, such as cigarettes, e-cigarettes,   and chewing tobacco. If you need help quitting, ask your health care provider. Control any long-term (chronic) conditions you have, such as high  cholesterol or diabetes. Identify your sources of stress and find ways to manage stress. This may include meditation, deep breathing, or making time for fun activities. Alcohol use Do not drink alcohol if: Your health care provider tells you not to drink. You are pregnant, may be pregnant, or are planning to become pregnant. If you drink alcohol: Limit how much you use to: 0-1 drink a day for women. 0-2 drinks a day for men. Be aware of how much alcohol is in your drink. In the [place] one drink equals one 12 oz bottle of beer (355 mL), one 5 oz glass of wine (148 mL), or one 1 oz glass of hard liquor (44 mL). Medicines Your health care provider may prescribe medicine if lifestyle changes are not enough to get your blood pressure under control and if: Your systolic blood pressure is 130 or higher. Your diastolic blood pressure is 80 or higher. Take medicines only as told by your health care provider. Follow the directions carefully. Blood pressure medicines must be taken as told by your health care provider. The medicine does not work as well when you skip doses. Skipping doses also puts you at risk for problems. Monitoring Before you monitor your blood pressure: Do not smoke, drink caffeinated beverages, or exercise within 30 minutes before taking a measurement. Use the bathroom and empty your bladder (urinate). Sit quietly for at least 5 minutes before taking measurements. Monitor your blood pressure at home as told by your health care provider. To do this: Sit with your back straight and supported. Place your feet flat on the floor. Do not cross your legs. Support your arm on a flat surface, such as a table. Make sure your upper arm is at heart level. Each time you measure, take two or three readings one minute apart and record the results. You may also need to have your blood pressure checked regularly by your health care provider. General information Talk with your health care  provider about your diet, exercise habits, and other lifestyle factors that may be contributing to hypertension. Review all the medicines you take with your health care provider because there may be side effects or interactions. Keep all visits as told by your health care provider. Your health care provider can help you create and adjust your plan for managing your high blood pressure. Where to find more information National Heart, Lung, and Blood Institute: www.nhlbi.nih.gov American Heart Association: www.heart.org Contact a health care provider if: You think you are having a reaction to medicines you have taken. You have repeated (recurrent) headaches. You feel dizzy. You have swelling in your ankles. You have trouble with your vision. Get help right away if: You develop a severe headache or confusion. You have unusual weakness or numbness, or you feel faint. You have severe pain in your chest or abdomen. You vomit repeatedly. You have trouble breathing. These symptoms may represent a serious problem that is an emergency. Do not wait to see if the symptoms will go away. Get medical help right away. Call your local emergency services (911 in the [place] Do not drive yourself to the hospital. Summary Hypertension is when the force of blood pumping through your arteries is too strong. If this condition is not controlled, it may put you at risk for serious complications. Your personal target blood pressure may vary depending on   your medical conditions, your age, and other factors. For most people, a normal blood pressure is less than 120/80. Hypertension is managed by lifestyle changes, medicines, or both. Lifestyle changes to help manage hypertension include losing weight, eating a healthy, low-sodium diet, exercising more, stopping smoking, and limiting alcohol. This information is not intended to replace advice given to you by your health care provider. Make sure you discuss any questions  you have with your health care provider. Document Revised: 10/24/2019 Document Reviewed: 09/06/2019 Elsevier Patient Education  2022 Elsevier Inc.  

## 2021-11-21 NOTE — Progress Notes (Signed)
Virtual Visit via MyChart   This visit type was conducted due to national recommendations for restrictions regarding the COVID-19 Pandemic (e.g. social distancing) in an effort to limit this patient's exposure and mitigate transmission in our community.  Due to her co-morbid illnesses, this patient is at least at moderate risk for complications without adequate follow up.  This format is felt to be most appropriate for this patient at this time.  All issues noted in this document were discussed and addressed.  A limited physical exam was performed with this format.    This visit type was conducted due to national recommendations for restrictions regarding the COVID-19 Pandemic (e.g. social distancing) in an effort to limit this patient's exposure and mitigate transmission in our community.  Patients identity confirmed using two different identifiers.  This format is felt to be most appropriate for this patient at this time.  All issues noted in this document were discussed and addressed.  No physical exam was performed (except for noted visual exam findings with Video Visits).    Date:  11/21/2021   ID:  [name], DOB 07-12-1991, MRN [id number]  Patient Location:  Work - spoke with Ferdinand Lango  Provider location:   Office    Chief Complaint:  follow up ER visit   History of Present Illness:    [name] is a 31 y.o. female who presents via video conferencing for a telehealth visit today.    The patient does have symptoms concerning for COVID-19 infection (fever, chills, cough, or new shortness of breath).   Patient presents for ED follow up. She was seen in the ED for chest pain and elevated blood pressure.  She is checking her blood pressure every other hour when she is at work. 144/100 and stays around 143/95 (mostly). No swelling to her feet or ankles. She has never seen a Cardiologist in the past. She has a pulling, tight feeling. Pain is worse when she takes a  deep breath.     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   ferrous sulfate 325 (65 FE) MG tablet TAKE 1 TABLET BY MOUTH EVERY DAY   hydrochlorothiazide (HYDRODIURIL) 12.5 MG tablet Take 1 tablet (12.5 mg total) by mouth daily.   ibuprofen (ADVIL) 800 MG tablet TAKE 1 TABLET BY MOUTH EVERY 8 HOURS AS NEEDED   Magnesium 200 MG TABS Take 1 tablet (200 mg total) by mouth daily. Take with evening meal   medroxyPROGESTERone (DEPO-PROVERA) 150 MG/ML injection Inject 1 mL (150 mg total) into the muscle every 3 (three) months.   metFORMIN (GLUCOPHAGE) 500 MG tablet TAKE 1 TABLET BY MOUTH 2 TIMES DAILY WITH A MEAL.   Vitamin D, Ergocalciferol, (DRISDOL) 1.25 MG (50000 UNIT) CAPS capsule Take 1 capsule (50,000 Units total)  by mouth every 7 (seven) days.   Current Facility-Administered Medications for the 11/21/21 encounter (Video Visit) with Minette Brine, FNP  Medication   medroxyPROGESTERone (DEPO-PROVERA) injection 150 mg     Allergies:   Patient has no known allergies.   Social History   Tobacco Use   Smoking status: Never   Smokeless tobacco: Never  Vaping Use   Vaping Use: Never used  Substance Use Topics   Alcohol use: Yes    Alcohol/week: 4.0 standard drinks    Types: 4 Shots of liquor per week   Drug use: No     Family Hx: The patient's family history includes Heart failure in  her father and mother; Hypertension in her father and mother.  ROS:   Please see the history of present illness.    Review of Systems  Constitutional: Negative.   Respiratory: Negative.    Cardiovascular: Negative.   Gastrointestinal: Negative.   Neurological: Negative.   Psychiatric/Behavioral: Negative.     All other systems reviewed and are negative.   Labs/Other Tests and Data Reviewed:    Recent Labs: 06/24/2021: ALT 16 09/18/2021: TSH 1.200 11/18/2021: BUN 8; Creatinine, Ser 0.80; Hemoglobin 12.6; Platelets PLATELET CLUMPS NOTED ON SMEAR, COUNT APPEARS ADEQUATE; Potassium 3.5; Sodium 137   Recent Lipid Panel Lab Results  Component Value Date/Time   CHOL 145 08/20/2020 02:47 PM   TRIG 74 08/20/2020 02:47 PM   HDL 47 08/20/2020 02:47 PM   CHOLHDL 3.1 08/20/2020 02:47 PM   LDLCALC 83 08/20/2020 02:47 PM    Wt Readings from Last 3 Encounters:  11/18/21 175 lb (79.4 kg)  10/22/21 219 lb (99.3 kg)  09/18/21 223 lb 6.4 oz (101.3 kg)     Exam:    Vital Signs:  There were no vitals taken for this visit.    Physical Exam Constitutional:      General: She is not in acute distress.    Appearance: Normal appearance.  Pulmonary:     Effort: Pulmonary effort is normal. No respiratory distress.  Neurological:     General: No focal deficit present.     Mental Status: She is alert and oriented to person, place, and time. Mental status is at baseline.     Cranial Nerves: No cranial nerve deficit.  Psychiatric:        Mood and Affect: Mood and affect normal.        Behavior: Behavior normal.        Thought Content: Thought content normal.        Cognition and Memory: Memory normal.        Judgment: Judgment normal.    ASSESSMENT & PLAN:    1. Essential hypertension Blood pressure has been elevated, will add magnesium 200 mg daily and enoucouraged to check blood pressure 1-2 times a day unless significantly elevated or not feeling well. She is to send her blood pressure  readings via Mychart weekly. - Magnesium 200 MG TABS; Take 1 tablet (200 mg total) by mouth daily. Take with evening meal  Dispense: 30 tablet; Refill: 2  2. Feeling of chest tightness Follow up from ER visit. Tightness with taking deep breath, costochondritis vs pleuritis. CXR normal and EKG normal. Take Tylenol 2 times day for 5 days.   - Magnesium 200 MG TABS; Take 1 tablet (200 mg total) by mouth daily. Take with evening meal  Dispense: 30 tablet; Refill: 2     COVID-19 Education: The signs and symptoms of COVID-19 were discussed with the  patient and how to seek care for testing (follow up with PCP or arrange E-visit).  The importance of social distancing was discussed today.  Patient Risk:   After full review of this patients clinical status, I feel that they are at least moderate risk at this time.  Time:   Today, I have spent 13.57 minutes/ seconds with the patient with telehealth technology discussing above diagnoses.     Medication Adjustments/Labs and Tests Ordered: Current medicines are reviewed at length with the patient today.  Concerns regarding medicines are outlined above.   Tests Ordered: No orders of the defined types were placed in this encounter.   Medication Changes: Meds ordered this encounter  Medications   Magnesium 200 MG TABS    Sig: Take 1 tablet (200 mg total) by mouth daily. Take with evening meal    Dispense:  30 tablet    Refill:  2    Disposition:  Follow up prn  Signed, Minette Brine, FNP

## 2021-11-21 NOTE — Telephone Encounter (Signed)
Transition Care Management Unsuccessful Follow-up Telephone Call  Date of discharge and from where:  11/19/2021 - [facility]  Attempts:  2nd Attempt  Reason for unsuccessful TCM follow-up call:  Voice mail full

## 2021-11-22 NOTE — Telephone Encounter (Signed)
Transition Care Management Unsuccessful Follow-up Telephone Call  Date of discharge and from where:  11/19/2021 from [facility]  Attempts:  3rd Attempt  Reason for unsuccessful TCM follow-up call:  Unable to reach patient

## 2022-01-03 ENCOUNTER — Other Ambulatory Visit: Payer: Self-pay | Admitting: *Deleted

## 2022-01-03 DIAGNOSIS — Z3042 Encounter for surveillance of injectable contraceptive: Secondary | ICD-10-CM

## 2022-01-03 MED ORDER — MEDROXYPROGESTERONE ACETATE 150 MG/ML IM SUSP: 150.0000 mg | INTRAMUSCULAR | 0 refills | Status: DC

## 2022-01-03 NOTE — Progress Notes (Signed)
Depo reordered today- pt has injection appt next week. ?Pt will need AEX to continue Rx. ?

## 2022-01-07 ENCOUNTER — Telehealth: Payer: Self-pay

## 2022-01-07 NOTE — Telephone Encounter (Signed)
S/w pt and she stated that she cannot make depo appt, asked if nurse at her place of employment can administer. Advised pt to call or mychart [place] the injection information so that it can be documented. ?

## 2022-01-08 ENCOUNTER — Other Ambulatory Visit: Payer: Self-pay

## 2022-01-08 ENCOUNTER — Ambulatory Visit: Payer: PRIVATE HEALTH INSURANCE

## 2022-01-08 ENCOUNTER — Encounter: Payer: Self-pay | Admitting: Obstetrics & Gynecology

## 2022-01-08 NOTE — Progress Notes (Signed)
Pt sent mychart message stating that her depo was administered at work to her at work today at [employer] in the [employer]. Next depo is due June 7-21. ?

## 2022-01-14 ENCOUNTER — Telehealth: Payer: Self-pay

## 2022-01-14 ENCOUNTER — Encounter: Payer: Self-pay | Admitting: Nurse Practitioner

## 2022-01-14 ENCOUNTER — Encounter: Payer: PRIVATE HEALTH INSURANCE | Admitting: Nurse Practitioner

## 2022-01-14 NOTE — Patient Instructions (Signed)

## 2022-01-14 NOTE — Telephone Encounter (Signed)
I left the pt a message to call the office back.  I called her because she sent a mychart message that she needed to cancel her appt for today because she isn't feeling well and to offer her a virtual visit and to see if she wanted to come and get swabbed for covid.   ?

## 2022-01-14 NOTE — Progress Notes (Signed)
No show

## 2022-01-21 ENCOUNTER — Emergency Department (HOSPITAL_COMMUNITY)
Admission: EM | Admit: 2022-01-21 | Discharge: 2022-01-21 | Disposition: A | Discharge status: Home / Self Care | Payer: PRIVATE HEALTH INSURANCE | Attending: Emergency Medicine | Admitting: Emergency Medicine

## 2022-01-21 ENCOUNTER — Encounter (HOSPITAL_COMMUNITY): Payer: Self-pay | Admitting: Emergency Medicine

## 2022-01-21 DIAGNOSIS — K644 Residual hemorrhoidal skin tags: Secondary | ICD-10-CM | POA: Diagnosis not present

## 2022-01-21 DIAGNOSIS — K649 Unspecified hemorrhoids: Secondary | ICD-10-CM | POA: Diagnosis present

## 2022-01-21 MED ORDER — KETOROLAC TROMETHAMINE 60 MG/2ML IM SOLN
15.0000 mg | Freq: Once | INTRAMUSCULAR | Status: AC
  Administered 2022-01-21: 15 mg via INTRAMUSCULAR
  Filled 2022-01-21: qty 2

## 2022-01-21 MED ORDER — HYDROCORTISONE 1 % EX CREA: TOPICAL_CREAM | CUTANEOUS | 0 refills | Status: DC

## 2022-01-21 MED ORDER — OXYCODONE-ACETAMINOPHEN 5-325 MG PO TABS
1.0000 | ORAL_TABLET | Freq: Once | ORAL | Status: AC
  Administered 2022-01-21: 1 via ORAL
  Filled 2022-01-21: qty 1

## 2022-01-21 NOTE — ED Provider Notes (Signed)
?[facility] DEPT ?Provider Note ? ? ?CSN: [id number] ?Arrival date & time: 01/21/22  6160 ? ?  ? ?History ?Chief Complaint  ?Patient presents with  ? Hemorrhoids  ? ? ?[name] is a 31 y.o. female with h/o hemorrhoids presents to the ED for evaluation of hemorrhoids since last night. The patient reports that she is not constipated, but does not use the bathroom everyday which is typical for her.  She denies any melena or any hematochezia.  Denies any rectal bleeding.  Denies any dysuria, hematuria, melena, hematochezia, abdominal pain, nausea, vomiting, diarrhea, or hard stool.  She reports she had this problem all ago but self resolved.  She reports its come back.  She denies any trauma to the area. ? ?HPI ? ?  ? ?Home Medications ?Prior to Admission medications   ?Medication Sig Start Date End Date Taking? Authorizing Provider  ?amLODipine (NORVASC) 10 MG tablet TAKE 1 TABLET BY MOUTH EVERY DAY 09/30/21   Minette Brine, FNP  ?famotidine (PEPCID) 40 MG tablet Take 1 tablet (40 mg total) by mouth daily. 11/19/21   Tedd Sias, PA  ?ferrous sulfate 325 (65 FE) MG tablet TAKE 1 TABLET BY MOUTH EVERY DAY 10/21/21   Bary Castilla, NP  ?hydrochlorothiazide (HYDRODIURIL) 12.5 MG tablet Take 1 tablet (12.5 mg total) by mouth daily. 09/18/21   Bary Castilla, NP  ?ibuprofen (ADVIL) 800 MG tablet TAKE 1 TABLET BY MOUTH EVERY 8 HOURS AS NEEDED 10/18/21   Shelly Bombard, MD  ?Magnesium 200 MG TABS Take 1 tablet (200 mg total) by mouth daily. Take with evening meal 11/21/21   Minette Brine, FNP  ?medroxyPROGESTERone (DEPO-PROVERA) 150 MG/ML injection Inject 1 mL (150 mg total) into the muscle every 3 (three) months. 01/03/22   Shelly Bombard, MD  ?metFORMIN (GLUCOPHAGE) 500 MG tablet TAKE 1 TABLET BY MOUTH 2 TIMES DAILY WITH A MEAL. 09/30/21   Minette Brine, FNP  ?Vitamin D, Ergocalciferol, (DRISDOL) 1.25 MG (50000 UNIT) CAPS capsule Take 1 capsule (50,000 Units total) by mouth  every 7 (seven) days. 09/19/21   Bary Castilla, NP  ?   ? ?Allergies    ?Patient has no known allergies.   ? ?Review of Systems   ?Review of Systems  ?Constitutional:  Negative for fever.  ?Respiratory:  Negative for shortness of breath.   ?Cardiovascular:  Negative for chest pain.  ?Gastrointestinal:  Positive for rectal pain. Negative for abdominal pain, anal bleeding, blood in stool, constipation, diarrhea, nausea and vomiting.  ?Genitourinary:  Negative for dysuria and hematuria.  ?Neurological:  Negative for light-headedness.  ? ?Physical Exam ?Updated Vital Signs ?BP 138/76 (BP Location: Right Arm)   Pulse 76   Temp 98.2 ?F (36.8 ?C) (Oral)   Resp 16   Ht '5\' 9"'$  (1.753 m)   Wt 97.1 kg   SpO2 100%   BMI 31.60 kg/m?  ?Physical Exam ?Vitals and nursing note reviewed. Exam conducted with a chaperone present Estill Bamberg, [place]).  ?Constitutional:   ?   General: She is not in acute distress. ?   Appearance: Normal appearance. She is not ill-appearing or toxic-appearing.  ?Eyes:  ?   General: No scleral icterus. ?Pulmonary:  ?   Effort: Pulmonary effort is normal. No respiratory distress.  ?Abdominal:  ?   Palpations: Abdomen is soft.  ?   Tenderness: There is no abdominal tenderness. There is no guarding or rebound.  ?Genitourinary: ?   Rectum: External hemorrhoid present.  ?   Comments:  Large marble sized hemorrhoid to the 10-12 o'clock position. Smaller hemorrhoids to the 2-7 o'clock position. Soft, not thrombosed. No bleeding noted. No fissures noted. Chaperone present.  ?Skin: ?   General: Skin is dry.  ?   Findings: No rash.  ?Neurological:  ?   General: No focal deficit present.  ?   Mental Status: She is alert. Mental status is at baseline.  ?Psychiatric:     ?   Mood and Affect: Mood normal.  ? ? ?ED Results / Procedures / Treatments   ?Labs ?(all labs ordered are listed, but only abnormal results are displayed) ?Labs Reviewed - No data to display ? ?EKG ?None ? ?Radiology ?No results  found. ? ?Procedures ?Procedures  ? ?Medications Ordered in ED ?Medications  ?ketorolac (TORADOL) injection 15 mg (15 mg Intramuscular Given 01/21/22 0936)  ?oxyCODONE-acetaminophen (PERCOCET/ROXICET) 5-325 MG per tablet 1 tablet (1 tablet Oral Given 01/21/22 0937)  ? ? ?ED Course/ Medical Decision Making/ A&P ?  ?                        ?Medical Decision Making ?Risk ?Prescription drug management. ? ? ?31 year old female presents emerged department for evaluation of her hemorrhoids.  Differential diagnosis includes is not limited to external hemorrhoid, internal hemorrhoid, thrombosed hemorrhoid, anal fissures, rectal prolapse.Patient afebrile, normotensive, normal pulse rate, satting well room air without any increased work of breathing.  Physical exam is pertinent for large marble sized hemorrhoid to the 10-12 o'clock position. Smaller hemorrhoids to the 2-7 o'clock position. Soft, not thrombosed. No bleeding noted. No fissures noted. Chaperone present.  ? ?Will give patient Toradol and percocet as she has a ride home with her mother.  ? ?Based on her physical exam findings, will prescribe patient a topical steroid cream.  Recommended sitz bath's as well as topical lidocaine and witch hazel.  Gave the patient information to general surgery as she mostly would need to get these excised if they did not self resolve.  Recommended Tylenol or ibuprofen as needed for pain.  Return precautions given.  Patient agrees to plan.  Patient is stable being discharged home in good condition. ? ?I discussed this case with my attending physician who cosigned this note including patient's presenting symptoms, physical exam, and planned diagnostics and interventions. Attending physician stated agreement with plan or made changes to plan which were implemented.  ? ?Final Clinical Impression(s) / ED Diagnoses ?Final diagnoses:  ?External hemorrhoids  ? ? ?Rx / DC Orders ?ED Discharge Orders   ? ?      Ordered  ?  hydrocortisone cream 1 %        ? 01/21/22 0937  ? ?  ?  ? ?  ? ? ?  ?Sherrell Puller, PA-C ?01/21/22 6811 ? ?  ?Lacretia Leigh, MD ?01/24/22 [phone] ? ?

## 2022-01-21 NOTE — ED Notes (Signed)
I provided reinforced discharge education based off of discharge instructions. Pt acknowledged and understood my education. Pt had no further questions/concerns for provider/myself.  °

## 2022-01-21 NOTE — ED Triage Notes (Signed)
Per pt, states painful hemorrhoids-states she noticed last night-history of the same ?

## 2022-01-21 NOTE — Discharge Instructions (Signed)
You were seen here today for evaluation of your hemorrhoids. I have attached more information on sitz baths and hemorrhoids to the discharge paperwork. I am prescribing you a topical hydrocortisone cream to apply to the area. You can try other OTC treatments such as witch hazel and lidocaine spray. I have attached the information to a general surgeon that you will need to call and follow up with. Additionally, I have attached work note as well.  ? ?Contact a doctor if you: ?Have pain and swelling that do not get better with treatment or medicine. ?Have trouble pooping. ?Cannot poop. ?Have pain or swelling outside the area of the hemorrhoids. ?Get help right away if you have: ?Bleeding that will not stop. ?

## 2022-01-22 ENCOUNTER — Telehealth: Payer: Self-pay

## 2022-01-22 NOTE — Telephone Encounter (Signed)
Transition Care Management Follow-up Telephone Call ?Date of discharge and from where: 01/21/2022 from [facility] ?How have you been since you were released from the hospital? Patient stated that she is feeling well. No questions or concerns at this time.  ?Any questions or concerns? No ? ?Items Reviewed: ?Did the pt receive and understand the discharge instructions provided? Yes  ?Medications obtained and verified? Yes  ?Other? No  ?Any new allergies since your discharge? No  ?Dietary orders reviewed? No ?Do you have support at home? Yes  ? ?Functional Questionnaire: (I = Independent and D = Dependent) ?ADLs: I ? ?Bathing/Dressing- I ? ?Meal Prep- I ? ?Eating- I ? ?Maintaining continence- I ? ?Transferring/Ambulation- I ? ?Managing Meds- I ? ? ?Follow up appointments reviewed: ? ?PCP Hospital f/u appt confirmed? No   ?Specialist Hospital f/u appt confirmed? Yes  Not able to schedule with Gen Surg b/c a referral is needed. Informed that PCP could place a referral, but may need an appt.  ?Are transportation arrangements needed? No  ?If their condition worsens, is the pt aware to call PCP or go to the Emergency Dept.? Yes ?Was the patient provided with contact information for the PCP's office or ED? Yes ?Was to pt encouraged to call back with questions or concerns? Yes ? ?

## 2022-03-23 ENCOUNTER — Other Ambulatory Visit: Payer: Self-pay | Admitting: Obstetrics

## 2022-03-23 DIAGNOSIS — Z3042 Encounter for surveillance of injectable contraceptive: Secondary | ICD-10-CM

## 2022-03-27 ENCOUNTER — Ambulatory Visit: Payer: PRIVATE HEALTH INSURANCE

## 2022-04-03 ENCOUNTER — Other Ambulatory Visit: Payer: Self-pay

## 2022-04-03 ENCOUNTER — Emergency Department (HOSPITAL_BASED_OUTPATIENT_CLINIC_OR_DEPARTMENT_OTHER)
Admission: EM | Admit: 2022-04-03 | Discharge: 2022-04-03 | Disposition: A | Discharge status: Home / Self Care | Payer: PRIVATE HEALTH INSURANCE | Attending: Emergency Medicine | Admitting: Emergency Medicine

## 2022-04-03 ENCOUNTER — Encounter (HOSPITAL_BASED_OUTPATIENT_CLINIC_OR_DEPARTMENT_OTHER): Payer: Self-pay | Admitting: Emergency Medicine

## 2022-04-03 DIAGNOSIS — I160 Hypertensive urgency: Secondary | ICD-10-CM

## 2022-04-03 DIAGNOSIS — Z79899 Other long term (current) drug therapy: Secondary | ICD-10-CM | POA: Insufficient documentation

## 2022-04-03 DIAGNOSIS — Z7984 Long term (current) use of oral hypoglycemic drugs: Secondary | ICD-10-CM | POA: Diagnosis not present

## 2022-04-03 DIAGNOSIS — R519 Headache, unspecified: Secondary | ICD-10-CM | POA: Diagnosis present

## 2022-04-03 NOTE — Discharge Instructions (Addendum)
If you develop new or worsening headache, vision changes, chest pain, shortness of breath, or any other new/concerning symptoms then return to the ER for evaluation.

## 2022-04-03 NOTE — ED Provider Notes (Signed)
[facility] EMERGENCY DEPT Provider Note   CSN: [id number] Arrival date & time: 04/03/22  1250     History  Chief Complaint  Patient presents with   Hypertension    [name] is a 31 y.o. female.  HPI 31 year old female presents with hypertension.  She was at work and took her medicines which includes amlodipine and metformin and shortly thereafter developed blurry vision and a moderate left-sided headache.  This has been on and off shortly after taking these 2 meds for the last month or so.  Happens about every other day.  Often when she checks her blood pressure will be around 517 systolic.  Today was 170 and so staff at the work got concerned and told her to go to the ER.  It is currently resolved.  She feels fine.  Never had any chest pain.  She has migraines and this headache was nowhere near where her migraines ago. No double vision.  Home Medications Prior to Admission medications   Medication Sig Start Date End Date Taking? Authorizing Provider  amLODipine (NORVASC) 10 MG tablet TAKE 1 TABLET BY MOUTH EVERY DAY 09/30/21   Minette Brine, FNP  famotidine (PEPCID) 40 MG tablet Take 1 tablet (40 mg total) by mouth daily. 11/19/21   Tedd Sias, PA  ferrous sulfate 325 (65 FE) MG tablet TAKE 1 TABLET BY MOUTH EVERY DAY 10/21/21   Ghumman, Ramandeep, NP  hydrochlorothiazide (HYDRODIURIL) 12.5 MG tablet Take 1 tablet (12.5 mg total) by mouth daily. 09/18/21   Bary Castilla, NP  hydrocortisone cream 1 % Apply to affected area 2 times daily 01/21/22   Sherrell Puller, PA-C  ibuprofen (ADVIL) 800 MG tablet TAKE 1 TABLET BY MOUTH EVERY 8 HOURS AS NEEDED 03/24/22   Shelly Bombard, MD  Magnesium 200 MG TABS Take 1 tablet (200 mg total) by mouth daily. Take with evening meal 11/21/21   Minette Brine, FNP  medroxyPROGESTERone (DEPO-PROVERA) 150 MG/ML injection INJECT 1 ML (150 MG TOTAL) INTO THE MUSCLE EVERY 3 (THREE) MONTHS 03/24/22   Shelly Bombard, MD  metFORMIN  (GLUCOPHAGE) 500 MG tablet TAKE 1 TABLET BY MOUTH 2 TIMES DAILY WITH A MEAL. 09/30/21   Minette Brine, FNP  Vitamin D, Ergocalciferol, (DRISDOL) 1.25 MG (50000 UNIT) CAPS capsule Take 1 capsule (50,000 Units total) by mouth every 7 (seven) days. 09/19/21   Bary Castilla, NP      Allergies    Patient has no known allergies.    Review of Systems   Review of Systems  Eyes:  Positive for visual disturbance.  Cardiovascular:  Negative for chest pain.  Neurological:  Positive for headaches. Negative for weakness.    Physical Exam Updated Vital Signs BP 126/86   Pulse 91   Temp 99 F (37.2 C) (Oral)   Resp 16   Ht '5\' 9"'$  (1.753 m)   SpO2 99%   BMI 31.60 kg/m  Physical Exam Vitals and nursing note reviewed.  Constitutional:      General: She is not in acute distress.    Appearance: She is well-developed. She is not ill-appearing or diaphoretic.  HENT:     Head: Normocephalic and atraumatic.  Cardiovascular:     Rate and Rhythm: Normal rate and regular rhythm.     Heart sounds: Normal heart sounds.  Pulmonary:     Effort: Pulmonary effort is normal.     Breath sounds: Normal breath sounds.  Abdominal:     General: There is no distension.  Skin:    General: Skin is warm and dry.  Neurological:     Mental Status: She is alert.     Comments: CN 3-12 grossly intact. 5/5 strength in all 4 extremities. Grossly normal sensation. Normal finger to nose.      ED Results / Procedures / Treatments   Labs (all labs ordered are listed, but only abnormal results are displayed) Labs Reviewed - No data to display  EKG None  Radiology No results found.  Procedures Procedures    Medications Ordered in ED Medications - No data to display  ED Course/ Medical Decision Making/ A&P                           Medical Decision Making  Patient is asymptomatic at this time.  Her vital signs are normal including her blood pressure on multiple checks.  Offered to do some blood work  but she declines.  This is pretty reasonable as this has been an on and off problem for quite some time.  However otherwise she is not been having headaches and I do not think an emergent head CT would be warranted.  No cardiac symptoms.  At this point, I think she can follow-up with PCP to discuss potential medicine changes and was given return precautions.        Final Clinical Impression(s) / ED Diagnoses Final diagnoses:  Hypertensive urgency    Rx / DC Orders ED Discharge Orders     None         Sherwood Gambler, MD 04/03/22 1637

## 2022-04-03 NOTE — ED Triage Notes (Signed)
Patient presents to ED via POV from work. Patient reports HTN. Endorses headache.

## 2022-04-04 ENCOUNTER — Telehealth: Payer: Self-pay

## 2022-04-04 NOTE — Telephone Encounter (Signed)
Transition Care Management Unsuccessful Follow-up Telephone Call  Date of discharge and from where:  04/04/2022 from [facility]  Attempts:  1st Attempt  Reason for unsuccessful TCM follow-up call:  Left voice message

## 2022-04-07 ENCOUNTER — Telehealth: Payer: Self-pay

## 2022-04-07 NOTE — Telephone Encounter (Signed)
Transition Care Management Unsuccessful Follow-up Telephone Call  Date of discharge and from where:  04/03/2022 [facility]   Attempts:  1st Attempt  Reason for unsuccessful TCM follow-up call:  Left voice message

## 2022-04-07 NOTE — Telephone Encounter (Signed)
Transition Care Management Unsuccessful Follow-up Telephone Call  Date of discharge and from where:  04/04/2022 from [facility]  Attempts:  2nd Attempt  Reason for unsuccessful TCM follow-up call:  Left voice message

## 2022-04-07 NOTE — Telephone Encounter (Signed)
Error

## 2022-04-08 ENCOUNTER — Telehealth: Payer: Self-pay

## 2022-04-08 NOTE — Telephone Encounter (Signed)
Transition Care Management Unsuccessful Follow-up Telephone Call  Date of discharge and from where:  04/04/2022 from [facility]    Attempts:  3rd Attempt  Reason for unsuccessful TCM follow-up call:  Unable to reach patient

## 2022-04-08 NOTE — Telephone Encounter (Signed)
Error

## 2022-05-25 ENCOUNTER — Encounter (HOSPITAL_BASED_OUTPATIENT_CLINIC_OR_DEPARTMENT_OTHER): Payer: Self-pay

## 2022-05-25 ENCOUNTER — Emergency Department (HOSPITAL_BASED_OUTPATIENT_CLINIC_OR_DEPARTMENT_OTHER): Payer: PRIVATE HEALTH INSURANCE

## 2022-05-25 ENCOUNTER — Emergency Department (HOSPITAL_BASED_OUTPATIENT_CLINIC_OR_DEPARTMENT_OTHER)
Admission: EM | Admit: 2022-05-25 | Discharge: 2022-05-25 | Disposition: A | Discharge status: Home / Self Care | Payer: PRIVATE HEALTH INSURANCE | Attending: Emergency Medicine | Admitting: Emergency Medicine

## 2022-05-25 ENCOUNTER — Other Ambulatory Visit: Payer: Self-pay

## 2022-05-25 DIAGNOSIS — I10 Essential (primary) hypertension: Secondary | ICD-10-CM | POA: Insufficient documentation

## 2022-05-25 DIAGNOSIS — R079 Chest pain, unspecified: Secondary | ICD-10-CM | POA: Diagnosis not present

## 2022-05-25 DIAGNOSIS — Z79899 Other long term (current) drug therapy: Secondary | ICD-10-CM | POA: Insufficient documentation

## 2022-05-25 LAB — CBC
HCT: 40.9 % (ref 36.0–46.0)
Hemoglobin: 13.1 g/dL (ref 12.0–15.0)
MCH: 23.5 pg — ABNORMAL LOW (ref 26.0–34.0)
MCHC: 32 g/dL (ref 30.0–36.0)
MCV: 73.3 fL — ABNORMAL LOW (ref 80.0–100.0)
Platelets: 131 10*3/uL — ABNORMAL LOW (ref 150–400)
RBC: 5.58 MIL/uL — ABNORMAL HIGH (ref 3.87–5.11)
RDW: 14.4 % (ref 11.5–15.5)
WBC: 5.3 10*3/uL (ref 4.0–10.5)
nRBC: 0 % (ref 0.0–0.2)

## 2022-05-25 LAB — BASIC METABOLIC PANEL
Anion gap: 10 (ref 5–15)
BUN: 9 mg/dL (ref 6–20)
CO2: 25 mmol/L (ref 22–32)
Calcium: 9.7 mg/dL (ref 8.9–10.3)
Chloride: 106 mmol/L (ref 98–111)
Creatinine, Ser: 0.87 mg/dL (ref 0.44–1.00)
GFR, Estimated: >60 mL/min (ref >60)
Glucose, Bld: 103 mg/dL — ABNORMAL HIGH (ref 70–99)
Potassium: 3.9 mmol/L (ref 3.5–5.1)
Sodium: 141 mmol/L (ref 135–145)

## 2022-05-25 LAB — TROPONIN I (HIGH SENSITIVITY)
Troponin I (High Sensitivity): <2 ng/L (ref <18)
Troponin I (High Sensitivity): <2 ng/L (ref <18)

## 2022-05-25 MED ORDER — KETOROLAC TROMETHAMINE 30 MG/ML IJ SOLN
30.0000 mg | Freq: Once | INTRAMUSCULAR | Status: AC
  Administered 2022-05-25: 30 mg via INTRAVENOUS
  Filled 2022-05-25: qty 1

## 2022-05-25 MED ORDER — ASPIRIN 81 MG PO CHEW
324.0000 mg | CHEWABLE_TABLET | Freq: Once | ORAL | Status: AC
  Administered 2022-05-25: 324 mg via ORAL
  Filled 2022-05-25: qty 4

## 2022-05-25 MED ORDER — NITROGLYCERIN 0.4 MG SL SUBL
0.4000 mg | SUBLINGUAL_TABLET | SUBLINGUAL | Status: DC | PRN
Start: 2022-05-25 — End: 2022-05-25
  Administered 2022-05-25 (×2): 0.4 mg via SUBLINGUAL
  Filled 2022-05-25: qty 1

## 2022-05-25 MED ORDER — ACETAMINOPHEN 500 MG PO TABS
1000.0000 mg | ORAL_TABLET | Freq: Once | ORAL | Status: AC
  Administered 2022-05-25: 1000 mg via ORAL
  Filled 2022-05-25: qty 2

## 2022-05-25 MED ORDER — CYCLOBENZAPRINE HCL 10 MG PO TABS: 10.0000 mg | ORAL_TABLET | Freq: Two times a day (BID) | ORAL | 0 refills | Status: DC | PRN

## 2022-05-25 NOTE — ED Provider Notes (Signed)
[facility] EMERGENCY DEPT Provider Note   CSN: [id number] Arrival date & time: 05/25/22  0710     History  Chief Complaint  Patient presents with   Chest Pain    [name] is a 31 y.o. female.  HPI    31yo female with history of hypertension, prediabetes, fatigue presents with concern for chest pain.   Pain started Thursday while at desk at work. Pain persistent yesterday and worse with turning, twisting, moving, talking makes it worse.  Tightness, wants to keep trying to stretch to loosen it up 10/10, moving and talking No shortness of breath, no nausea, no sweating, no lightheadedness Radiating a little bit towards neck but otherwise no radiation  No cough, fever, abdominal pain, numbness/weakness, leg pain or swelling  Hx of htn (no chol, no DM), hx nonepileptic seizures Grandfather had heart disease, no immediate family No smoking, other drugs, occ etoh  Depo shot. 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 Authorizing Provider  cyclobenzaprine (FLEXERIL) 10 MG tablet Take 1 tablet (10 mg total) by mouth 2 (two) times daily as needed for muscle spasms. 05/25/22  Yes Gareth Morgan, MD  amLODipine (NORVASC) 10 MG tablet TAKE 1 TABLET BY MOUTH EVERY DAY 09/30/21   Minette Brine, FNP  famotidine (PEPCID) 40 MG tablet Take 1 tablet (40 mg total) by mouth daily. 11/19/21   Tedd Sias, PA  ferrous sulfate 325 (65 FE) MG tablet TAKE 1 TABLET BY MOUTH EVERY DAY 10/21/21   Ghumman, Ramandeep, NP  hydrochlorothiazide (HYDRODIURIL) 12.5 MG tablet Take 1 tablet (12.5 mg total) by mouth daily. 09/18/21   Bary Castilla, NP  hydrocortisone cream 1 %  Apply to affected area 2 times daily 01/21/22   Sherrell Puller, PA-C  ibuprofen (ADVIL) 800 MG tablet TAKE 1 TABLET BY MOUTH EVERY 8 HOURS AS NEEDED 03/24/22   Shelly Bombard, MD  Magnesium 200 MG TABS Take 1 tablet (200 mg total) by mouth daily. Take with evening meal 11/21/21   Minette Brine, FNP  medroxyPROGESTERone (DEPO-PROVERA) 150 MG/ML injection INJECT 1 ML (150 MG TOTAL) INTO THE MUSCLE EVERY 3 (THREE) MONTHS 03/24/22   Shelly Bombard, MD  metFORMIN (GLUCOPHAGE) 500 MG tablet TAKE 1 TABLET BY MOUTH 2 TIMES DAILY WITH A MEAL. 09/30/21   Minette Brine, FNP  Vitamin D, Ergocalciferol, (DRISDOL) 1.25 MG (50000 UNIT) CAPS capsule Take 1 capsule (50,000 Units total) by mouth every 7 (seven) days. 09/19/21   Bary Castilla, NP      Allergies    Patient has no known allergies.    Review of Systems   Review of Systems  Physical Exam Updated Vital Signs BP 105/62   Pulse 69   Temp 98.9 F (37.2 C) (Oral)   Resp 17   Ht '5\' 9"'$  (1.753 m)   Wt 99.8 kg   SpO2 99%   BMI 32.49 kg/m  Physical Exam Vitals and nursing note reviewed.  Constitutional:      General: She is not in acute distress.    Appearance: She is well-developed. She is not diaphoretic.  HENT:     Head: Normocephalic and atraumatic.  Eyes:  Conjunctiva/sclera: Conjunctivae normal.  Cardiovascular:     Rate and Rhythm: Normal rate and regular rhythm.     Heart sounds: Normal heart sounds. No murmur heard.    No friction rub. No gallop.  Pulmonary:     Effort: Pulmonary effort is normal. No respiratory distress.     Breath sounds: Normal breath sounds. No wheezing or rales.  Abdominal:     General: There is no distension.     Palpations: Abdomen is soft.     Tenderness: There is no abdominal tenderness. There is no guarding.  Musculoskeletal:        General: No tenderness.     Cervical back: Normal range of motion.  Skin:    General: Skin is warm and dry.     Findings: No erythema or rash.  Neurological:      Mental Status: She is alert and oriented to person, place, and time.     ED Results / Procedures / Treatments   Labs (all labs ordered are listed, but only abnormal results are displayed) Labs Reviewed  BASIC METABOLIC PANEL - Abnormal; Notable for the following components:      Result Value   Glucose, Bld 103 (*)    All other components within normal limits  CBC - Abnormal; Notable for the following components:   RBC 5.58 (*)    MCV 73.3 (*)    MCH 23.5 (*)    Platelets 131 (*)    All other components within normal limits  TROPONIN I (HIGH SENSITIVITY)  TROPONIN I (HIGH SENSITIVITY)    EKG EKG Interpretation  Date/Time:  'Sunday May 25 2022 07:20:33 EDT Ventricular Rate:  93 PR Interval:  160 QRS Duration: 87 QT Interval:  355 QTC Calculation: 442 R Axis:   149 Text Interpretation: Sinus rhythm Probable lateral infarct, age indeterminate New TW changes lateral Confirmed by Sindy Mccune ([id number]) on 05/25/2022 7:28:21 AM  Radiology DG Chest Portable 1 View  Result Date: 05/25/2022 CLINICAL DATA:  Chest pain. EXAM: PORTABLE CHEST 1 VIEW COMPARISON:  11/18/2021 FINDINGS: Heart size and mediastinal contours are unremarkable. No pleural effusion or edema identified. No airspace opacities. Visualized osseous structures appear intact. IMPRESSION: No active disease. Electronically Signed   By: Taylor  Stroud M.D.   On: 05/25/2022 08:32    Procedures Procedures    Medications Ordered in ED Medications  aspirin chewable tablet 324 mg (324 mg Oral Given 05/25/22 0819)  acetaminophen (TYLENOL) tablet 1,000 mg (1,000 mg Oral Given 05/25/22 0908)  ketorolac (TORADOL) 30 MG/ML injection 30 mg (30 mg Intravenous Given 05/25/22 1106)    ED Course/ Medical Decision Making/ A&P                            30'$ yo female with history of hypertension, prediabetes, fatigue presents with concern for chest pain.    Differential diagnosis for chest pain includes pulmonary embolus, dissection,  pneumothorax, pneumonia, ACS, myocarditis, pericarditis.  EKG was done and evaluate by me and showed no ST elevation and no signs of pericarditis-does show TW changes laterally. Chest x-ray was done and evaluated by me and radiology and showed no sign of pneumonia or pneumothorax.   Low suspicion for PE given no dyspnea, no asymmetric leg swelling, no hypoxia, pain worse with small movements and palpation.  Patient is low risk HEART score and had delta troponins which were both negative.  Do not feel history or exam are consistent with aortic dissection.  Other labs reviewed and interpreted by me and show no significant anemia or electrolyte abnormality.  Pain is worse with movement and palpation, consider muscular pain.  Patient discharged in stable condition with understanding of reasons to return.         Final Clinical Impression(s) / ED Diagnoses Final diagnoses:  Chest pain, unspecified type    Rx / DC Orders ED Discharge Orders          Ordered    cyclobenzaprine (FLEXERIL) 10 MG tablet  2 times daily PRN        05/25/22 1028              Gareth Morgan, MD 05/25/22 2331

## 2022-05-25 NOTE — ED Triage Notes (Signed)
Pt presents POV from home with chest pain x4 days. Pt reports it feels like it needs to "pop", pt reports starting yesterday she has pain with lifting her arm, talking and twisting. Pt feels it's more muscular pain. No jaw pain, SOB, N/V, or dizziness.   Pt works in [employer], she was sitting at her desk when this started on Thursday

## 2022-06-19 ENCOUNTER — Ambulatory Visit: Payer: PRIVATE HEALTH INSURANCE

## 2022-09-05 ENCOUNTER — Telehealth: Payer: Self-pay | Admitting: Emergency Medicine

## 2022-09-05 NOTE — Telephone Encounter (Signed)
RC to patient to answer questions about bleeding on DEPO

## 2022-09-17 ENCOUNTER — Other Ambulatory Visit: Payer: Self-pay | Admitting: Emergency Medicine

## 2022-09-17 MED ORDER — MEDROXYPROGESTERONE ACETATE 150 MG/ML IM SUSP
150.0000 mg | Freq: Once | INTRAMUSCULAR | Status: AC
  Administered 2023-11-09: 150 mg via INTRAMUSCULAR

## 2022-09-17 NOTE — Progress Notes (Signed)
Rx for Depo to new pharmacy for insurance coverage.

## 2022-10-14 ENCOUNTER — Other Ambulatory Visit: Payer: Self-pay

## 2022-10-14 ENCOUNTER — Emergency Department (HOSPITAL_BASED_OUTPATIENT_CLINIC_OR_DEPARTMENT_OTHER)
Admission: EM | Admit: 2022-10-14 | Discharge: 2022-10-14 | Disposition: A | Discharge status: Home / Self Care | Payer: PRIVATE HEALTH INSURANCE | Attending: Emergency Medicine | Admitting: Emergency Medicine

## 2022-10-14 ENCOUNTER — Encounter (HOSPITAL_BASED_OUTPATIENT_CLINIC_OR_DEPARTMENT_OTHER): Payer: Self-pay | Admitting: Emergency Medicine

## 2022-10-14 DIAGNOSIS — Z20822 Contact with and (suspected) exposure to covid-19: Secondary | ICD-10-CM | POA: Diagnosis not present

## 2022-10-14 DIAGNOSIS — J069 Acute upper respiratory infection, unspecified: Secondary | ICD-10-CM | POA: Diagnosis not present

## 2022-10-14 DIAGNOSIS — R112 Nausea with vomiting, unspecified: Secondary | ICD-10-CM | POA: Diagnosis present

## 2022-10-14 LAB — RESP PANEL BY RT-PCR (RSV, FLU A&B, COVID)  RVPGX2
Influenza A by PCR: NEGATIVE
Influenza B by PCR: NEGATIVE
Resp Syncytial Virus by PCR: NEGATIVE
SARS Coronavirus 2 by RT PCR: NEGATIVE

## 2022-10-14 MED ORDER — ONDANSETRON 4 MG PO TBDP: 4.0000 mg | ORAL_TABLET | Freq: Once | ORAL | Status: DC

## 2022-10-14 MED ORDER — ONDANSETRON HCL 4 MG PO TABS: 4.0000 mg | ORAL_TABLET | Freq: Four times a day (QID) | ORAL | 0 refills | Status: DC

## 2022-10-14 NOTE — ED Triage Notes (Signed)
Patient arrives ambulatory c/o emesis, diarrhea, fatigue, and headache onset of waking up this morning. Patient states daughter just tested positive for flu.

## 2022-10-14 NOTE — ED Provider Notes (Signed)
[facility] EMERGENCY DEPT Provider Note   CSN: [id number] Arrival date & time: 10/14/22  0841     History  Chief Complaint  Patient presents with   Generalized Body Aches    [name] is a 31 y.o. female presenting with viral symptoms since this morning.  She reports that her daughter tested positive for the flu yesterday and this morning she started to have nausea, vomiting, chills, body aches and overall fatigue  HPI     Home Medications Prior to Admission medications   Medication Sig Start Date End Date Taking? Authorizing Provider  ondansetron (ZOFRAN) 4 MG tablet Take 1 tablet (4 mg total) by mouth every 6 (six) hours. 10/14/22  Yes Dalasia Predmore A, PA-C  amLODipine (NORVASC) 10 MG tablet TAKE 1 TABLET BY MOUTH EVERY DAY 09/30/21   Minette Brine, FNP  cyclobenzaprine (FLEXERIL) 10 MG tablet Take 1 tablet (10 mg total) by mouth 2 (two) times daily as needed for muscle spasms. 05/25/22   Gareth Morgan, MD  famotidine (PEPCID) 40 MG tablet Take 1 tablet (40 mg total) by mouth daily. 11/19/21   Tedd Sias, PA  ferrous sulfate 325 (65 FE) MG tablet TAKE 1 TABLET BY MOUTH EVERY DAY 10/21/21   Ghumman, Ramandeep, NP  hydrochlorothiazide (HYDRODIURIL) 12.5 MG tablet Take 1 tablet (12.5 mg total) by mouth daily. 09/18/21   Bary Castilla, NP  hydrocortisone cream 1 % Apply to affected area 2 times daily 01/21/22   Sherrell Puller, PA-C  ibuprofen (ADVIL) 800 MG tablet TAKE 1 TABLET BY MOUTH EVERY 8 HOURS AS NEEDED 03/24/22   Shelly Bombard, MD  Magnesium 200 MG TABS Take 1 tablet (200 mg total) by mouth daily. Take with evening meal 11/21/21   Minette Brine, FNP  medroxyPROGESTERone (DEPO-PROVERA) 150 MG/ML injection INJECT 1 ML (150 MG TOTAL) INTO THE MUSCLE EVERY 3 (THREE) MONTHS 03/24/22   Shelly Bombard, MD  metFORMIN (GLUCOPHAGE) 500 MG tablet TAKE 1 TABLET BY MOUTH 2 TIMES DAILY WITH A MEAL. 09/30/21   Minette Brine, FNP  Vitamin D, Ergocalciferol,  (DRISDOL) 1.25 MG (50000 UNIT) CAPS capsule Take 1 capsule (50,000 Units total) by mouth every 7 (seven) days. 09/19/21   Bary Castilla, NP      Allergies    Patient has no known allergies.    Review of Systems   Review of Systems  Physical Exam Updated Vital Signs BP (!) 115/93   Pulse (!) 105   Temp 99.5 F (37.5 C)   Resp 18   Ht '5\' 9"'$  (1.753 m)   Wt 101.2 kg   SpO2 100%   BMI 32.93 kg/m  Physical Exam Vitals and nursing note reviewed.  Constitutional:      Appearance: Normal appearance.  HENT:     Head: Normocephalic and atraumatic.     Mouth/Throat:     Mouth: Mucous membranes are moist.     Pharynx: Oropharynx is clear. No oropharyngeal exudate or posterior oropharyngeal erythema.  Eyes:     General: No scleral icterus.    Conjunctiva/sclera: Conjunctivae normal.  Cardiovascular:     Rate and Rhythm: Normal rate and regular rhythm.  Pulmonary:     Effort: Pulmonary effort is normal. No respiratory distress.     Breath sounds: No wheezing.  Skin:    Findings: No rash.  Neurological:     Mental Status: She is alert.  Psychiatric:        Mood and Affect: Mood normal.  ED Results / Procedures / Treatments   Labs (all labs ordered are listed, but only abnormal results are displayed) Labs Reviewed  RESP PANEL BY RT-PCR (RSV, FLU A&B, COVID)  [id number]    EKG None  Radiology No results found.  Procedures Procedures   Medications Ordered in ED Medications - No data to display  ED Course/ Medical Decision Making/ A&P                           Medical Decision Making Risk Prescription drug management.   31 year old presenting today with URI symptoms.  Symptoms have been going since this morning.  On physical exam they are well-appearing, lung sounds clear.  Patient does not want to wait for her swab results and will follow-up on her portal.  I suspect she will test positive for the flu.  We discussed that this is something that needs to run  its course and they may use ibuprofen, Tylenol, DayQuil/NyQuil and other over-the-counter medications for their symptoms.  Return precautions discussed and they understand that they may follow-up on their symptoms outpatient with either PCP or urgent care as needed for nonemergent symptoms.  Agreeable to discharge at this time.  Zofran sent to her pharmacy for nausea    Final Clinical Impression(s) / ED Diagnoses Final diagnoses:  None    Rx / DC Orders ED Discharge Orders          Ordered    ondansetron (ZOFRAN) 4 MG tablet  Every 6 hours        10/14/22 0945           Results and diagnoses were explained to the patient. Return precautions discussed in full. Patient had no additional questions and expressed complete understanding.   This chart was dictated using voice recognition software.  Despite best efforts to proofread,  errors can occur which can change the documentation meaning.    Darliss Ridgel 10/14/22 0600    Gareth Morgan, MD 10/14/22 [phone]

## 2022-10-14 NOTE — Discharge Instructions (Addendum)
You came to the department today due to flulike symptoms.  You opted to follow-up on your results online.  Use over-the-counter medications for your symptoms however I did send Zofran for your nausea.  Other options include DayQuil/NyQuil, Mucinex for congestion, Robitussin/Delsym for cough and any other over-the-counter medications.  It is very important that you stay hydrated during this time as well.  You may use things like Powerade, Gatorade, electrolyte powders and water.  We hope that you feel better and do not hesitate to return to the emergency department with any worsening symptoms, especially chest pain, shortness of breath, dizziness and loss of consciousness.

## 2022-11-02 ENCOUNTER — Other Ambulatory Visit: Payer: Self-pay

## 2022-11-02 ENCOUNTER — Emergency Department (HOSPITAL_BASED_OUTPATIENT_CLINIC_OR_DEPARTMENT_OTHER)
Admission: EM | Admit: 2022-11-02 | Discharge: 2022-11-03 | Disposition: A | Discharge status: Home / Self Care | Payer: PRIVATE HEALTH INSURANCE | Attending: Emergency Medicine | Admitting: Emergency Medicine

## 2022-11-02 DIAGNOSIS — L03011 Cellulitis of right finger: Secondary | ICD-10-CM

## 2022-11-02 DIAGNOSIS — M7989 Other specified soft tissue disorders: Secondary | ICD-10-CM | POA: Diagnosis present

## 2022-11-02 NOTE — ED Triage Notes (Signed)
POV from home, c/o right pinky pain x couple days, took nail off tonight and it was swollen and red, pt sts that she does not remember injuring the finger. NAD, amb to triage, A&O x 4.

## 2022-11-02 NOTE — ED Provider Notes (Signed)
[facility] Provider Note: Georgena Spurling, MD, FACEP  CSN: [id number] MRN: [id number] ARRIVAL: 11/02/22 at [place]: [id number]   CHIEF COMPLAINT  Finger Swelling   HISTORY OF PRESENT ILLNESS  11/02/22 11:59 PM [name] is a 32 y.o. female with pain and swelling of her right fifth finger for the last couple of days.  She took the acrylic nail off tonight which did not improve her symptoms.  The pain and swelling are primarily involving the pad of the finger but she feels pain radiating approximately and the pain is worse with palpation or movement.  She rates the pain as a 10 out of 10.    Past Medical History:  Diagnosis Date   Convulsion, non-epileptic [facility])    Vaginal Pap smear, abnormal     Past Surgical History:  Procedure Laterality Date   MASS EXCISION N/A 09/13/2013   Procedure: EXCISION OF SOFT TISSUE MASS ON BACK;  Surgeon: Irene Limbo, MD;  Location: [place];  Service: Plastics;  Laterality: N/A;   none     WISDOM TOOTH EXTRACTION      Family History  Problem Relation Age of Onset   Heart failure Mother    Hypertension Mother    Heart failure Father    Hypertension Father     Social History   Tobacco Use   Smoking status: Never   Smokeless tobacco: Never  Vaping Use   Vaping Use: Never used  Substance Use Topics   Alcohol use: Yes    Alcohol/week: 4.0 standard drinks of alcohol    Types: 4 Shots of liquor per week   Drug use: No    Prior to Admission medications   Medication Sig Start Date End Date Taking? Authorizing Provider  cephALEXin (KEFLEX) 500 MG capsule Take 1 capsule (500 mg total) by mouth 4 (four) times daily. 11/03/22  Yes Zeric Baranowski, MD  fluconazole (DIFLUCAN) 150 MG tablet Take 1 tablet as needed for vaginal yeast infection.  May repeat in 3 days if symptoms persist. 11/03/22  Yes Jontae Adebayo, MD  amLODipine (NORVASC) 10 MG tablet TAKE 1 TABLET BY MOUTH EVERY DAY 09/30/21   Minette Brine, FNP   cyclobenzaprine (FLEXERIL) 10 MG tablet Take 1 tablet (10 mg total) by mouth 2 (two) times daily as needed for muscle spasms. 05/25/22   Gareth Morgan, MD  famotidine (PEPCID) 40 MG tablet Take 1 tablet (40 mg total) by mouth daily. 11/19/21   Tedd Sias, PA  ferrous sulfate 325 (65 FE) MG tablet TAKE 1 TABLET BY MOUTH EVERY DAY 10/21/21   Ghumman, Ramandeep, NP  hydrochlorothiazide (HYDRODIURIL) 12.5 MG tablet Take 1 tablet (12.5 mg total) by mouth daily. 09/18/21   Bary Castilla, NP  hydrocortisone cream 1 % Apply to affected area 2 times daily 01/21/22   Sherrell Puller, PA-C  ibuprofen (ADVIL) 800 MG tablet TAKE 1 TABLET BY MOUTH EVERY 8 HOURS AS NEEDED 03/24/22   Shelly Bombard, MD  Magnesium 200 MG TABS Take 1 tablet (200 mg total) by mouth daily. Take with evening meal 11/21/21   Minette Brine, FNP  medroxyPROGESTERone (DEPO-PROVERA) 150 MG/ML injection INJECT 1 ML (150 MG TOTAL) INTO THE MUSCLE EVERY 3 (THREE) MONTHS 03/24/22   Shelly Bombard, MD  metFORMIN (GLUCOPHAGE) 500 MG tablet TAKE 1 TABLET BY MOUTH 2 TIMES DAILY WITH A MEAL. 09/30/21   Minette Brine, FNP  ondansetron (ZOFRAN) 4 MG tablet Take 1 tablet (4 mg total) by mouth every 6 (  six) hours. 10/14/22   Redwine, Madison A, PA-C  Vitamin D, Ergocalciferol, (DRISDOL) 1.25 MG (50000 UNIT) CAPS capsule Take 1 capsule (50,000 Units total) by mouth every 7 (seven) days. 09/19/21   Bary Castilla, NP    Allergies Patient has no known allergies.   REVIEW OF SYSTEMS  Negative except as noted here or in the History of Present Illness.   PHYSICAL EXAMINATION  Initial Vital Signs Blood pressure (!) 135/90, pulse 93, temperature 97.9 F (36.6 C), resp. rate 18, weight 103 kg, SpO2 100 %.  Examination General: Well-developed, well-nourished female in no acute distress; appearance consistent with age of record HENT: normocephalic; atraumatic Eyes: Normal appearance Neck: supple Heart: regular rate and rhythm Lungs: clear  to auscultation bilaterally Abdomen: soft; nondistended; nontender; bowel sounds present Extremities: Tenderness, swelling and induration of pad of right fifth finger Neurologic: Awake, alert and oriented; motor function intact in all extremities and symmetric; no facial droop Skin: Warm and dry Psychiatric: Anxious   RESULTS  Summary of this visit's results, reviewed and interpreted by myself:   EKG Interpretation  Date/Time:    Ventricular Rate:    PR Interval:    QRS Duration:   QT Interval:    QTC Calculation:   R Axis:     Text Interpretation:         Laboratory Studies: No results found for this or any previous visit (from the past 24 hour(s)). Imaging Studies: No results found.  ED COURSE and MDM  Nursing notes, initial and subsequent vitals signs, including pulse oximetry, reviewed and interpreted by myself.  Vitals:   11/02/22 2354 11/02/22 2355  BP: (!) 135/90   Pulse: 93   Resp: 18   Temp:  97.9 F (36.6 C)  SpO2: 100%   Weight:  103 kg   Medications  cephALEXin (KEFLEX) capsule 1,000 mg (has no administration in time range)  bupivacaine (MARCAINE) 0.5 % (with pres) injection 50 mL (50 mLs Infiltration Given 11/03/22 0013)    Presentation consistent with a felon of the right fifth fingertip.  Will proceed with I&D.  Wound cultured.  Will start patient on Keflex.  PROCEDURES  Procedures INCISION AND DRAINAGE Performed by: Karen Chafe Khyre Germond Consent: Verbal consent obtained. Risks and benefits: risks, benefits and alternatives were discussed Type: Felon  Body area: Right fifth finger tip  Anesthesia: Digital block  Midline incision of distal finger pad was made with a scalpel.  Local anesthetic: Bupivacaine 0.5% without epinephrine  Anesthetic total: 5 ml  Complexity: complex Blunt and sharp dissection to break up loculations and sever septae, respectively  Drainage: Sanguinous  Packing material: None  Patient tolerance: Patient tolerated  the procedure well with no immediate complications.   ED DIAGNOSES     ICD-10-CM   1. Felon of finger of right hand  L03.011          Milferd Ansell, Jenny Reichmann, MD 11/03/22 0040

## 2022-11-02 NOTE — ED Provider Notes (Incomplete)
[facility] Provider Note: Georgena Spurling, MD, FACEP  CSN: [id number] MRN: [id number] ARRIVAL: 11/02/22 at [place]: [id number]   CHIEF COMPLAINT  Finger Swelling   HISTORY OF PRESENT ILLNESS  11/02/22 11:59 PM [name] is a 32 y.o. female    Past Medical History:  Diagnosis Date  . Convulsion, non-epileptic ([place])   . Vaginal Pap smear, abnormal     Past Surgical History:  Procedure Laterality Date  . MASS EXCISION N/A 09/13/2013   Procedure: EXCISION OF SOFT TISSUE MASS ON BACK;  Surgeon: Irene Limbo, MD;  Location: [place];  Service: Plastics;  Laterality: N/A;  . none    . WISDOM TOOTH EXTRACTION      Family History  Problem Relation Age of Onset  . Heart failure Mother   . Hypertension Mother   . Heart failure Father   . Hypertension Father     Social History   Tobacco Use  . Smoking status: Never  . Smokeless tobacco: Never  Vaping Use  . Vaping Use: Never used  Substance Use Topics  . Alcohol use: Yes    Alcohol/week: 4.0 standard drinks of alcohol    Types: 4 Shots of liquor per week  . Drug use: No    Prior to Admission medications   Medication Sig Start Date End Date Taking? Authorizing Provider  amLODipine (NORVASC) 10 MG tablet TAKE 1 TABLET BY MOUTH EVERY DAY 09/30/21   Minette Brine, FNP  cyclobenzaprine (FLEXERIL) 10 MG tablet Take 1 tablet (10 mg total) by mouth 2 (two) times daily as needed for muscle spasms. 05/25/22   Gareth Morgan, MD  famotidine (PEPCID) 40 MG tablet Take 1 tablet (40 mg total) by mouth daily. 11/19/21   Tedd Sias, PA  ferrous sulfate 325 (65 FE) MG tablet TAKE 1 TABLET BY MOUTH EVERY DAY 10/21/21   Ghumman, Ramandeep, NP  hydrochlorothiazide (HYDRODIURIL) 12.5 MG tablet Take 1 tablet (12.5 mg total) by mouth daily. 09/18/21   Bary Castilla, NP  hydrocortisone cream 1 % Apply to affected area 2 times daily 01/21/22   Sherrell Puller, PA-C  ibuprofen (ADVIL) 800 MG tablet  TAKE 1 TABLET BY MOUTH EVERY 8 HOURS AS NEEDED 03/24/22   Shelly Bombard, MD  Magnesium 200 MG TABS Take 1 tablet (200 mg total) by mouth daily. Take with evening meal 11/21/21   Minette Brine, FNP  medroxyPROGESTERone (DEPO-PROVERA) 150 MG/ML injection INJECT 1 ML (150 MG TOTAL) INTO THE MUSCLE EVERY 3 (THREE) MONTHS 03/24/22   Shelly Bombard, MD  metFORMIN (GLUCOPHAGE) 500 MG tablet TAKE 1 TABLET BY MOUTH 2 TIMES DAILY WITH A MEAL. 09/30/21   Minette Brine, FNP  ondansetron (ZOFRAN) 4 MG tablet Take 1 tablet (4 mg total) by mouth every 6 (six) hours. 10/14/22   Redwine, Madison A, PA-C  Vitamin D, Ergocalciferol, (DRISDOL) 1.25 MG (50000 UNIT) CAPS capsule Take 1 capsule (50,000 Units total) by mouth every 7 (seven) days. 09/19/21   Bary Castilla, NP    Allergies Patient has no known allergies.   REVIEW OF SYSTEMS  Negative except as noted here or in the History of Present Illness.   PHYSICAL EXAMINATION  Initial Vital Signs Blood pressure (!) 135/90, pulse 93, temperature 97.9 F (36.6 C), resp. rate 18, weight 103 kg, SpO2 100 %.  Examination General: Well-developed, well-nourished female in no acute distress; appearance consistent with age of record HENT: normocephalic; atraumatic Eyes: pupils equal, round and reactive to light; extraocular muscles  intact Neck: supple Heart: regular rate and rhythm; no murmurs, rubs or gallops Lungs: clear to auscultation bilaterally Abdomen: soft; nondistended; nontender; no masses or hepatosplenomegaly; bowel sounds present Extremities: No deformity; full range of motion; pulses normal Neurologic: Awake, alert and oriented; motor function intact in all extremities and symmetric; no facial droop Skin: Warm and dry Psychiatric: Normal mood and affect   RESULTS  Summary of this visit's results, reviewed and interpreted by myself:   EKG Interpretation  Date/Time:    Ventricular Rate:    PR Interval:    QRS Duration:   QT Interval:     QTC Calculation:   R Axis:     Text Interpretation:         Laboratory Studies: No results found for this or any previous visit (from the past 24 hour(s)). Imaging Studies: No results found.  ED COURSE and MDM  Nursing notes, initial and subsequent vitals signs, including pulse oximetry, reviewed and interpreted by myself.  Vitals:   11/02/22 2354 11/02/22 2355  BP: (!) 135/90   Pulse: 93   Resp: 18   Temp:  97.9 F (36.6 C)  SpO2: 100%   Weight:  103 kg   Medications - No data to display    PROCEDURES  Procedures   ED DIAGNOSES  No diagnosis found.

## 2022-11-03 ENCOUNTER — Encounter (HOSPITAL_BASED_OUTPATIENT_CLINIC_OR_DEPARTMENT_OTHER): Payer: Self-pay | Admitting: Emergency Medicine

## 2022-11-03 MED ORDER — CEPHALEXIN 500 MG PO CAPS: 500.0000 mg | ORAL_CAPSULE | Freq: Four times a day (QID) | ORAL | 0 refills | Status: DC

## 2022-11-03 MED ORDER — CEPHALEXIN 250 MG PO CAPS
1000.0000 mg | ORAL_CAPSULE | Freq: Once | ORAL | Status: AC
  Administered 2022-11-03: 1000 mg via ORAL
  Filled 2022-11-03: qty 4

## 2022-11-03 MED ORDER — FLUCONAZOLE 150 MG PO TABS: ORAL_TABLET | ORAL | 0 refills | Status: DC

## 2022-11-03 MED ORDER — BUPIVACAINE HCL 0.5 % IJ SOLN
50.0000 mL | Freq: Once | INTRAMUSCULAR | Status: AC
  Administered 2022-11-03: 50 mL
  Filled 2022-11-03: qty 1

## 2022-11-06 LAB — AEROBIC CULTURE W GRAM STAIN (SUPERFICIAL SPECIMEN)
Culture: NO GROWTH
Gram Stain: NONE SEEN

## 2022-11-11 DIAGNOSIS — L03011 Cellulitis of right finger: Secondary | ICD-10-CM | POA: Insufficient documentation

## 2023-04-02 ENCOUNTER — Other Ambulatory Visit: Payer: Self-pay | Admitting: Obstetrics

## 2023-04-15 ENCOUNTER — Emergency Department (HOSPITAL_COMMUNITY)
Admission: EM | Admit: 2023-04-15 | Discharge: 2023-04-16 | Disposition: A | Discharge status: Home / Self Care | Payer: PRIVATE HEALTH INSURANCE | Attending: Emergency Medicine | Admitting: Emergency Medicine

## 2023-04-15 DIAGNOSIS — R519 Headache, unspecified: Secondary | ICD-10-CM | POA: Diagnosis present

## 2023-04-15 DIAGNOSIS — T675XXA Heat exhaustion, unspecified, initial encounter: Secondary | ICD-10-CM | POA: Diagnosis not present

## 2023-04-15 LAB — COMPREHENSIVE METABOLIC PANEL
ALT: 17 U/L (ref 0–44)
AST: 15 U/L (ref 15–41)
Albumin: 3.9 g/dL (ref 3.5–5.0)
Alkaline Phosphatase: 44 U/L (ref 38–126)
Anion gap: 14 (ref 5–15)
BUN: 7 mg/dL (ref 6–20)
CO2: 22 mmol/L (ref 22–32)
Calcium: 9.5 mg/dL (ref 8.9–10.3)
Chloride: 104 mmol/L (ref 98–111)
Creatinine, Ser: 0.9 mg/dL (ref 0.44–1.00)
GFR, Estimated: >60 mL/min (ref >60)
Glucose, Bld: 109 mg/dL — ABNORMAL HIGH (ref 70–99)
Potassium: 3.5 mmol/L (ref 3.5–5.1)
Sodium: 140 mmol/L (ref 135–145)
Total Bilirubin: 0.9 mg/dL (ref 0.3–1.2)
Total Protein: 7.1 g/dL (ref 6.5–8.1)

## 2023-04-15 LAB — CBC
HCT: 43.7 % (ref 36.0–46.0)
Hemoglobin: 13.6 g/dL (ref 12.0–15.0)
MCH: 23.4 pg — ABNORMAL LOW (ref 26.0–34.0)
MCHC: 31.1 g/dL (ref 30.0–36.0)
MCV: 75.3 fL — ABNORMAL LOW (ref 80.0–100.0)
Platelets: 144 10*3/uL — ABNORMAL LOW (ref 150–400)
RBC: 5.8 MIL/uL — ABNORMAL HIGH (ref 3.87–5.11)
RDW: 14.4 % (ref 11.5–15.5)
WBC: 7.9 10*3/uL (ref 4.0–10.5)
nRBC: 0 % (ref 0.0–0.2)

## 2023-04-15 LAB — URINALYSIS, ROUTINE W REFLEX MICROSCOPIC
Bilirubin Urine: NEGATIVE
Glucose, UA: NEGATIVE mg/dL
Hgb urine dipstick: NEGATIVE
Ketones, ur: NEGATIVE mg/dL
Nitrite: NEGATIVE
Protein, ur: NEGATIVE mg/dL
Specific Gravity, Urine: 1.024 (ref 1.005–1.030)
pH: 5 (ref 5.0–8.0)

## 2023-04-15 MED ORDER — SODIUM CHLORIDE 0.9 % IV BOLUS
1000.0000 mL | Freq: Once | INTRAVENOUS | Status: AC
  Administered 2023-04-15: 1000 mL via INTRAVENOUS

## 2023-04-15 MED ORDER — KETOROLAC TROMETHAMINE 15 MG/ML IJ SOLN
15.0000 mg | Freq: Once | INTRAMUSCULAR | Status: AC
  Administered 2023-04-15: 15 mg via INTRAVENOUS
  Filled 2023-04-15: qty 1

## 2023-04-15 MED ORDER — ONDANSETRON HCL 4 MG/2ML IJ SOLN
4.0000 mg | Freq: Once | INTRAMUSCULAR | Status: AC
  Administered 2023-04-15: 4 mg via INTRAVENOUS
  Filled 2023-04-15: qty 2

## 2023-04-15 NOTE — ED Provider Notes (Signed)
[place] EMERGENCY DEPARTMENT AT [facility] Provider Note   CSN: [id number] Arrival date & time: 04/15/23  2016     History  No chief complaint on file.   [name] is a 32 y.o. female with past medical history significant for seizures presents to the ED complaining of headache, nausea, and vomiting after being outside at the pool for several hours earlier today.  She also reports that her blood pressure was elevated and she felt dizzy.  Patient states that she continues to have a headache and nausea.  Denies fever, photophobia, visual disturbance, abdominal pain, diarrhea, light-headedness, syncope, weakness.         Home Medications Prior to Admission medications   Medication Sig Start Date End Date Taking? Authorizing Provider  amLODipine (NORVASC) 10 MG tablet TAKE 1 TABLET BY MOUTH EVERY DAY 09/30/21   Arnette Felts, FNP  cephALEXin (KEFLEX) 500 MG capsule Take 1 capsule (500 mg total) by mouth 4 (four) times daily. 11/03/22   Molpus, John, MD  cyclobenzaprine (FLEXERIL) 10 MG tablet Take 1 tablet (10 mg total) by mouth 2 (two) times daily as needed for muscle spasms. 05/25/22   Alvira Monday, MD  famotidine (PEPCID) 40 MG tablet Take 1 tablet (40 mg total) by mouth daily. 11/19/21   Gailen Shelter, PA  ferrous sulfate 325 (65 FE) MG tablet TAKE 1 TABLET BY MOUTH EVERY DAY 10/21/21   Ghumman, Ramandeep, NP  fluconazole (DIFLUCAN) 150 MG tablet Take 1 tablet as needed for vaginal yeast infection.  May repeat in 3 days if symptoms persist. 11/03/22   Molpus, Jonny Ruiz, MD  hydrochlorothiazide (HYDRODIURIL) 12.5 MG tablet Take 1 tablet (12.5 mg total) by mouth daily. 09/18/21   Charlesetta Ivory, NP  hydrocortisone cream 1 % Apply to affected area 2 times daily 01/21/22   Achille Rich, PA-C  ibuprofen (ADVIL) 800 MG tablet TAKE 1 TABLET BY MOUTH EVERY 8 HOURS AS NEEDED 04/03/23   Brock Bad, MD  Magnesium 200 MG TABS Take 1 tablet (200 mg total) by mouth daily.  Take with evening meal 11/21/21   Arnette Felts, FNP  medroxyPROGESTERone (DEPO-PROVERA) 150 MG/ML injection INJECT 1 ML (150 MG TOTAL) INTO THE MUSCLE EVERY 3 (THREE) MONTHS 03/24/22   Brock Bad, MD  metFORMIN (GLUCOPHAGE) 500 MG tablet TAKE 1 TABLET BY MOUTH 2 TIMES DAILY WITH A MEAL. 09/30/21   Arnette Felts, FNP  ondansetron (ZOFRAN) 4 MG tablet Take 1 tablet (4 mg total) by mouth every 6 (six) hours. 10/14/22   Redwine, Madison A, PA-C  Vitamin D, Ergocalciferol, (DRISDOL) 1.25 MG (50000 UNIT) CAPS capsule Take 1 capsule (50,000 Units total) by mouth every 7 (seven) days. 09/19/21   Charlesetta Ivory, NP      Allergies    Patient has no known allergies.    Review of Systems   Review of Systems  Constitutional:  Negative for fever.  Eyes:  Negative for photophobia and visual disturbance.  Gastrointestinal:  Positive for nausea and vomiting. Negative for abdominal pain and diarrhea.  Musculoskeletal:  Negative for neck pain.  Neurological:  Positive for dizziness. Negative for syncope, weakness and light-headedness.    Physical Exam Updated Vital Signs BP (!) 140/97 (BP Location: Left Arm)   Pulse 78   Temp 99.9 F (37.7 C) (Oral)   Resp 16   SpO2 100%  Physical Exam Vitals and nursing note reviewed.  Constitutional:      General: She is not in acute distress.  Appearance: Normal appearance. She is not ill-appearing or diaphoretic.  HENT:     Head: Normocephalic and atraumatic.  Cardiovascular:     Rate and Rhythm: Normal rate and regular rhythm.  Pulmonary:     Effort: Pulmonary effort is normal.  Abdominal:     General: Abdomen is flat.     Palpations: Abdomen is soft.     Tenderness: There is no abdominal tenderness.  Skin:    General: Skin is warm and dry.     Capillary Refill: Capillary refill takes less than 2 seconds.  Neurological:     Mental Status: She is alert. Mental status is at baseline.     Cranial Nerves: Cranial nerves 2-12 are intact.      Sensory: Sensation is intact.     Motor: Motor function is intact.     Coordination: Coordination is intact.     Gait: Gait is intact.     Comments: Cranial Nerves:  II: peripheral fields grossly intact III,IV, VI: ptosis not present, extra-ocular movements intact bilaterally, direct and consensual pupillary light reflexes intact bilaterally V: facial sensation, jaw opening, and bite strength equal bilaterally VII: eyebrow raise, eyelid close, smile, frown, pucker equal bilaterally VIII: hearing grossly normal bilaterally  IX,X: palate elevation and swallowing intact XI: bilateral shoulder shrug and lateral head rotation equal and strong XII: midline tongue extension  Motor: 5/5 strength in BUE and BLE Sensation is grossly intact   Psychiatric:        Mood and Affect: Mood normal.        Behavior: Behavior normal.     ED Results / Procedures / Treatments   Labs (all labs ordered are listed, but only abnormal results are displayed) Labs Reviewed  COMPREHENSIVE METABOLIC PANEL - Abnormal; Notable for the following components:      Result Value   Glucose, Bld 109 (*)    All other components within normal limits  CBC - Abnormal; Notable for the following components:   RBC 5.80 (*)    MCV 75.3 (*)    MCH 23.4 (*)    Platelets 144 (*)    All other components within normal limits  URINALYSIS, ROUTINE W REFLEX MICROSCOPIC - Abnormal; Notable for the following components:   APPearance HAZY (*)    Leukocytes,Ua SMALL (*)    Bacteria, UA RARE (*)    All other components within normal limits  HCG, SERUM, QUALITATIVE    EKG None  Radiology No results found.  Procedures Procedures    Medications Ordered in ED Medications  sodium chloride 0.9 % bolus 1,000 mL (1,000 mLs Intravenous New Bag/Given 04/15/23 2226)  ketorolac (TORADOL) 15 MG/ML injection 15 mg (15 mg Intravenous Given 04/15/23 2226)  ondansetron (ZOFRAN) injection 4 mg (4 mg Intravenous Given 04/15/23 2226)    ED  Course/ Medical Decision Making/ A&P                             Medical Decision Making Risk Prescription drug management.   This patient presents to the ED with chief complaint(s) of headache, dizziness, nausea with vomiting with pertinent past medical history of seizures.  The complaint involves an extensive differential diagnosis and also carries with it a high risk of complications and morbidity.    The differential diagnosis includes dehydration, heat exhaustion, metabolic derangement, migraine headache, tension headache   The initial plan is to obtain baseline labs, UA  Initial Assessment:   Exam significant  for overall well appearing patient who is not in acute distress.  Neuro exam is reassuring.  CN II-XII grossly intact.  5/5 strength in bilateral upper and lower extremities.  Sensation is grossly intact.  Skin is warm and dry.  Abdomen is soft and non tender to palpation.    Independent ECG/labs interpretation:  The following labs were independently interpreted:  CBC with thrombocytopenia, which appears to be chronic.  No anemia or leukocytosis.  Metabolic panel without electrolyte derangement.    Treatment and Reassessment: Will give patient IV fluids, toradol and Zofran and reassess.  Upon reassessment, patient has had resolution of symptoms and was able to pass PO challenge.   Disposition:   Patient's workup is overall reassuring and I suspect her symptoms are related to heat exhaustion.  Discussed preventing heat exhaustion and being safe while outside during extreme temperatures.  Recommended patient increase water intake over the next few days.  The patient has been appropriately medically screened and/or stabilized in the ED. I have low suspicion for any other emergent medical condition which would require further screening, evaluation or treatment in the ED or require inpatient management. At time of discharge the patient is hemodynamically stable and in no acute  distress. I have discussed work-up results and diagnosis with patient and answered all questions. Patient is agreeable with discharge plan. We discussed strict return precautions for returning to the emergency department and they verbalized understanding.            Final Clinical Impression(s) / ED Diagnoses Final diagnoses:  Heat exhaustion, initial encounter    Rx / DC Orders ED Discharge Orders     None         Lenard Simmer, PA-C 04/15/23 2318    Ernie Avena, MD 04/15/23 2356

## 2023-04-15 NOTE — Discharge Instructions (Addendum)
Thank you for allowing me to be part of your care today.   You were evaluated in the ED for headache, dizziness, nausea with vomiting.    Your workup is overall reassuring and I suspect your symptoms are related to heat exhaustion.  Be sure to be safe while outdoors and take breaks from the heat by going indoors, especially during the hotter parts of the day (mid-afternoon).  I recommend increasing your water intake over the next few days and incorporating electrolyte drinks to combat any dehydration from being in the hot sun.    Return to the ED if you develop sudden worsening of your symptoms or if you have any new concerns.

## 2023-04-16 NOTE — ED Notes (Signed)
Pt able to tolerate PO fluids with no nausea or vomiting

## 2023-04-17 ENCOUNTER — Encounter: Payer: Self-pay | Admitting: Family Medicine

## 2023-04-20 ENCOUNTER — Encounter: Payer: Self-pay | Admitting: Family Medicine

## 2023-04-20 ENCOUNTER — Ambulatory Visit (INDEPENDENT_AMBULATORY_CARE_PROVIDER_SITE_OTHER): Payer: PRIVATE HEALTH INSURANCE | Admitting: Family Medicine

## 2023-04-20 VITALS — BP 112/82 | HR 81 | Temp 98.0°F | Ht 69.0 in | Wt 225.0 lb

## 2023-04-20 DIAGNOSIS — Z13220 Encounter for screening for lipoid disorders: Secondary | ICD-10-CM

## 2023-04-20 DIAGNOSIS — I10 Essential (primary) hypertension: Secondary | ICD-10-CM | POA: Diagnosis not present

## 2023-04-20 DIAGNOSIS — Z7689 Persons encountering health services in other specified circumstances: Secondary | ICD-10-CM

## 2023-04-20 DIAGNOSIS — Z1329 Encounter for screening for other suspected endocrine disorder: Secondary | ICD-10-CM | POA: Diagnosis not present

## 2023-04-20 DIAGNOSIS — R519 Headache, unspecified: Secondary | ICD-10-CM

## 2023-04-20 DIAGNOSIS — G43009 Migraine without aura, not intractable, without status migrainosus: Secondary | ICD-10-CM

## 2023-04-20 DIAGNOSIS — R7303 Prediabetes: Secondary | ICD-10-CM

## 2023-04-20 DIAGNOSIS — R569 Unspecified convulsions: Secondary | ICD-10-CM

## 2023-04-20 DIAGNOSIS — E559 Vitamin D deficiency, unspecified: Secondary | ICD-10-CM | POA: Diagnosis not present

## 2023-04-20 LAB — COMPREHENSIVE METABOLIC PANEL
ALT: 12 U/L (ref 0–35)
AST: 17 U/L (ref 0–37)
Albumin: 4.3 g/dL (ref 3.5–5.2)
Alkaline Phosphatase: 44 U/L (ref 39–117)
BUN: 10 mg/dL (ref 6–23)
CO2: 26 mEq/L (ref 19–32)
Calcium: 10 mg/dL (ref 8.4–10.5)
Chloride: 104 mEq/L (ref 96–112)
Creatinine, Ser: 0.77 mg/dL (ref 0.40–1.20)
GFR: 101.9 mL/min (ref >60.00)
Glucose, Bld: 91 mg/dL (ref 70–99)
Potassium: 4.6 mEq/L (ref 3.5–5.1)
Sodium: 139 mEq/L (ref 135–145)
Total Bilirubin: 0.8 mg/dL (ref 0.2–1.2)
Total Protein: 7.5 g/dL (ref 6.0–8.3)

## 2023-04-20 LAB — CBC WITH DIFFERENTIAL/PLATELET
Basophils Absolute: 0 10*3/uL (ref 0.0–0.1)
Basophils Relative: 0.5 % (ref 0.0–3.0)
Eosinophils Absolute: 0.1 10*3/uL (ref 0.0–0.7)
Eosinophils Relative: 2 % (ref 0.0–5.0)
HCT: 42.8 % (ref 36.0–46.0)
Hemoglobin: 13.3 g/dL (ref 12.0–15.0)
Lymphocytes Relative: 32.6 % (ref 12.0–46.0)
Lymphs Abs: 2 10*3/uL (ref 0.7–4.0)
MCHC: 31.1 g/dL (ref 30.0–36.0)
MCV: 74.4 fl — ABNORMAL LOW (ref 78.0–100.0)
Monocytes Absolute: 0.4 10*3/uL (ref 0.1–1.0)
Monocytes Relative: 7.2 % (ref 3.0–12.0)
Neutro Abs: 3.6 10*3/uL (ref 1.4–7.7)
Neutrophils Relative %: 57.7 % (ref 43.0–77.0)
Platelets: 222 10*3/uL (ref 150.0–400.0)
RBC: 5.76 Mil/uL — ABNORMAL HIGH (ref 3.87–5.11)
RDW: 14.3 % (ref 11.5–15.5)
WBC: 6.2 10*3/uL (ref 4.0–10.5)

## 2023-04-20 LAB — LIPID PANEL
Cholesterol: 140 mg/dL (ref 0–200)
HDL: 37.8 mg/dL — ABNORMAL LOW (ref >39.00)
LDL Cholesterol: 90 mg/dL (ref 0–99)
NonHDL: 101.87
Total CHOL/HDL Ratio: 4
Triglycerides: 60 mg/dL (ref 0.0–149.0)
VLDL: 12 mg/dL (ref 0.0–40.0)

## 2023-04-20 LAB — HEMOGLOBIN A1C: Hgb A1c MFr Bld: 6 % (ref 4.6–6.5)

## 2023-04-20 LAB — TSH: TSH: 1.39 u[IU]/mL (ref 0.35–5.50)

## 2023-04-20 LAB — VITAMIN D 25 HYDROXY (VIT D DEFICIENCY, FRACTURES): VITD: 14.95 ng/mL — ABNORMAL LOW (ref 30.00–100.00)

## 2023-04-20 MED ORDER — OLMESARTAN MEDOXOMIL 20 MG PO TABS
20.0000 mg | ORAL_TABLET | Freq: Every day | ORAL | 0 refills | Status: DC
Start: 2023-04-20 — End: 2023-05-20

## 2023-04-20 NOTE — Progress Notes (Signed)
New Patient Office Visit  Subjective    Patient ID: [name], female    DOB: 1991/05/11  Age: 32 y.o. MRN: [id number]  CC:  Chief Complaint  Patient presents with  . Establish Care    Pt is here today to Est. Care Pt reports she was seen in Er a few days ago, due to dehydration and B/P was elevated Pt reports she was given fluids and her B/P have been 130s/ 70s-80 Pt reports she has hx of migraines and hypertension and was given  Propranolol 60 mg and this is not helping Pt reports her PCP has been out and she really wanted to see someone new. Pt is FASTING Pt has B/P cuff with her today and reading was 121/82    HPI [name] presents to establish care with new provider.   Previous primary care provider was [facility], FNP with [facility]. Last visit was 04/21/2022.   Specialist: [facility] for [facility] with Dr. Scheryl Darter. Last visit was 01/15/2021.   HTN: Chronic. Patient is taking Propranolol 60 mg daily. She reports she has been on this medication for a couple of months. She reports she monitors her blood pressure daily. Ranges 138-145/75-80. Denies CP, SHOB, and lower extremity edema.  She reports she does have frontal headache every other day and migraines usually are 3-4 times a month. Describes frontal headaches as a constant heaviness, but usually does not take anything for it. Associated dizziness and blurry vision when she has frontal headache. She reports has had an ophthalmology exam this year. She reports once in a while she will take Tylenol. She reports resting helps with her frontal headache. She reports her migraines are usually on her right side of her head. Usually last about 30 minutes with light sensitivities. She reports she previously took Ibuprofen with relief. History of seizure.. Last seizure was 2 years ago. She reports she seen neurology, but unknown of whom.     Usually drinks greater than 64 oz of fluid a day, but only drinks about 32oz of water a day.  Outpatient Encounter Medications as of 04/20/2023  Medication Sig  . cephALEXin (KEFLEX) 500 MG capsule Take 1 capsule (500 mg total) by mouth 4 (four) times daily.  . medroxyPROGESTERone (DEPO-PROVERA) 150 MG/ML injection INJECT 1 ML (150 MG TOTAL) INTO THE MUSCLE EVERY 3 (THREE) MONTHS  . amLODipine (NORVASC) 10 MG tablet TAKE 1 TABLET BY MOUTH EVERY DAY (Patient not taking: Reported on 04/20/2023)  . cyclobenzaprine (FLEXERIL) 10 MG tablet Take 1 tablet (10 mg total) by mouth 2 (two) times daily as needed for muscle spasms.  . famotidine (PEPCID) 40 MG tablet Take 1 tablet (40 mg total) by mouth daily. (Patient not taking: Reported on 04/20/2023)  . ferrous sulfate 325 (65 FE) MG tablet TAKE 1 TABLET BY MOUTH EVERY DAY  . fluconazole (DIFLUCAN) 150 MG tablet Take 1 tablet as needed for vaginal yeast infection.  May repeat in 3 days if symptoms persist.  . hydrochlorothiazide (HYDRODIURIL) 12.5 MG tablet Take 1 tablet (12.5 mg total) by mouth daily.  . hydrocortisone cream 1 % Apply to affected area 2 times daily  . ibuprofen (ADVIL) 800 MG tablet TAKE 1 TABLET BY MOUTH EVERY 8 HOURS AS NEEDED (Patient not taking: Reported on 04/20/2023)  . Magnesium 200 MG TABS Take 1 tablet (200 mg total) by mouth daily. Take with evening meal (Patient not taking: Reported on  04/20/2023)  . metFORMIN (GLUCOPHAGE) 500 MG tablet TAKE 1 TABLET BY MOUTH 2 TIMES DAILY WITH A MEAL. (Patient not taking: Reported on 04/20/2023)  . ondansetron (ZOFRAN) 4 MG tablet Take 1 tablet (4 mg total) by mouth every 6 (six) hours. (Patient not taking: Reported on 04/20/2023)  . Vitamin D, Ergocalciferol, (DRISDOL) 1.25 MG (50000 UNIT) CAPS capsule Take 1 capsule (50,000 Units total) by mouth every 7 (seven) days. (Patient not taking: Reported on 04/20/2023)   Facility-Administered Encounter Medications as of 04/20/2023  Medication  .  medroxyPROGESTERone (DEPO-PROVERA) injection 150 mg  . medroxyPROGESTERone (DEPO-PROVERA) injection 150 mg    Past Medical History:  Diagnosis Date  . Convulsion, non-epileptic (HCC)   . Hypertension   . Migraine    Right side  . Vaginal Pap smear, abnormal     Past Surgical History:  Procedure Laterality Date  . MASS EXCISION N/A 09/13/2013   Procedure: EXCISION OF SOFT TISSUE MASS ON BACK;  Surgeon: Glenna Fellows, MD;  Location: [facility];  Service: Plastics;  Laterality: N/A;  . none    . WISDOM TOOTH EXTRACTION      Family History  Problem Relation Age of Onset  . Heart failure Mother   . Hypertension Mother   . Heart failure Father   . Hypertension Father   . ADD / ADHD Child     Social History   Socioeconomic History  . Marital status: Single    Spouse name: Not on file  . Number of children: 2  . Years of education: 41  . Highest education level: High school graduate  Occupational History    Comment: n/a  Tobacco Use  . Smoking status: Never  . Smokeless tobacco: Never  Vaping Use  . Vaping Use: Never used  Substance and Sexual Activity  . Alcohol use: Not Currently    Comment: Daily-1 CAN  . Drug use: No  . Sexual activity: Yes    Birth control/protection: Injection  Other Topics Concern  . Not on file  Social History Narrative   ** Merged History Encounter **   Patient lives at home with her mother.   Caffeine Use: none       Social Determinants of Health   Financial Resource Strain: High Risk (04/17/2023)   Overall Financial Resource Strain (CARDIA)   . Difficulty of Paying Living Expenses: Very hard  Food Insecurity: No Food Insecurity (04/17/2023)   Hunger Vital Sign   . Worried About Programme researcher, broadcasting/film/video in the Last Year: Never true   . Ran Out of Food in the Last Year: Never true  Transportation Needs: No Transportation Needs (04/17/2023)   PRAPARE - Transportation   . Lack of Transportation (Medical): No   . Lack of  Transportation (Non-Medical): No  Physical Activity: Inactive (04/17/2023)   Exercise Vital Sign   . Days of Exercise per Week: 0 days   . Minutes of Exercise per Session: 0 min  Stress: No Stress Concern Present (04/17/2023)   [employer] of Occupational Health - Occupational Stress Questionnaire   . Feeling of Stress : Not at all  Social Connections: Unknown (04/17/2023)   Social Connection and Isolation Panel [NHANES]   . Frequency of Communication with Friends and Family: Three times a week   . Frequency of Social Gatherings with Friends and Family: Three times a week   . Attends Religious Services: Never   . Active Member of Clubs or Organizations: No   . Attends Club or  Organization Meetings: Never   . Marital Status: Not on file  Intimate Partner Violence: Not At Risk (04/17/2023)   Humiliation, Afraid, Rape, and Kick questionnaire   . Fear of Current or Ex-Partner: No   . Emotionally Abused: No   . Physically Abused: No   . Sexually Abused: No    ROS See HPI above    Objective    BP 112/82   Pulse 81   Temp 98 F (36.7 C)   Ht 5\' 9"  (1.753 m)   Wt 225 lb (102.1 kg)   SpO2 99%   BMI 33.23 kg/m   Physical Exam    Assessment & Plan:  Encounter to establish care  1.Review health maintenance -Tdap- Request records from [facility]  -Declines covid boosters.    No follow-ups on file.   Zandra Abts, NP

## 2023-04-20 NOTE — Patient Instructions (Addendum)
-  It was a pleasure to meet you today and look forward to taking care of you.  -Ordered labs. Office will call with lab results.  -Referral placed to neurology for headaches, migraines, and history of seizures. If you do not hear back about an appointment either through a phone call or MyChart, please call back to the office.  -Discontinue Propranolol 60mg  daily.  -START Olmesartan 20mg  daily. Recommend to obtain your blood pressure once in the AM and PM, record readings, and bring to your next appointment.  -Recommend to increase water intake above 64oz a day.  -You may take Ibuprofen for headache.  -Follow up in 2 weeks for blood pressure management.

## 2023-04-21 ENCOUNTER — Other Ambulatory Visit: Payer: Self-pay

## 2023-04-21 ENCOUNTER — Telehealth: Payer: Self-pay

## 2023-04-21 DIAGNOSIS — R519 Headache, unspecified: Secondary | ICD-10-CM | POA: Insufficient documentation

## 2023-04-21 DIAGNOSIS — E559 Vitamin D deficiency, unspecified: Secondary | ICD-10-CM

## 2023-04-21 DIAGNOSIS — I10 Essential (primary) hypertension: Secondary | ICD-10-CM | POA: Insufficient documentation

## 2023-04-21 MED ORDER — VITAMIN D (ERGOCALCIFEROL) 1.25 MG (50000 UNIT) PO CAPS
50000.0000 [IU] | ORAL_CAPSULE | ORAL | 0 refills | Status: DC
Start: 2023-04-21 — End: 2023-05-07

## 2023-04-21 MED ORDER — VITAMIN D (ERGOCALCIFEROL) 1.25 MG (50000 UNIT) PO CAPS: 50000.0000 [IU] | ORAL_CAPSULE | ORAL | 0 refills | Status: DC

## 2023-04-21 NOTE — Telephone Encounter (Signed)
-----   Message from Debbie Apley, NP sent at 04/21/2023  8:16 AM EDT ----- Good (HDL) cholesterol is not at goal, recommend to increase fiber and antioxidants.  Vitamin D is significantly low, recommend vitamin D3 50,000IU tablet, take 1 tablet every 7 days for 12 weeks. (Dx. Vitamin D3 deficiency) Then, you will need vitamin D checked in 3 months.  A1c is prediabetes. Recommend to decrease carbohydrates and sweets. We can discuss more about prediabetes at your 07/15 appointment.  All other labs are stable.

## 2023-04-21 NOTE — Assessment & Plan Note (Signed)
Ambulatory to neurology.

## 2023-04-21 NOTE — Telephone Encounter (Signed)
Noted  

## 2023-04-21 NOTE — Assessment & Plan Note (Signed)
Referral to neurology. Recommend patient to take Ibuprofen for pain since it has relieved pain in the past. Recommend to increase water intake above 64oz of water a day.

## 2023-04-21 NOTE — Assessment & Plan Note (Addendum)
Discontinue Propranolol 60mg  daily. Based on patient, her systolic blood pressure may be elevated at home and she reports this medication was started to help with headaches, which has not helped.  Prescribed Olmesartan 20mg  daily. Recommend to obtain blood pressure once in the AM and PM, record readings, and bring to her next appointment. Ordered CBC with Diff and CMP.

## 2023-04-21 NOTE — Assessment & Plan Note (Signed)
Referral to neurology for past history of seizures with having headaches and migraines.

## 2023-04-21 NOTE — Telephone Encounter (Signed)
Appt scheduled 07/22/2023 at 11am

## 2023-04-30 ENCOUNTER — Encounter: Payer: Self-pay | Admitting: Neurology

## 2023-05-04 ENCOUNTER — Ambulatory Visit: Payer: PRIVATE HEALTH INSURANCE | Admitting: Family Medicine

## 2023-05-04 DIAGNOSIS — Z09 Encounter for follow-up examination after completed treatment for conditions other than malignant neoplasm: Secondary | ICD-10-CM

## 2023-05-04 DIAGNOSIS — I10 Essential (primary) hypertension: Secondary | ICD-10-CM

## 2023-05-07 NOTE — Progress Notes (Deleted)
   Established Patient Office Visit   Subjective:  Patient ID: [name], female    DOB: 10/12/1991  Age: 32 y.o. MRN: [id number]  No chief complaint on file.   HPI HTN: Chronic. At last appointment, Propranolol was discontinued and started Olmesartan 20mg  daily.   Prediabetes: Patients A1c was 6.0 at her last visit on 07/01.   Vitamin D Deficiency: At last appointment her vitamin D was 14.95. She was advised to start Vitamin D3 50,000 once a week for 12 weeks.  ROS See HPI above     Objective:     There were no vitals taken for this visit. {Vitals History (Optional):23777}  Physical Exam  No results found for any visits on 05/08/23.  The ASCVD Risk score (Arnett DK, et al., 2019) failed to calculate for the following reasons:   The 2019 ASCVD risk score is only valid for ages 26 to 71    Assessment & Plan:  There are no diagnoses linked to this encounter.  1.At previous visit, patient was referred to neurology for seizures and headaches. She has an appointment with [facility] with Dr. Shon Millet on 08/27/2023.  No follow-ups on file.   Zandra Abts, NP

## 2023-05-08 ENCOUNTER — Ambulatory Visit: Payer: PRIVATE HEALTH INSURANCE | Admitting: Family Medicine

## 2023-05-17 ENCOUNTER — Other Ambulatory Visit: Payer: Self-pay | Admitting: Family Medicine

## 2023-05-17 DIAGNOSIS — I10 Essential (primary) hypertension: Secondary | ICD-10-CM

## 2023-05-18 ENCOUNTER — Telehealth: Payer: Self-pay

## 2023-05-18 NOTE — Telephone Encounter (Signed)
Left vm to call and make appt.

## 2023-05-20 ENCOUNTER — Other Ambulatory Visit: Payer: Self-pay

## 2023-05-20 DIAGNOSIS — I10 Essential (primary) hypertension: Secondary | ICD-10-CM

## 2023-05-20 MED ORDER — OLMESARTAN MEDOXOMIL 20 MG PO TABS
20.0000 mg | ORAL_TABLET | Freq: Every day | ORAL | 0 refills | Status: DC
Start: 2023-05-20 — End: 2024-03-23

## 2023-05-20 NOTE — Telephone Encounter (Signed)
Left vm to call and schedule this appt. Will send to Waymon Budge to see how she will advise.

## 2023-05-20 NOTE — Telephone Encounter (Signed)
Refilled-olmesartan (BENICAR) 20 MG tablet for 30 days.

## 2023-05-20 NOTE — Telephone Encounter (Signed)
Letter out to pt to call office

## 2023-06-01 ENCOUNTER — Ambulatory Visit: Payer: PRIVATE HEALTH INSURANCE | Admitting: Family Medicine

## 2023-07-22 ENCOUNTER — Other Ambulatory Visit: Payer: PRIVATE HEALTH INSURANCE

## 2023-07-22 ENCOUNTER — Encounter: Payer: Self-pay | Admitting: Family Medicine

## 2023-08-18 ENCOUNTER — Ambulatory Visit (INDEPENDENT_AMBULATORY_CARE_PROVIDER_SITE_OTHER): Payer: Medicaid Other | Admitting: Emergency Medicine

## 2023-08-18 VITALS — BP 114/65 | HR 64 | Wt 223.0 lb

## 2023-08-18 DIAGNOSIS — Z3042 Encounter for surveillance of injectable contraceptive: Secondary | ICD-10-CM | POA: Diagnosis not present

## 2023-08-18 MED ORDER — MEDROXYPROGESTERONE ACETATE 150 MG/ML IM SUSP
150.0000 mg | Freq: Once | INTRAMUSCULAR | Status: AC
  Administered 2023-08-18: 150 mg via INTRAMUSCULAR

## 2023-08-18 NOTE — Progress Notes (Signed)
Date last pap: 01/15/2021. 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 No records Side Effects if any: NA. Serum HCG indicated? NA. Depo-Provera 150 mg IM given by: Resa Miner, RN into LD, tolerated well in office. Next appointment due Jan 14- Jan 28.

## 2023-08-26 NOTE — Progress Notes (Deleted)
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   ***   Subjective:  [name] is a 32 year old ***-handed female with HTN who presents for migraines and history of seizures.  History supplemented by hospital records and referring provider's note.  CT and MRI of brain from Jan 2012 personally reviewed.  Migraines: ***  Seizure-like Events: She began experiencing new-onset seizure-like events in January 2012.  She was hospitalized at [facility] at that time.  Semiology of spells described as "fall with retained consciousness, shaking of the bilateral arms and shortness of breath.  She then arches her back and neck.  She maintains awareness for about 5-10 minutes but then "blanks out" for 5 minutes.  Even during this part of the spell, she can hear those around her although the sound is "fuzzy".  Afterwards, she cannot remember who people are for a few minutes and then will be sleepy.  There has been no loss of bowel or bladder function and no tongue biting."  CT and MRI of brain were negative.  She was hooked up to prolonged EEG which captured 3 spells without electrographic correlate.  Despite this finding, she was still started on Dilantin, which she subsequently discontinued.  She was admitted to the EMU at [facility] the following month.  She did not exhibit any spells but EEG was normal and given history of patient experiencing significant emotional stress and prior spells occurred with negative EEG, pseudoseizures were diagnosed.    Past NSAIDS/analgesics:  naproxen, tramadol, diclofenac Past abortive triptans:   sumatriptan tab Past abortive ergotamine:  *** Past muscle relaxants:  Robaxin, Flexeril Past anti-emetic:  Zofran Past antihypertensive medications:  amlodipine, olmesartan Past antidepressant medications:  *** Past anticonvulsant medications:  topiramate, phenytoin Past anti-CGRP:  *** Past vitamins/Herbal/Supplements:  magnesium Past antihistamines/decongestants:  *** Other past therapies:  ***  Current NSAIDS/analgesics:  ibuprofen Current triptans:  *** Current ergotamine:  *** Current anti-emetic:  *** Current muscle relaxants:  *** Current Antihypertensive medications:  *** Current Antidepressant medications:  *** Current Anticonvulsant medications:  *** Current anti-CGRP:  *** Current Vitamins/Herbal/Supplements:  *** Current Antihistamines/Decongestants:  *** Other therapy:  *** Birth control:  *** Other medications:  ***   Caffeine:  *** Alcohol:  *** Smoker:  *** Diet:  *** Exercise:  *** Depression:  ***; Anxiety:  *** Other pain:  *** Sleep hygiene:  *** Family history of headache:  ***      PAST MEDICAL HISTORY: 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HCC)    Hypertension    Migraine    Right side   Prediabetes    Vaginal Pap smear, abnormal    Vitamin D deficiency     PAST SURGICAL HISTORY: Past Surgical History:  Procedure Laterality Date   MASS EXCISION N/A 09/13/2013   Procedure: EXCISION OF SOFT TISSUE MASS ON BACK;  Surgeon: Glenna Fellows, MD;  Location: [facility];  Service: Plastics;  Laterality: N/A;   none     WISDOM TOOTH EXTRACTION      MEDICATIONS: Current Outpatient Medications on File Prior to Visit  Medication Sig Dispense Refill   medroxyPROGESTERone (DEPO-PROVERA) 150 MG/ML injection INJECT 1 ML (  150 MG TOTAL) INTO THE MUSCLE EVERY 3 (THREE) MONTHS 1 mL 3   olmesartan (BENICAR) 20 MG tablet Take 1 tablet (20 mg total) by mouth daily. 30 tablet 0   Vitamin D, Ergocalciferol,  (DRISDOL) 1.25 MG (50000 UNIT) CAPS capsule Take 1 capsule (50,000 Units total) by mouth every 7 (seven) days. 12 capsule 0   Current Facility-Administered Medications on File Prior to Visit  Medication Dose Route Frequency Provider Last Rate Last Admin   medroxyPROGESTERone (DEPO-PROVERA) injection 150 mg  150 mg Intramuscular Q90 days Orvilla Cornwall A, CNM   150 mg at 08/05/21 1610   medroxyPROGESTERone (DEPO-PROVERA) injection 150 mg  150 mg Intramuscular Once Hermina Staggers, MD        ALLERGIES: Allergies  Allergen Reactions   Amlodipine Nausea And Vomiting    FAMILY HISTORY: Family History  Problem Relation Age of Onset   Hypertension Mother    ADD / ADHD Brother    ADD / ADHD Daughter    Dementia Maternal Grandmother    Heart failure Maternal Grandfather     Objective:  *** General: No acute distress.  Patient appears well-groomed.   Head:  Normocephalic/atraumatic Eyes:  fundi examined but not visualized Neck: supple, no paraspinal tenderness, full range of motion Back: No paraspinal tenderness Heart: regular rate and rhythm Lungs: Clear to auscultation bilaterally. Vascular: No carotid bruits. Neurological Exam: Mental status: alert and oriented to person, place, and time, speech fluent and not dysarthric, language intact. Cranial nerves: CN I: not tested CN II: pupils equal, round and reactive to light, visual fields intact CN III, IV, VI:  full range of motion, no nystagmus, no ptosis CN V: facial sensation intact. CN VII: upper and lower face symmetric CN VIII: hearing intact CN IX, X: gag intact, uvula midline CN XI: sternocleidomastoid and trapezius muscles intact CN XII: tongue midline Bulk & Tone: normal, no fasciculations. Motor:  muscle strength 5/5 throughout Sensation:  Pinprick, temperature and vibratory sensation intact. Deep Tendon Reflexes:  2+ throughout,  toes downgoing.   Finger to nose testing:  Without dysmetria.   Heel to shin:   Without dysmetria.   Gait:  Normal station and stride.  Romberg negative.    Thank you for allowing me to take part in the care of this patient.  Shon Millet, DO  CC: ***

## 2023-08-27 ENCOUNTER — Encounter: Payer: Self-pay | Admitting: Neurology

## 2023-08-27 ENCOUNTER — Ambulatory Visit: Payer: PRIVATE HEALTH INSURANCE | Admitting: Neurology

## 2023-09-26 ENCOUNTER — Emergency Department (HOSPITAL_BASED_OUTPATIENT_CLINIC_OR_DEPARTMENT_OTHER)
Admission: EM | Admit: 2023-09-26 | Discharge: 2023-09-26 | Disposition: A | Discharge status: Home / Self Care | Payer: 59 | Attending: Emergency Medicine | Admitting: Emergency Medicine

## 2023-09-26 ENCOUNTER — Other Ambulatory Visit: Payer: Self-pay

## 2023-09-26 ENCOUNTER — Encounter (HOSPITAL_BASED_OUTPATIENT_CLINIC_OR_DEPARTMENT_OTHER): Payer: Self-pay | Admitting: Emergency Medicine

## 2023-09-26 DIAGNOSIS — R1084 Generalized abdominal pain: Secondary | ICD-10-CM | POA: Diagnosis not present

## 2023-09-26 DIAGNOSIS — R112 Nausea with vomiting, unspecified: Secondary | ICD-10-CM | POA: Insufficient documentation

## 2023-09-26 LAB — URINALYSIS, ROUTINE W REFLEX MICROSCOPIC
Bilirubin Urine: NEGATIVE
Glucose, UA: NEGATIVE mg/dL
Hgb urine dipstick: NEGATIVE
Ketones, ur: NEGATIVE mg/dL
Leukocytes,Ua: NEGATIVE
Nitrite: NEGATIVE
Protein, ur: NEGATIVE mg/dL
Specific Gravity, Urine: 1.023 (ref 1.005–1.030)
pH: 7.5 (ref 5.0–8.0)

## 2023-09-26 LAB — LIPASE, BLOOD: Lipase: 20 U/L (ref 11–51)

## 2023-09-26 LAB — PREGNANCY, URINE: Preg Test, Ur: NEGATIVE

## 2023-09-26 LAB — COMPREHENSIVE METABOLIC PANEL
ALT: 11 U/L (ref 0–44)
AST: 11 U/L — ABNORMAL LOW (ref 15–41)
Albumin: 4 g/dL (ref 3.5–5.0)
Alkaline Phosphatase: 48 U/L (ref 38–126)
Anion gap: 6 (ref 5–15)
BUN: 8 mg/dL (ref 6–20)
CO2: 25 mmol/L (ref 22–32)
Calcium: 8.8 mg/dL — ABNORMAL LOW (ref 8.9–10.3)
Chloride: 107 mmol/L (ref 98–111)
Creatinine, Ser: 0.8 mg/dL (ref 0.44–1.00)
GFR, Estimated: >60 mL/min (ref >60)
Glucose, Bld: 131 mg/dL — ABNORMAL HIGH (ref 70–99)
Potassium: 3.7 mmol/L (ref 3.5–5.1)
Sodium: 138 mmol/L (ref 135–145)
Total Bilirubin: 0.6 mg/dL (ref <1.2)
Total Protein: 6.9 g/dL (ref 6.5–8.1)

## 2023-09-26 LAB — CBC
HCT: 41.7 % (ref 36.0–46.0)
Hemoglobin: 13.1 g/dL (ref 12.0–15.0)
MCH: 23.5 pg — ABNORMAL LOW (ref 26.0–34.0)
MCHC: 31.4 g/dL (ref 30.0–36.0)
MCV: 74.7 fL — ABNORMAL LOW (ref 80.0–100.0)
Platelets: 226 10*3/uL (ref 150–400)
RBC: 5.58 MIL/uL — ABNORMAL HIGH (ref 3.87–5.11)
RDW: 14.7 % (ref 11.5–15.5)
WBC: 5.7 10*3/uL (ref 4.0–10.5)
nRBC: 0 % (ref 0.0–0.2)

## 2023-09-26 MED ORDER — ONDANSETRON 4 MG PO TBDP
4.0000 mg | ORAL_TABLET | Freq: Once | ORAL | Status: AC
  Administered 2023-09-26: 4 mg via ORAL
  Filled 2023-09-26: qty 1

## 2023-09-26 MED ORDER — DICYCLOMINE HCL 20 MG PO TABS: 20.0000 mg | ORAL_TABLET | Freq: Two times a day (BID) | ORAL | 0 refills | Status: DC

## 2023-09-26 MED ORDER — ONDANSETRON 4 MG PO TBDP
4.0000 mg | ORAL_TABLET | Freq: Three times a day (TID) | ORAL | 0 refills | Status: DC | PRN
Start: 2023-09-26 — End: 2024-03-23

## 2023-09-26 MED ORDER — ONDANSETRON 4 MG PO TBDP: 4.0000 mg | ORAL_TABLET | Freq: Three times a day (TID) | ORAL | 0 refills | Status: DC | PRN

## 2023-09-26 NOTE — ED Notes (Signed)
Pt. Drank glass water, states "a little came up" but now feels better.

## 2023-09-26 NOTE — Discharge Instructions (Addendum)
Take the medication as prescribed  Return for any new or worsening symptoms

## 2023-09-26 NOTE — ED Triage Notes (Signed)
Pt via pov from home with emesis since this morning; she states she started vomiting right after eating, so thinks it may be food poisoning. Pt alert & oriented, nad noted.

## 2023-09-26 NOTE — ED Provider Notes (Signed)
[place] EMERGENCY DEPARTMENT AT [facility] Provider Note   CSN: [id number] Arrival date & time: 09/26/23  1708    History  Chief Complaint  Patient presents with   Emesis    [name] is a 32 y.o. female here for evaluation of nausea and vomiting.  Ate a "work tray" at the [employer] where she works.  Developed diffuse abdominal cramping multiple episodes of NBNB emesis.  Apparently multiple coworkers had similar symptoms.  No headache, nausea vomiting, cough, dysuria, hematuria, diarrhea.  No meds PTA.  No recent antibiotics  HPI     Home Medications Prior to Admission medications   Medication Sig Start Date End Date Taking? Authorizing Provider  dicyclomine (BENTYL) 20 MG tablet Take 1 tablet (20 mg total) by mouth 2 (two) times daily. 09/26/23  Yes Kinza Gouveia A, PA-C  ondansetron (ZOFRAN-ODT) 4 MG disintegrating tablet Take 1 tablet (4 mg total) by mouth every 8 (eight) hours as needed. 09/26/23  Yes Ashunti Schofield A, PA-C  medroxyPROGESTERone (DEPO-PROVERA) 150 MG/ML injection INJECT 1 ML (150 MG TOTAL) INTO THE MUSCLE EVERY 3 (THREE) MONTHS 03/24/22   Brock Bad, MD  olmesartan (BENICAR) 20 MG tablet Take 1 tablet (20 mg total) by mouth daily. 05/20/23 06/19/23  Alveria Apley, NP  Vitamin D, Ergocalciferol, (DRISDOL) 1.25 MG (50000 UNIT) CAPS capsule Take 1 capsule (50,000 Units total) by mouth every 7 (seven) days. 04/21/23   Alveria Apley, NP      Allergies    Amlodipine    Review of Systems   Review of Systems  Constitutional: Negative.   HENT: Negative.    Respiratory: Negative.    Cardiovascular: Negative.   Gastrointestinal:  Positive for abdominal pain, nausea and vomiting. Negative for abdominal distention, anal bleeding, blood in stool, constipation, diarrhea and rectal pain.  Genitourinary: Negative.   Musculoskeletal: Negative.   Skin: Negative.   Neurological: Negative.   All other systems reviewed and are  negative.   Physical Exam Updated Vital Signs BP 129/76 (BP Location: Left Arm)   Pulse 93   Temp 98.4 F (36.9 C)   Resp 15   Ht 5\' 9"  (1.753 m)   Wt 103 kg   SpO2 100%   BMI 33.52 kg/m  Physical Exam Vitals and nursing note reviewed.  Constitutional:      General: She is not in acute distress.    Appearance: She is well-developed. She is not ill-appearing, toxic-appearing or diaphoretic.  HENT:     Head: Normocephalic and atraumatic.     Mouth/Throat:     Mouth: Mucous membranes are moist.  Eyes:     Pupils: Pupils are equal, round, and reactive to light.  Cardiovascular:     Rate and Rhythm: Normal rate and regular rhythm.     Pulses: Normal pulses.     Heart sounds: Normal heart sounds.  Pulmonary:     Effort: Pulmonary effort is normal. No respiratory distress.     Breath sounds: Normal breath sounds.     Comments: Clear bilaterally, speaks in full sentences without difficulty Abdominal:     General: Bowel sounds are normal. There is no distension.     Palpations: Abdomen is soft. There is no mass.     Tenderness: There is no abdominal tenderness. There is no right CVA tenderness, left CVA tenderness, guarding or rebound.     Hernia: No hernia is present.     Comments: Soft, nontender, no rebound or guarding  Musculoskeletal:  General: No swelling. Normal range of motion.     Cervical back: Normal range of motion and neck supple.     Right lower leg: No edema.     Left lower leg: No edema.  Skin:    General: Skin is warm and dry.     Comments: No obvious rashes or lesions to exposed skin  Neurological:     General: No focal deficit present.     Mental Status: She is alert and oriented to person, place, and time.     ED Results / Procedures / Treatments   Labs (all labs ordered are listed, but only abnormal results are displayed) Labs Reviewed  COMPREHENSIVE METABOLIC PANEL - Abnormal; Notable for the following components:      Result Value    Glucose, Bld 131 (*)    Calcium 8.8 (*)    AST 11 (*)    All other components within normal limits  CBC - Abnormal; Notable for the following components:   RBC 5.58 (*)    MCV 74.7 (*)    MCH 23.5 (*)    All other components within normal limits  LIPASE, BLOOD  URINALYSIS, ROUTINE W REFLEX MICROSCOPIC  PREGNANCY, URINE    EKG None  Radiology No results found.  Procedures Procedures    Medications Ordered in ED Medications  ondansetron (ZOFRAN-ODT) disintegrating tablet 4 mg (4 mg Oral Given 09/26/23 1810)   ED Course/ Medical Decision Making/ A&P   32 year old here for evaluation of NBNB emesis and diffuse abdominal pain which began after eating food at her employer.  Apparently multiple employees with similar symptoms.  She is afebrile, nonseptic, not ill-appearing.  No URI symptoms.  No changes in bowel movements.  No UTI symptoms, denies chance of pregnancy.  Was otherwise well prior to onset.  No recent trauma, injury, no headache.  Plan on labs and reassess.  Offered IV, IV antiemetic versus p.o. antiemetics.  Requesting p.o. trial Zofran will plan on labs  Labs and imaging personally viewed interpreted CBC without leukocytosis, hemoglobin 13.1 UA negative for infection Pregnancy test negative and lipase 20 Metabolic panel glucose 131  Patient reassessed. Emesis improved. None in ED. Tolerating PO intake in ED. will DC her home with symptomatic management.  She will return for any worsening symptoms.  Patient is nontoxic, nonseptic appearing, in no apparent distress.  Patient's pain and other symptoms adequately managed in emergency department.  Fluid bolus given.  Labs, imaging and vitals reviewed.  Patient does not meet the SIRS or Sepsis criteria.  On repeat exam patient does not have a surgical abdomin and there are no peritoneal signs.  No indication of appendicitis, bowel obstruction, bowel perforation, cholecystitis, diverticulitis, PID, persistent/intermittent  torsion or ectopic pregnancy.  Patient discharged home with symptomatic treatment and given strict instructions for follow-up with their primary care physician.  I have also discussed reasons to return immediately to the ER.  Patient expresses understanding and agrees with plan.                                 Medical Decision Making Amount and/or Complexity of Data Reviewed External Data Reviewed: labs, radiology and notes. Labs: ordered. Decision-making details documented in ED Course.  Risk OTC drugs. Prescription drug management. Parenteral controlled substances. Decision regarding hospitalization. Diagnosis or treatment significantly limited by social determinants of health.          Final Clinical Impression(s) /  ED Diagnoses Final diagnoses:  Nausea and vomiting, unspecified vomiting type    Rx / DC Orders ED Discharge Orders          Ordered    dicyclomine (BENTYL) 20 MG tablet  2 times daily        09/26/23 2002    ondansetron (ZOFRAN-ODT) 4 MG disintegrating tablet  Every 8 hours PRN        09/26/23 2002              Lonie Rummell A, PA-C 09/26/23 2005    [place], DO 09/26/23 2055

## 2023-10-14 ENCOUNTER — Encounter (HOSPITAL_BASED_OUTPATIENT_CLINIC_OR_DEPARTMENT_OTHER): Payer: Self-pay | Admitting: Emergency Medicine

## 2023-10-14 ENCOUNTER — Emergency Department (HOSPITAL_BASED_OUTPATIENT_CLINIC_OR_DEPARTMENT_OTHER)
Admission: EM | Admit: 2023-10-14 | Discharge: 2023-10-14 | Disposition: A | Discharge status: Home / Self Care | Payer: 59 | Attending: Emergency Medicine | Admitting: Emergency Medicine

## 2023-10-14 ENCOUNTER — Other Ambulatory Visit: Payer: Self-pay

## 2023-10-14 DIAGNOSIS — X58XXXA Exposure to other specified factors, initial encounter: Secondary | ICD-10-CM | POA: Diagnosis not present

## 2023-10-14 DIAGNOSIS — S39012A Strain of muscle, fascia and tendon of lower back, initial encounter: Secondary | ICD-10-CM | POA: Diagnosis not present

## 2023-10-14 DIAGNOSIS — S3992XA Unspecified injury of lower back, initial encounter: Secondary | ICD-10-CM | POA: Diagnosis present

## 2023-10-14 LAB — URINALYSIS, ROUTINE W REFLEX MICROSCOPIC
Bilirubin Urine: NEGATIVE
Glucose, UA: NEGATIVE mg/dL
Hgb urine dipstick: NEGATIVE
Ketones, ur: NEGATIVE mg/dL
Leukocytes,Ua: NEGATIVE
Nitrite: NEGATIVE
Protein, ur: NEGATIVE mg/dL
Specific Gravity, Urine: 1.024 (ref 1.005–1.030)
pH: 6.5 (ref 5.0–8.0)

## 2023-10-14 LAB — PREGNANCY, URINE: Preg Test, Ur: NEGATIVE

## 2023-10-14 MED ORDER — LIDOCAINE 5 % EX PTCH: 1.0000 | MEDICATED_PATCH | CUTANEOUS | 0 refills | Status: DC

## 2023-10-14 MED ORDER — LIDOCAINE 5 % EX PTCH
1.0000 | MEDICATED_PATCH | Freq: Once | CUTANEOUS | Status: DC
  Administered 2023-10-14: 1 via TRANSDERMAL
  Filled 2023-10-14: qty 1

## 2023-10-14 MED ORDER — KETOROLAC TROMETHAMINE 30 MG/ML IJ SOLN
30.0000 mg | Freq: Once | INTRAMUSCULAR | Status: AC
Start: 2023-10-14 — End: 2023-10-14
  Administered 2023-10-14: 30 mg via INTRAMUSCULAR
  Filled 2023-10-14: qty 1

## 2023-10-14 MED ORDER — CYCLOBENZAPRINE HCL 10 MG PO TABS: 10.0000 mg | ORAL_TABLET | Freq: Two times a day (BID) | ORAL | 0 refills | Status: DC | PRN

## 2023-10-14 MED ORDER — ACETAMINOPHEN 500 MG PO TABS
1000.0000 mg | ORAL_TABLET | Freq: Once | ORAL | Status: AC
  Administered 2023-10-14: 1000 mg via ORAL
  Filled 2023-10-14: qty 2

## 2023-10-14 MED ORDER — CYCLOBENZAPRINE HCL 10 MG PO TABS
10.0000 mg | ORAL_TABLET | Freq: Once | ORAL | Status: AC
Start: 2023-10-14 — End: 2023-10-14
  Administered 2023-10-14: 10 mg via ORAL
  Filled 2023-10-14: qty 1

## 2023-10-14 NOTE — ED Triage Notes (Signed)
Pt ambulates into the ED today for back pain x2 days. Pt denies injury to back. Pt reports pain in lower back that intermittently radiates a shooting pain down her left leg. Pt states she has not taken anything for pain today, pt rates pain 8/10.

## 2023-10-14 NOTE — Discharge Instructions (Addendum)
You were seen in the emergency department for your back pain. You had no signs of urinary tract infection. You likely pulled a muscle in your back and are having back spasms. You can take Tylenol and Motrin every 6 hours as needed for pain. I have given you a muscle relaxer for break through pain. This can make you drowsy so I recommend taking before bed and do not take before driving, working or operating heavy machinery. You can also use lidocaine patches, ice or heat. You should stretch you back muscles to prevent them from getting more stiff but avoid lifting more than 10 lbs over the next few days to allow your back to heal. You can follow up with your primary care doctor in the next few days to have your symptoms rechecked. You should return to the ER if you have significantly worsening pain and are unable walk, you are unable to urinate, you have numbness or weakness in your legs or if you have any other new or concerning symptoms.

## 2023-10-14 NOTE — ED Provider Notes (Signed)
[place] EMERGENCY DEPARTMENT AT [facility] Provider Note   CSN: [id number] Arrival date & time: 10/14/23  6295     History  Chief Complaint  Patient presents with   Back Pain    [name] is a 32 y.o. female.  Patient is a 32 year old female with no significant past medical history presenting to the emergency department with back pain.  The patient states that she has had pain in her left lower back for the last 2 days.  States the pain will occasionally radiate down her left leg with some left leg tingling.  She denies any numbness or weakness.  She denies any trauma or falls, recent heavy lifting or repetitive activities.  States that she does walk up and down stairs a lot during work.  She denies any saddle anesthesia, loss of bowel or bladder function, dysuria or hematuria.  She denies any fever.  She states she has not taken anything for pain at home.  The history is provided by the patient.  Back Pain      Home Medications Prior to Admission medications   Medication Sig Start Date End Date Taking? Authorizing Provider  dicyclomine (BENTYL) 20 MG tablet Take 1 tablet (20 mg total) by mouth 2 (two) times daily. 09/26/23   Henderly, Britni A, PA-C  medroxyPROGESTERone (DEPO-PROVERA) 150 MG/ML injection INJECT 1 ML (150 MG TOTAL) INTO THE MUSCLE EVERY 3 (THREE) MONTHS 03/24/22   Brock Bad, MD  olmesartan (BENICAR) 20 MG tablet Take 1 tablet (20 mg total) by mouth daily. 05/20/23 06/19/23  Alveria Apley, NP  ondansetron (ZOFRAN-ODT) 4 MG disintegrating tablet Take 1 tablet (4 mg total) by mouth every 8 (eight) hours as needed. 09/26/23   Henderly, Britni A, PA-C  Vitamin D, Ergocalciferol, (DRISDOL) 1.25 MG (50000 UNIT) CAPS capsule Take 1 capsule (50,000 Units total) by mouth every 7 (seven) days. 04/21/23   Alveria Apley, NP      Allergies    Amlodipine    Review of Systems   Review of Systems  Musculoskeletal:  Positive for back pain.     Physical Exam Updated Vital Signs BP (!) 149/92   Pulse 75   Temp 98.7 F (37.1 C) (Oral)   Resp 16   Ht 5\' 9"  (1.753 m)   Wt 101.6 kg   SpO2 100%   BMI 33.08 kg/m  Physical Exam Vitals and nursing note reviewed.  Constitutional:      General: She is not in acute distress.    Appearance: Normal appearance.  HENT:     Head: Normocephalic and atraumatic.     Nose: Nose normal.     Mouth/Throat:     Mouth: Mucous membranes are moist.  Eyes:     Extraocular Movements: Extraocular movements intact.  Neck:     Comments: No midline neck tenderness Pulmonary:     Effort: Pulmonary effort is normal.  Abdominal:     General: Abdomen is flat.  Musculoskeletal:        General: Normal range of motion.     Cervical back: Normal range of motion and neck supple.     Comments: No midline back tenderness Left-sided lumbar paraspinal muscle tenderness to palpation without overlying skin changes Negative straight leg raise bilaterally  Skin:    General: Skin is warm and dry.  Neurological:     General: No focal deficit present.     Mental Status: She is alert and oriented to person, place, and time.  Sensory: No sensory deficit.     Motor: No weakness (5 out of 5 strength in bilateral hip flexion, knee extension and plantar/dorsi flexion).  Psychiatric:        Mood and Affect: Mood normal.        Behavior: Behavior normal.     ED Results / Procedures / Treatments   Labs (all labs ordered are listed, but only abnormal results are displayed) Labs Reviewed  PREGNANCY, URINE  URINALYSIS, ROUTINE W REFLEX MICROSCOPIC    EKG None  Radiology No results found.  Procedures Procedures    Medications Ordered in ED Medications  lidocaine (LIDODERM) 5 % 1-3 patch (1 patch Transdermal Patch Applied 10/14/23 0739)  acetaminophen (TYLENOL) tablet 1,000 mg (1,000 mg Oral Given 10/14/23 0735)  ketorolac (TORADOL) 30 MG/ML injection 30 mg (30 mg Intramuscular Given 10/14/23  0736)  cyclobenzaprine (FLEXERIL) tablet 10 mg (10 mg Oral Given 10/14/23 0735)    ED Course/ Medical Decision Making/ A&P Clinical Course as of 10/14/23 0827  Wed Oct 14, 2023  0826 Patient reports pain has improved.  She is able to ambulate steadily in the room.  She stable for discharge home with outpatient follow-up and was given strict return precautions. [VK]    Clinical Course User Index [VK] Rexford Maus, DO                                 Medical Decision Making This patient presents to the ED with chief complaint(s) of back pain with no pertinent past medical history which further complicates the presenting complaint. The complaint involves an extensive differential diagnosis and also carries with it a high risk of complications and morbidity.    The differential diagnosis includes muscle strain or spasm, sciatica, no urinary symptoms making UTI, pyelonephritis or nephrolithiasis unlikely, no significant trauma no midline back tenderness making fracture unlikely, no neurologic deficits, saddle anesthesia or loss of bowel or bladder function making cauda equina unlikely, no fever or recent spinal manipulation making epidural abscess unlikely  Additional history obtained: Additional history obtained from N/A Records reviewed Primary Care Documents  ED Course and Reassessment: On patient's arrival she is hemodynamically stable in no acute distress.  Was initially evaluated in triage and had urine performed.  Urinalysis was negative for infection and pregnancy test was negative.  Pain appears most consistent with musculoskeletal type pain and will be treated with Tylenol, Toradol, Flexeril and lidocaine patch and will be closely reassessed.  Independent labs interpretation:  The following labs were independently interpreted: UA negative  Independent visualization of imaging: - N/A  Consultation: - Consulted or discussed management/test interpretation w/ external  professional: N/A  Consideration for admission or further workup: Patient has no emergent conditions requiring admission or further work-up at this time and is stable for discharge home with primary care follow-up  Social Determinants of health: n/A    Risk OTC drugs. Prescription drug management.          Final Clinical Impression(s) / ED Diagnoses Final diagnoses:  Strain of lumbar region, initial encounter    Rx / DC Orders ED Discharge Orders     None         Rexford Maus, DO 10/14/23 1610

## 2023-10-14 NOTE — ED Notes (Signed)
Patient given discharge instructions. Questions were answered. Patient verbalized understanding of discharge instructions and care at home.  

## 2023-10-28 ENCOUNTER — Inpatient Hospital Stay (HOSPITAL_COMMUNITY)
Admission: AD | Admit: 2023-10-28 | Discharge: 2023-10-28 | Disposition: A | Discharge status: Home / Self Care | Payer: 59 | Attending: Obstetrics & Gynecology | Admitting: Obstetrics & Gynecology

## 2023-10-28 ENCOUNTER — Encounter (HOSPITAL_COMMUNITY): Payer: Self-pay | Admitting: Obstetrics & Gynecology

## 2023-10-28 DIAGNOSIS — N921 Excessive and frequent menstruation with irregular cycle: Secondary | ICD-10-CM | POA: Diagnosis not present

## 2023-10-28 MED ORDER — NORGESTIMATE-ETH ESTRADIOL 0.25-35 MG-MCG PO TABS: 1.0000 | ORAL_TABLET | Freq: Every day | ORAL | 2 refills | Status: DC

## 2023-10-28 NOTE — MAU Note (Signed)
..  [name] is a 33 y.o. at Unknown here in MAU reporting: vaginal bleeding since Sunday, reports 3-4 pads every hour since her bleeding started. Reports period-like cramping and pressure in her bottom.  Had her last Depo shot on 08/18/2023. Due for next dose on 11/09/2023.  Pain score: 1/10 Vitals:   10/28/23 1941  BP: 125/83  Pulse: 71  Resp: 16  Temp: 98.2 F (36.8 C)  SpO2: 100%

## 2023-10-28 NOTE — MAU Provider Note (Signed)
 Chief Complaint: Vaginal Bleeding   Event Date/Time   First Provider Initiated Contact with Patient 10/28/23 1959      SUBJECTIVE HPI: [name] is a 33 y.o. not pregnant patient presents for heavy bleeding on Depo Provera . She reports being on Depo for contraception since 2012 and has not had a period x 3 years. This bleeding started 3 days ago and was heavy enough to soak 3-4 pads per hour but has slowed and is intermittent now. She denies dizziness, SOB, h/a, or chest pain.     HPI  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HCC)    Hypertension    Migraine    Right side   Prediabetes    Vaginal Pap smear, abnormal    Vitamin D  deficiency    Past Surgical History:  Procedure Laterality Date   MASS EXCISION N/A 09/13/2013   Procedure: EXCISION OF SOFT TISSUE MASS ON BACK;  Surgeon: Earlis Ranks, MD;  Location: [facility];  Service: Plastics;  Laterality: N/A;   none     WISDOM TOOTH EXTRACTION     Social History   Socioeconomic History   Marital status: Single    Spouse name: Not on file   Number of children: 2   Years of education: 12   Highest education level: High school graduate  Occupational History    Comment: n/a  Tobacco Use   Smoking status: Never   Smokeless tobacco: Never  Vaping Use   Vaping status: Never Used  Substance and Sexual Activity   Alcohol use: Yes    Comment: occasionally   Drug use: No   Sexual activity: Yes    Birth control/protection: Injection  Other Topics Concern   Not on file  Social History Narrative   Not on file   Social Drivers of Health   Financial Resource Strain: High Risk (04/17/2023)   Overall Financial Resource Strain (CARDIA)    Difficulty of Paying Living Expenses: Very hard  Food Insecurity: No Food Insecurity (04/17/2023)   Hunger Vital Sign    Worried About Running Out of Food in the Last Year: Never true    Ran Out of Food in the Last Year: Never true   Transportation Needs: No Transportation Needs (04/17/2023)   PRAPARE - Administrator, Civil Service (Medical): No    Lack of Transportation (Non-Medical): No  Physical Activity: Inactive (04/17/2023)   Exercise Vital Sign    Days of Exercise per Week: 0 days    Minutes of Exercise per Session: 0 min  Stress: No Stress Concern Present (04/17/2023)   [employer] of Occupational Health - Occupational Stress Questionnaire    Feeling of Stress : Not at all  Social Connections: Socially Isolated (04/20/2023)   Social Connection and Isolation Panel [NHANES]    Frequency of Communication with Friends and Family: Three times a week    Frequency of Social Gatherings with Friends and Family: Three times a week    Attends Religious Services: Never    Active Member of Clubs or Organizations: No    Attends Banker Meetings: Never    Marital Status: Never married  Intimate Partner Violence: Not At Risk (04/17/2023)   Humiliation, Afraid, Rape, and Kick questionnaire    Fear of Current or Ex-Partner: No    Emotionally Abused: No    Physically Abused: No    Sexually Abused: No   No current facility-administered medications on file prior to encounter.  Current Outpatient Medications on File Prior to Encounter  Medication Sig Dispense Refill   cyclobenzaprine  (FLEXERIL ) 10 MG tablet Take 1 tablet (10 mg total) by mouth 2 (two) times daily as needed for muscle spasms. 20 tablet 0   dicyclomine  (BENTYL ) 20 MG tablet Take 1 tablet (20 mg total) by mouth 2 (two) times daily. 20 tablet 0   lidocaine  (LIDODERM ) 5 % Place 1 patch onto the skin daily. Remove & Discard patch within 12 hours or as directed by MD 30 patch 0   medroxyPROGESTERone  (DEPO-PROVERA ) 150 MG/ML injection INJECT 1 ML (150 MG TOTAL) INTO THE MUSCLE EVERY 3 (THREE) MONTHS 1 mL 3   olmesartan  (BENICAR ) 20 MG tablet Take 1 tablet (20 mg total) by mouth daily. 30 tablet 0   ondansetron  (ZOFRAN -ODT) 4 MG  disintegrating tablet Take 1 tablet (4 mg total) by mouth every 8 (eight) hours as needed. 20 tablet 0   Vitamin D , Ergocalciferol , (DRISDOL ) 1.25 MG (50000 UNIT) CAPS capsule Take 1 capsule (50,000 Units total) by mouth every 7 (seven) days. 12 capsule 0   Allergies  Allergen Reactions   Amlodipine  Nausea And Vomiting    ROS:  Review of Systems  Constitutional:  Negative for chills, fatigue and fever.  Respiratory:  Negative for shortness of breath.   Cardiovascular:  Negative for chest pain.  Genitourinary:  Positive for vaginal bleeding. Negative for difficulty urinating, dysuria, flank pain, pelvic pain, vaginal discharge and vaginal pain.  Neurological:  Negative for dizziness and headaches.  Psychiatric/Behavioral: Negative.       I have reviewed patient's Past Medical Hx, Surgical Hx, Family Hx, Social Hx, medications and allergies.   Physical Exam  Patient Vitals for the past 24 hrs:  BP Temp Temp src Pulse Resp SpO2 Height Weight  10/28/23 1941 125/83 98.2 F (36.8 C) Oral 71 16 100 % 5' 9 (1.753 m) 101.6 kg   Constitutional: Well-developed, well-nourished female in no acute distress.  Cardiovascular: normal rate Respiratory: normal effort GI: Abd soft, non-tender. Pos BS x 4 MS: Extremities nontender, no edema, normal ROM Neurologic: Alert and oriented x 4.  GU: Neg CVAT.  PELVIC EXAM: Deferred    LAB RESULTS No results found for this or any previous visit (from the past 24 hours).     IMAGING No results found.  MAU Management/MDM: Orders Placed This Encounter  Procedures   Discharge patient    Meds ordered this encounter  Medications   norgestimate -ethinyl estradiol  (ORTHO-CYCLEN) 0.25-35 MG-MCG tablet    Sig: Take 1 tablet by mouth daily.    Dispense:  28 tablet    Refill:  2    Supervising Provider:   EVELINE LYNWOOD MATSU [[id number]]    Pt stable, without s/sx of anemia. Bleeding has slowed since onset.  Rx for OCPs sent to pharmacy.  Message sent for  f/u appt in the office in 2 months.  Return precautions reviewed.     ASSESSMENT 1. Breakthrough bleeding on depo provera      PLAN Discharge home Allergies as of 10/28/2023       Reactions   Amlodipine  Nausea And Vomiting        Medication List     TAKE these medications    cyclobenzaprine  10 MG tablet Commonly known as: FLEXERIL  Take 1 tablet (10 mg total) by mouth 2 (two) times daily as needed for muscle spasms.   dicyclomine  20 MG tablet Commonly known as: BENTYL  Take 1 tablet (20 mg total) by mouth 2 (two) times  daily.   lidocaine  5 % Commonly known as: Lidoderm  Place 1 patch onto the skin daily. Remove & Discard patch within 12 hours or as directed by MD   medroxyPROGESTERone  150 MG/ML injection Commonly known as: DEPO-PROVERA  INJECT 1 ML (150 MG TOTAL) INTO THE MUSCLE EVERY 3 (THREE) MONTHS   norgestimate -ethinyl estradiol  0.25-35 MG-MCG tablet Commonly known as: ORTHO-CYCLEN Take 1 tablet by mouth daily.   olmesartan  20 MG tablet Commonly known as: BENICAR  Take 1 tablet (20 mg total) by mouth daily.   ondansetron  4 MG disintegrating tablet Commonly known as: ZOFRAN -ODT Take 1 tablet (4 mg total) by mouth every 8 (eight) hours as needed.   Vitamin D  (Ergocalciferol ) 1.25 MG (50000 UNIT) Caps capsule Commonly known as: DRISDOL  Take 1 capsule (50,000 Units total) by mouth every 7 (seven) days.        Follow-up Information     [facility] Follow up.   Specialty: Obstetrics and Gynecology Why: The clinic will call you with appointment. Contact information: [address] [place]  [address] [phone]        [facility] Follow up.   Specialty: Obstetrics and Gynecology Why: As needed for emergencies Contact information: [address] [place]  [address] [phone]                Olam Boards Certified  Nurse-Midwife 10/28/2023  8:26 PM

## 2023-11-05 ENCOUNTER — Other Ambulatory Visit: Payer: Self-pay

## 2023-11-05 DIAGNOSIS — Z3042 Encounter for surveillance of injectable contraceptive: Secondary | ICD-10-CM

## 2023-11-05 MED ORDER — MEDROXYPROGESTERONE ACETATE 150 MG/ML IM SUSP: 150.0000 mg | INTRAMUSCULAR | 3 refills | Status: DC

## 2023-11-05 NOTE — Progress Notes (Signed)
TC to pt. Requesting depo for visit on 1/20, pt has appt scheduled with Misty Stanley in February. RX sent.

## 2023-11-06 ENCOUNTER — Encounter: Payer: Self-pay | Admitting: Obstetrics and Gynecology

## 2023-11-09 ENCOUNTER — Ambulatory Visit: Payer: PRIVATE HEALTH INSURANCE

## 2023-11-09 ENCOUNTER — Encounter: Payer: Self-pay | Admitting: Obstetrics and Gynecology

## 2023-11-09 ENCOUNTER — Ambulatory Visit (HOSPITAL_BASED_OUTPATIENT_CLINIC_OR_DEPARTMENT_OTHER)
Admission: RE | Admit: 2023-11-09 | Discharge: 2023-11-09 | Disposition: A | Discharge status: Home / Self Care | Payer: 59 | Source: Ambulatory Visit | Attending: Obstetrics and Gynecology | Admitting: Obstetrics and Gynecology

## 2023-11-09 ENCOUNTER — Ambulatory Visit: Payer: Medicaid Other | Admitting: Obstetrics and Gynecology

## 2023-11-09 VITALS — BP 118/79 | HR 85 | Wt 225.0 lb

## 2023-11-09 DIAGNOSIS — Z30013 Encounter for initial prescription of injectable contraceptive: Secondary | ICD-10-CM | POA: Diagnosis not present

## 2023-11-09 DIAGNOSIS — N939 Abnormal uterine and vaginal bleeding, unspecified: Secondary | ICD-10-CM

## 2023-11-09 NOTE — Patient Instructions (Signed)
Endometrial Ablation: What to Expect An endometrial ablation is a procedure that removes the thin layer of the lining of the uterus. This lining is called the endometrium. This procedure may be done to: Stop heavy periods that can cause low red blood cell count (anemia). Try to stop irregular bleeding from your vagina. Treat bleeding that's caused by small tumors (fibroids) in the uterus. After this procedure, you may not be able to have children. Tell a health care provider about: Any allergies you have. All medicines you're taking. These include vitamins, herbs, eye drops, creams, and over-the-counter medicines. Any problems you or family members have had with anesthesia. Any bleeding problems you have. Any surgeries you have had. Any medical conditions you have. Whether you're pregnant or may be pregnant. What are the risks? Your health care provider will talk with you about risks. These may include: Bleeding. Infection. Allergic reactions to medicines. Injury to the uterus, bowel, or nearby structures. An air bubble in the lung (air embolus). Problems with pregnancy. Scar tissue. What happens before the procedure? Medicines Ask about changing or stopping: Any medicines you take. Any vitamins, herbs, or supplements you take. Do not take aspirin or ibuprofen unless you're told to. Tests You will have tests of your uterus to make sure that there's no cancer. You may have an ultrasound of the uterus. Surgery safety For your safety, you may: Need to wash your skin with a soap that kills germs. Get antibiotics. Have hair removed at the procedure site. General instructions Do not smoke, vape, or use nicotine or tobacco. You may be given medicines to thin the lining of the uterus. If you'll be going home right after the procedure, plan to have a responsible adult: Drive you home from the hospital or clinic. You won't be allowed to drive. Stay with you for the time you're  told. What happens during the procedure?  You will lie on an exam table with your feet and legs supported. An IV will be inserted into one of your veins. You will be given a sedative to help you relax. A surgical tool with a light and camera (resectoscope) will be inserted into your vagina and moved into your uterus. This allows your surgeon to see inside your uterus. The lining of your uterus will be taken out. Your surgeon will use one of these methods: Radiofrequency. This uses an electric current to destroy the lining of the uterus. Cryotherapy. This uses something very cold to freeze the lining of the uterus. Heated fluid. This uses very hot salt and water solution (saline) solution to destroy the lining of the uterus. Microwave. This uses high-energy waves to heat up the lining of the uterus and destroy it. Thermal balloon. A balloon filled with heated fluid is inserted into the uterus. The heated fluid destroys the lining of the uterus. These steps may vary. Ask what you can expect. What happens after the procedure? You may be watched closely until you leave. This includes checking your pain level, blood pressure, heart rate, and breathing rate. If you were given a sedative, do not drive or use machines until you're told it's safe. A sedative can make you sleepy. This information is not intended to replace advice given to you by your health care provider. Make sure you discuss any questions you have with your health care provider. Document Revised: 03/24/2023 Document Reviewed: 03/24/2023 Elsevier Patient Education  2024 Else[employer] Inc.  Hysterectomy Information  A hysterectomy is a surgery to take out the  uterus. The uterus is where a baby grows when a person is pregnant. Other organs may also be taken out. They are: The organs that make eggs (ovaries). The tubes that move the egg to the uterus (fallopian tubes). The lowest part of the uterus (cervix). After the surgery, you'll no  longer have your period. You'll not be able to get pregnant. This surgery can affect the way you feel. Talk with your health care provider about the physical and emotional effects of this surgery. Why is a hysterectomy done? This surgery may be done if: You have growths in the uterus, such as fibroids. This is the most common reason. The lining of the uterus grows outside the uterus. Your uterus has moved down and bulges down into the vagina. You have abnormal or heavy bleeding during your period. You have pain in the pelvic area, and the pain lasts a long time. You have cancer of the uterus or cervix. What are the risks of hysterectomy? Your provider will talk with you about risks. These may include: Bleeding or blood clots in the legs or lung. Infection. Injury to areas close to the uterus. These include the nerves, bladder, or bowel. Reaction to the medicines used. Early symptoms of menopause, if both ovaries are taken out. These include hot flashes, vaginal dryness, night sweats, and lack of sleep. What can be taken out during a hysterectomy? This surgery can be done: To take out the top part of the uterus only. The cervix is not taken out. To take out the uterus and cervix. To take out the uterus, the cervix, and the tissue that holds the uterus. In some cases, your ovaries may also be taken out. The parts that will be taken out are based on the reason why you need this surgery. What are types of this surgery? The uterus can be taken out in many ways. Talk with your provider about the best surgery for you.  Here are the ones that are done most often. There are benefits and risks for each. Abdominal hysterectomy One big cut is made in your belly. The uterus and other organs are taken out through this cut. This method is used: When your provider wants a better way to get to your uterus. If you have scars inside your belly that stick to other parts or organs. These are called  adhesions. If you have this surgery: You may take longer to get well because of the big cut in your belly. There's a higher risk of injury to other tissues and organs. You may get adhesions. Vaginal hysterectomy Cuts are made in the top of the vagina. No cuts are made on your skin. The uterus is taken out through the vagina. If you have this surgery: You may have a shorter stay in the hospital. There's a lower risk of getting adhesions. You may have fewer problems after surgery. Laparoscopic-assisted vaginal hysterectomy (LAVH) Many small cuts are made in your belly and inside your vagina. LAVH is done with long, thin tools and cameras. Robots are also used. LAVH takes longer. There's also higher risk of injury to other organs. But there's less bleeding. If you have LAVH: There's less risk that germs will get into your body. You'll have a short stay in the hospital. You'll go back to your normal activities sooner. What happens after the surgery? You will be given pain medicine. You may need to stay in the hospital for 1-2 days. You may need to have an adult stay  with you for a few days after you go home. You will not be able to lift heavy objects. You will not be able to put anything in your vagina for the first 6 weeks or for the time told by your provider. This includes douching, having sex, and using tampons. If your ovaries were taken out, you may get hot flashes. You may also have night sweats, vaginal dryness, and trouble sleeping. Questions to ask your health care provider Is this surgery needed? What other options do I have? What organs need to be taken out? How long will I need to stay in the hospital? How long will I need to get better at home? What symptoms can I expect after the surgery? This information is not intended to replace advice given to you by your health care provider. Make sure you discuss any questions you have with your health care provider. Document  Revised: 07/09/2023 Document Reviewed: 01/23/2023 Elsevier Patient Education  2024 [employer].

## 2023-11-09 NOTE — Progress Notes (Signed)
33 yo P2 with LMP 10/26/2023 and BMI 33 presenting for the evaluation of AUB. Patient has been using depo-provera for cycle control since 2012. She reports amenorrhea most of the time but this past month experienced a heavy episode of vaginal bleeding saturating 3-4 pad every hour. Patient was prescribed COC to manage breakthrough bleeding and admits to being forgetful. Patient reports some cramping pain with heavy bleeding. She is in a same sex relationship. Patient does not want any more children. She is interested in a hysterectomy. 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 Patient also states that her mother was her age when she had a hysterectomy for dysmenorrhea and several other women in her family have had hysterectomies. Patient is without any other  complaints  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HCC)    Hypertension    Migraine    Right side   Prediabetes    Vaginal Pap smear, abnormal    Vitamin D deficiency    Past Surgical History:  Procedure Laterality Date   MASS EXCISION N/A 09/13/2013   Procedure: EXCISION OF SOFT TISSUE MASS ON BACK;  Surgeon: Glenna Fellows, MD;  Location: [facility];  Service: Plastics;  Laterality: N/A;   none     WISDOM TOOTH EXTRACTION     Family History  Problem Relation Age of Onset   Hypertension Mother    ADD / ADHD Brother    ADD / ADHD Daughter    Dementia Maternal Grandmother    Heart failure Maternal Grandfather    Social History   Tobacco Use   Smoking status: Never   Smokeless tobacco: Never  Vaping Use   Vaping status: Never Used  Substance Use Topics   Alcohol use: Yes    Comment: occasionally   Drug use: No   ROS See pertinent in HPI. All other systems reviewed and non contributory Blood pressure 118/79, pulse 85, weight 225 lb (102.1 kg), last menstrual period 10/26/2023. GENERAL: Well-developed, well-nourished female in no acute  distress.  NEURO: alert and oriented x 3  A/P 33 yo P2 with AUB on depo-provera - Discussed medical management with Mirena IUD. Patient is not interested in continuing with hormonal contraception - Discussed surgical management with endometrial ablation- information provided - Pelvic ultrasound ordered - Patient strongly desires hysterectomy - Normal pap smear 2022 - Management option to be determined pending ultrasound. Patient opted to receive her depo-provera today

## 2023-11-09 NOTE — Progress Notes (Signed)
Pt is in office to discuss bleeding- heavy.  Pt states using 3-4 pads/hour.  Pt has been on Depo for years, now taking pills to help with bleeding.   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

## 2023-11-17 ENCOUNTER — Encounter: Payer: Self-pay | Admitting: Obstetrics and Gynecology

## 2023-11-18 ENCOUNTER — Encounter: Payer: Self-pay | Admitting: Obstetrics and Gynecology

## 2023-11-26 ENCOUNTER — Ambulatory Visit: Payer: Medicaid Other | Admitting: Obstetrics and Gynecology

## 2023-11-26 VITALS — BP 136/84 | HR 83 | Wt 227.5 lb

## 2023-11-26 DIAGNOSIS — N939 Abnormal uterine and vaginal bleeding, unspecified: Secondary | ICD-10-CM

## 2023-11-26 NOTE — Progress Notes (Signed)
 Pt. Presents for hysterectomy consult.

## 2023-11-26 NOTE — Progress Notes (Signed)
 GYNECOLOGY VISIT  Patient name: [name] MRN [id number]  Date of birth: 1991-07-11 Chief Complaint:   Follow-up (Consult)   History:  [name] is a 33 y.o. [phone] being seen today for hysterectormy.  Has been on depo for years. Started on pill for the breakthrough bleeding - takes the pill at night and every time she take the pill, she will get headache. Started depo for birth control and started after her first delivery. Menses aren't too heavy but it was the cramping that is most bothersome. Continued on the birth control for the menstrual relief - current same sex realtionship. The primary issue was the pain and heavy bleeding has been more recent. 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HCC)    Hypertension    Migraine    Right side   Prediabetes    Vaginal Pap smear, abnormal    Vitamin D  deficiency     Past Surgical History:  Procedure Laterality Date   MASS EXCISION N/A 09/13/2013   Procedure: EXCISION OF SOFT TISSUE MASS ON BACK;  Surgeon: Earlis Ranks, MD;  Location: [facility];  Service: Plastics;  Laterality: N/A;   none     WISDOM TOOTH EXTRACTION      The following portions of the patient's history were reviewed and updated as appropriate: allergies, current medications, past family history, past medical history, past social history, past surgical history and problem list.   Health Maintenance:   Last pap     Component Value Date/Time   DIAGPAP  01/15/2021 1019    - Negative for intraepithelial lesion or malignancy (NILM)   DIAGPAP  09/01/2017 0000    NEGATIVE FOR INTRAEPITHELIAL LESIONS OR MALIGNANCY.   DIAGPAP  08/07/2016 0000    NEGATIVE FOR INTRAEPITHELIAL LESIONS OR MALIGNANCY.   DIAGPAP SHIFT IN FLORA SUGGESTIVE OF BACTERIAL VAGINOSIS. 08/07/2016 0000   HPVHIGH Negative 01/15/2021 1019    ADEQPAP  01/15/2021 1019    Satisfactory for evaluation; transformation zone component PRESENT.   ADEQPAP  09/01/2017 0000    Satisfactory for evaluation  endocervical/transformation zone component PRESENT.   ADEQPAP  08/07/2016 0000    Satisfactory for evaluation  endocervical/transformation zone component PRESENT.    High Risk HPV: Positive  Adequacy:  Satisfactory for evaluation, transformation zone component PRESENT  Diagnosis:  Atypical squamous cells of undetermined significance (ASC-US )  Last mammogram: n/a   Review of Systems:  Pertinent items are noted in HPI. Comprehensive review of systems was otherwise negative.   Objective:  Physical Exam BP 136/84   Pulse 83   Wt 227 lb 8 oz (103.2 kg)   LMP 10/26/2023 (Exact Date)   BMI 33.60 kg/m    Physical Exam   Labs and Imaging US  PELVIC COMPLETE WITH TRANSVAGINAL Result Date: 11/09/2023 CLINICAL DATA:  Abnormal uterine bleeding EXAM: TRANSABDOMINAL AND TRANSVAGINAL ULTRASOUND OF PELVIS TECHNIQUE: Both transabdominal and transvaginal ultrasound examinations of the pelvis were performed. Transabdominal technique was performed for global imaging of the pelvis including uterus, ovaries, adnexal regions, and pelvic cul-de-sac. It was necessary to proceed with endovaginal exam following the transabdominal exam to visualize the endometrium. COMPARISON:  None Available. FINDINGS: Uterus Measurements: 8.3 x 4.5 x 5.4 cm = volume: 105.2 mL. No fibroids or other mass visualized. Endometrium Thickness: 3.9 mm.  Small amount of fluid within the endometrium. Right ovary Measurements:  3.4 x 2.9 x 2.8 cm = volume: 14 mL. Normal appearance/no adnexal mass. Left ovary Measurements: 3.1 x 2.3 x 1.4 cm = volume: 5.2 mL. Normal appearance/no adnexal mass. Other findings No abnormal free fluid. IMPRESSION: Endometrium measures 4 mm. If bleeding remains unresponsive to hormonal or medical therapy, sonohysterogram should be considered for focal  lesion work-up. (Ref: Radiological Reasoning: Algorithmic Workup of Abnormal Vaginal Bleeding with Endovaginal Sonography and Sonohysterography. AJR 2008; [id number]) Electronically Signed   By: Bard Moats M.D.   On: 11/09/2023 19:31       Assessment & Plan:   1. Abnormal uterine bleeding (AUB) (Primary) Would like to proceed with definitive surgical management of dysmenorrhea and abnormal uterine bleeding. Will return for preop EMB. Hysterectomy consent signed today.  - Ambulatory Referral For Surgery Scheduling  Patient desires surgical management with TLH, BS, cysto.  The risks of surgery were discussed in detail with the patient including but not limited to: bleeding which may require transfusion or reoperation; infection which may require prolonged hospitalization or re-hospitalization and antibiotic therapy; injury to bowel, bladder, ureters and major vessels or other surrounding organs which may lead to other procedures; formation of adhesions; need for additional procedures including laparotomy or subsequent procedures secondary to intraoperative injury or abnormal pathology; thromboembolic phenomenon; incisional problems and other postoperative or anesthesia complications.  Patient was told that the likelihood that her condition and symptoms will be treated effectively with this surgical management was high; the postoperative expectations were also discussed in detail. The patient also understands the alternative treatment options which were discussed in full. All questions were answered.  She was told that she will be contacted by our surgical scheduler regarding the time and date of her surgery; routine preoperative instructions will be given to her by the preoperative nursing team.    Printed patient education handouts about the procedure were given to the patient to review at home.   Routine preventative health maintenance measures emphasized.  [name], MD Minimally Invasive  Gynecologic Surgery Center for [facility], [facility]

## 2023-12-10 ENCOUNTER — Encounter: Payer: Self-pay | Admitting: Advanced Practice Midwife

## 2023-12-11 ENCOUNTER — Other Ambulatory Visit: Payer: Self-pay

## 2023-12-11 MED ORDER — NORGESTIMATE-ETH ESTRADIOL 0.25-35 MG-MCG PO TABS: 1.0000 | ORAL_TABLET | Freq: Every day | ORAL | 0 refills | Status: DC

## 2023-12-16 ENCOUNTER — Ambulatory Visit: Payer: 59 | Admitting: Advanced Practice Midwife

## 2023-12-24 ENCOUNTER — Other Ambulatory Visit: Payer: Medicaid Other | Admitting: Obstetrics and Gynecology

## 2023-12-29 ENCOUNTER — Encounter: Payer: Self-pay | Admitting: Obstetrics and Gynecology

## 2023-12-29 ENCOUNTER — Other Ambulatory Visit (HOSPITAL_COMMUNITY)
Admission: RE | Admit: 2023-12-29 | Discharge: 2023-12-29 | Disposition: A | Discharge status: Home / Self Care | Source: Ambulatory Visit | Attending: Obstetrics and Gynecology | Admitting: Obstetrics and Gynecology

## 2023-12-29 ENCOUNTER — Ambulatory Visit: Payer: Medicaid Other | Admitting: Obstetrics and Gynecology

## 2023-12-29 VITALS — BP 119/77 | HR 86 | Ht 69.0 in | Wt 227.0 lb

## 2023-12-29 DIAGNOSIS — N858 Other specified noninflammatory disorders of uterus: Secondary | ICD-10-CM | POA: Diagnosis not present

## 2023-12-29 DIAGNOSIS — Z3202 Encounter for pregnancy test, result negative: Secondary | ICD-10-CM

## 2023-12-29 DIAGNOSIS — N939 Abnormal uterine and vaginal bleeding, unspecified: Secondary | ICD-10-CM

## 2023-12-29 LAB — POCT URINE PREGNANCY: Preg Test, Ur: NEGATIVE

## 2023-12-29 NOTE — Progress Notes (Signed)
 GYNECOLOGY VISIT  Patient name: [name] MRN [id number]  Date of birth: June 29, 1991 Chief Complaint:   ENDOMETRIAL BIOPSY   History:  [name] is a 33 y.o. G2P2002 being seen today for EMB.    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HCC)    Hypertension    Migraine    Right side   Prediabetes    Vaginal Pap smear, abnormal    Vitamin D deficiency     Past Surgical History:  Procedure Laterality Date   MASS EXCISION N/A 09/13/2013   Procedure: EXCISION OF SOFT TISSUE MASS ON BACK;  Surgeon: Glenna Fellows, MD;  Location: [facility];  Service: Plastics;  Laterality: N/A;   none     WISDOM TOOTH EXTRACTION      The following portions of the patient's history were reviewed and updated as appropriate: allergies, current medications, past family history, past medical history, past social history, past surgical history and problem list.   Health Maintenance:   Last pap     Component Value Date/Time   DIAGPAP  01/15/2021 1019    - Negative for intraepithelial lesion or malignancy (NILM)   DIAGPAP  09/01/2017 0000    NEGATIVE FOR INTRAEPITHELIAL LESIONS OR MALIGNANCY.   DIAGPAP  08/07/2016 0000    NEGATIVE FOR INTRAEPITHELIAL LESIONS OR MALIGNANCY.   DIAGPAP SHIFT IN FLORA SUGGESTIVE OF BACTERIAL VAGINOSIS. 08/07/2016 0000   HPVHIGH Negative 01/15/2021 1019   ADEQPAP  01/15/2021 1019    Satisfactory for evaluation; transformation zone component PRESENT.   ADEQPAP  09/01/2017 0000    Satisfactory for evaluation  endocervical/transformation zone component PRESENT.   ADEQPAP  08/07/2016 0000    Satisfactory for evaluation  endocervical/transformation zone component PRESENT.    High Risk HPV: Positive  Adequacy:  Satisfactory for evaluation, transformation zone component PRESENT  Diagnosis:  Atypical squamous cells of undetermined significance (ASC-US)  Last mammogram: n/a   Review of Systems:  Pertinent  items are noted in HPI. Comprehensive review of systems was otherwise negative.   Objective:  Physical Exam BP 119/77   Pulse 86   Ht 5\' 9"  (1.753 m)   Wt 227 lb (103 kg)   LMP 11/24/2023 (Approximate)   BMI 33.52 kg/m    Physical Exam Vitals and nursing note reviewed. Exam conducted with a chaperone present.  Constitutional:      Appearance: Normal appearance.  HENT:     Head: Normocephalic and atraumatic.  Pulmonary:     Effort: Pulmonary effort is normal.     Breath sounds: Normal breath sounds.  Genitourinary:    General: Normal vulva.     Exam position: Lithotomy position.     Vagina: Normal.     Cervix: Normal.  Skin:    General: Skin is warm and dry.  Neurological:     General: No focal deficit present.     Mental Status: She is alert.  Psychiatric:        Mood and Affect: Mood normal.        Behavior: Behavior normal.        Thought Content: Thought content normal.        Judgment: Judgment normal.     Endometrial Biopsy Procedure  Patient identified, informed consent performed,  indication reviewed, consent signed.  Reviewed risk of perforation, pain, bleeding, insufficient sample, etc were reviewd. Time out was performed.  Urine pregnancy test negative.  Speculum placed in the vagina.  Cervix visualized.  Cleaned with Betadine x 2.   Paracervical block was not administered.  Endometrial pipelle was used to draw up 1cc of 1% lidocaine, introduced into the cervical os and instilled into the endometrial cavity.  The pipelle was passed twice without difficulty and sample obtained. Tenaculum was removed, good hemostasis noted.  Patient tolerated procedure well.  Patient was given post-procedure instructions.      Assessment & Plan:   1. Abnormal uterine bleeding (AUB) (Primary) Now s/p uncomplicated EMB. Hysterectomy consent signed and in chart. Will be scheduled for TLH, BS, cysto for AUB and dysmenorrhea.  - POCT urine pregnancy - Surgical  pathology   Routine preventative health maintenance measures emphasized.  Lorriane Shire, MD Minimally Invasive Gynecologic Surgery Center for [facility], [facility]

## 2023-12-29 NOTE — Progress Notes (Signed)
 33 y.o. GYN presents for ENOD Bx   UPT Negative

## 2023-12-31 ENCOUNTER — Encounter: Payer: Self-pay | Admitting: Obstetrics and Gynecology

## 2023-12-31 DIAGNOSIS — N939 Abnormal uterine and vaginal bleeding, unspecified: Secondary | ICD-10-CM

## 2023-12-31 LAB — SURGICAL PATHOLOGY

## 2024-01-04 ENCOUNTER — Other Ambulatory Visit: Payer: Self-pay | Admitting: Obstetrics and Gynecology

## 2024-01-12 DIAGNOSIS — M25562 Pain in left knee: Secondary | ICD-10-CM | POA: Diagnosis not present

## 2024-01-29 ENCOUNTER — Telehealth: Payer: Self-pay

## 2024-01-29 NOTE — Telephone Encounter (Signed)
 I called patient to schedule procedure w/ Dr. Briscoe Deutscher on 03/22/24 at 7:30 am @[facility]. Patient confirmed she was available and will arrive at 5:30 am. I provided pre-op instructions and surgery details over the phone. Written details will be sent to patient's Mychart acct.

## 2024-02-01 ENCOUNTER — Encounter: Payer: Self-pay | Admitting: Obstetrics and Gynecology

## 2024-02-01 DIAGNOSIS — N921 Excessive and frequent menstruation with irregular cycle: Secondary | ICD-10-CM

## 2024-02-01 MED ORDER — TRANEXAMIC ACID 650 MG PO TABS: 1300.0000 mg | ORAL_TABLET | Freq: Three times a day (TID) | ORAL | 2 refills | Status: DC

## 2024-02-02 MED ORDER — NORGESTIMATE-ETH ESTRADIOL 0.25-35 MG-MCG PO TABS: 1.0000 | ORAL_TABLET | Freq: Every day | ORAL | 3 refills | Status: DC

## 2024-02-04 ENCOUNTER — Encounter: Payer: Self-pay | Admitting: Obstetrics and Gynecology

## 2024-02-26 ENCOUNTER — Encounter: Payer: Self-pay | Admitting: Obstetrics and Gynecology

## 2024-02-26 ENCOUNTER — Telehealth (INDEPENDENT_AMBULATORY_CARE_PROVIDER_SITE_OTHER): Admitting: Obstetrics and Gynecology

## 2024-02-26 VITALS — Wt 223.0 lb

## 2024-02-26 DIAGNOSIS — N939 Abnormal uterine and vaginal bleeding, unspecified: Secondary | ICD-10-CM

## 2024-02-26 NOTE — Progress Notes (Signed)
 Has questions about an ablation.  Currently scheduled for a hysterectomy next month.   No other concerns at this time.

## 2024-02-26 NOTE — Progress Notes (Signed)
 GYNECOLOGY VIRTUAL VISIT ENCOUNTER NOTE  Provider location: Center for [facility] at [facility]   Patient location: Home  I connected with Vincente Green on 02/26/24 at 11:15 AM EDT by MyChart Video Encounter and verified that I am speaking with the correct person using two identifiers.   I discussed the limitations, risks, security and privacy concerns of performing an evaluation and management service virtually and the availability of in person appointments. I also discussed with the patient that there may be a patient responsible charge related to this service. The patient expressed understanding and agreed to proceed.   History:  [name] is a 33 y.o. G53P2002 female being evaluated today for discussion of endometrial ablation instead of hysterectomy. Has been taking medication. Took the TXA for the 5 days and then resumed the birth control. Bleeding seems to be triggered by moments of heightened stress or emotion. Not able to take prolonged time off work for hysterectomy and interested in having ablation instead.      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HCC)    Hypertension    Migraine    Right side   Prediabetes    Vaginal Pap smear, abnormal    Vitamin D  deficiency    Past Surgical History:  Procedure Laterality Date   MASS EXCISION N/A 09/13/2013   Procedure: EXCISION OF SOFT TISSUE MASS ON BACK;  Surgeon: Alger Infield, MD;  Location: [facility];  Service: Plastics;  Laterality: N/A;   none     WISDOM TOOTH EXTRACTION     The following portions of the patient's history were reviewed and updated as appropriate: allergies, current medications, past family history, past medical history, past social history, past surgical history and problem list.   Health Maintenance:      Component Value Date/Time   DIAGPAP  01/15/2021 1019    - Negative for intraepithelial lesion or malignancy (NILM)   DIAGPAP   09/01/2017 0000    NEGATIVE FOR INTRAEPITHELIAL LESIONS OR MALIGNANCY.   DIAGPAP  08/07/2016 0000    NEGATIVE FOR INTRAEPITHELIAL LESIONS OR MALIGNANCY.   DIAGPAP SHIFT IN [place] SUGGESTIVE OF BACTERIAL VAGINOSIS. 08/07/2016 0000   HPVHIGH Negative 01/15/2021 1019   ADEQPAP  01/15/2021 1019    Satisfactory for evaluation; transformation zone component PRESENT.   ADEQPAP  09/01/2017 0000    Satisfactory for evaluation  endocervical/transformation zone component PRESENT.   ADEQPAP  08/07/2016 0000    Satisfactory for evaluation  endocervical/transformation zone component PRESENT.    High Risk HPV: Positive  Adequacy:  Satisfactory for evaluation, transformation zone component PRESENT  Diagnosis:  Atypical squamous cells of undetermined significance (ASC-US )   Review of Systems:  Pertinent items noted in HPI and remainder of comprehensive ROS otherwise negative.  Physical Exam:   General:  Alert, oriented and cooperative. Patient appears to be in no acute distress.  Mental Status: Normal mood and affect. Normal behavior. Normal judgment and thought content.   Respiratory: Normal respiratory effort, no problems with respiration noted  Rest of physical exam deferred due to type of encounter  Labs and Imaging FINAL MICROSCOPIC DIAGNOSIS:   A. ENDOMETRIUM, BIOPSY:  Inactive endometrial glands with stromal progestational changes.  Negative for endometrial intraepithelial neoplasia (EIN) and malignancy.    Assessment and Plan:     1. Abnormal uterine bleeding (AUB) (Primary) Discussed endometrial ablation is approximately 80% successful, defining success has decreased/reduction in her menstrual flow.  Noted that ablation is indicated for  menstrual flow.  Discussed postoperative expectations and recovery.  All questions answered will proceed with endometrial ablation.  Noted that if ablation is unsuccessful, can proceed with hysterectomy at a later time.       I discussed the  assessment and treatment plan with the patient. The patient was provided an opportunity to ask questions and all were answered. The patient agreed with the plan and demonstrated an understanding of the instructions.   The patient was advised to call back or seek an in-person evaluation/go to the ED if the symptoms worsen or if the condition fails to improve as anticipated.  I provided 5 minutes of face-to-face time during this encounter. I also spent 5 minutes dedicated to the care of this patient including pre-visit review of records, post visit ordering of medications and appropriate tests or procedures, coordinating care and documenting this visit encounter.    Kiki Pelton, MD Center for [employer], [facility]

## 2024-03-22 ENCOUNTER — Telehealth: Payer: Self-pay

## 2024-03-22 ENCOUNTER — Encounter: Payer: Self-pay | Admitting: Obstetrics and Gynecology

## 2024-03-22 NOTE — Telephone Encounter (Signed)
 Patient sent a Mychart message requesting her 04/05/24 surgery w/ Dr. Elester Grim be cancelled. Patient chose to move forward with original procedure and was scheduled on 05/09/24 at [facility] at 7:30 am w/ Dr. Elester Grim. Surgery details and pre-op information was provided by phone.

## 2024-03-23 ENCOUNTER — Ambulatory Visit: Payer: Self-pay | Admitting: Family Medicine

## 2024-03-23 ENCOUNTER — Encounter: Payer: Self-pay | Admitting: Family Medicine

## 2024-03-23 ENCOUNTER — Telehealth: Payer: Self-pay | Admitting: Obstetrics and Gynecology

## 2024-03-23 ENCOUNTER — Ambulatory Visit (INDEPENDENT_AMBULATORY_CARE_PROVIDER_SITE_OTHER)

## 2024-03-23 ENCOUNTER — Ambulatory Visit (INDEPENDENT_AMBULATORY_CARE_PROVIDER_SITE_OTHER): Admitting: Family Medicine

## 2024-03-23 VITALS — BP 142/88 | Temp 98.7°F | Ht 69.0 in | Wt 239.0 lb

## 2024-03-23 DIAGNOSIS — E66812 Obesity, class 2: Secondary | ICD-10-CM | POA: Diagnosis not present

## 2024-03-23 DIAGNOSIS — E559 Vitamin D deficiency, unspecified: Secondary | ICD-10-CM | POA: Diagnosis not present

## 2024-03-23 DIAGNOSIS — R718 Other abnormality of red blood cells: Secondary | ICD-10-CM | POA: Diagnosis not present

## 2024-03-23 DIAGNOSIS — Z13220 Encounter for screening for lipoid disorders: Secondary | ICD-10-CM | POA: Diagnosis not present

## 2024-03-23 DIAGNOSIS — Z6835 Body mass index (BMI) 35.0-35.9, adult: Secondary | ICD-10-CM

## 2024-03-23 DIAGNOSIS — I10 Essential (primary) hypertension: Secondary | ICD-10-CM

## 2024-03-23 DIAGNOSIS — R7303 Prediabetes: Secondary | ICD-10-CM

## 2024-03-23 LAB — CBC WITH DIFFERENTIAL/PLATELET
Basophils Absolute: 0 10*3/uL (ref 0.0–0.1)
Basophils Relative: 0.6 % (ref 0.0–3.0)
Eosinophils Absolute: 0.1 10*3/uL (ref 0.0–0.7)
Eosinophils Relative: 1.9 % (ref 0.0–5.0)
HCT: 38 % (ref 36.0–46.0)
Hemoglobin: 12.1 g/dL (ref 12.0–15.0)
Lymphocytes Relative: 25.2 % (ref 12.0–46.0)
Lymphs Abs: 1.6 10*3/uL (ref 0.7–4.0)
MCHC: 31.8 g/dL (ref 30.0–36.0)
MCV: 71.5 fl — ABNORMAL LOW (ref 78.0–100.0)
Monocytes Absolute: 0.4 10*3/uL (ref 0.1–1.0)
Monocytes Relative: 6.9 % (ref 3.0–12.0)
Neutro Abs: 4 10*3/uL (ref 1.4–7.7)
Neutrophils Relative %: 65.4 % (ref 43.0–77.0)
Platelets: 198 10*3/uL (ref 150.0–400.0)
RBC: 5.32 Mil/uL — ABNORMAL HIGH (ref 3.87–5.11)
RDW: 13.7 % (ref 11.5–15.5)
WBC: 6.2 10*3/uL (ref 4.0–10.5)

## 2024-03-23 LAB — COMPREHENSIVE METABOLIC PANEL WITH GFR
ALT: 18 U/L (ref 0–35)
AST: 31 U/L (ref 0–37)
Albumin: 3.7 g/dL (ref 3.5–5.2)
Alkaline Phosphatase: 33 U/L — ABNORMAL LOW (ref 39–117)
BUN: 8 mg/dL (ref 6–23)
CO2: 26 meq/L (ref 19–32)
Calcium: 8.5 mg/dL (ref 8.4–10.5)
Chloride: 107 meq/L (ref 96–112)
Creatinine, Ser: 0.71 mg/dL (ref 0.40–1.20)
GFR: 111.6 mL/min (ref >60.00)
Glucose, Bld: 114 mg/dL — ABNORMAL HIGH (ref 70–99)
Potassium: 4.2 meq/L (ref 3.5–5.1)
Sodium: 139 meq/L (ref 135–145)
Total Bilirubin: 0.3 mg/dL (ref 0.2–1.2)
Total Protein: 6.4 g/dL (ref 6.0–8.3)

## 2024-03-23 LAB — TSH: TSH: 2.58 u[IU]/mL (ref 0.35–5.50)

## 2024-03-23 LAB — LIPID PANEL
Cholesterol: 149 mg/dL (ref 0–200)
HDL: 45.8 mg/dL (ref >39.00)
LDL Cholesterol: 81 mg/dL (ref 0–99)
NonHDL: 103.15
Total CHOL/HDL Ratio: 3
Triglycerides: 110 mg/dL (ref 0.0–149.0)
VLDL: 22 mg/dL (ref 0.0–40.0)

## 2024-03-23 LAB — HEMOGLOBIN A1C: Hgb A1c MFr Bld: 5.9 % (ref 4.6–6.5)

## 2024-03-23 LAB — VITAMIN D 25 HYDROXY (VIT D DEFICIENCY, FRACTURES): VITD: 9.11 ng/mL — ABNORMAL LOW (ref 30.00–100.00)

## 2024-03-23 MED ORDER — VITAMIN D3 1.25 MG (50000 UT) PO CAPS: 1.2500 mg | ORAL_CAPSULE | ORAL | 0 refills | Status: DC

## 2024-03-23 MED ORDER — OLMESARTAN MEDOXOMIL 20 MG PO TABS: 20.0000 mg | ORAL_TABLET | Freq: Every day | ORAL | 0 refills | Status: DC

## 2024-03-23 NOTE — Progress Notes (Signed)
 Established Patient Office Visit   Subjective:  Patient ID: [name], female    DOB: 1991/07/15  Age: 33 y.o. MRN: [id number]  Chief Complaint  Patient presents with   Medical Management of Chronic Issues    HPI HTN: Chronic. Patient was prescribed Olmesartan  at her last appointment in 04/2023. She stopped taking medication months ago due to nausea when she took medication. Patient reports she only monitors her BP at work when she is having symptoms, around 154/76. She reports she will experience left sided chest pain, described as a cramp. Complains of dizziness, lightheadedness, HA, and blurry vision occurs about twice a week. Last had those symptoms on last Friday. Denies SHOB, lower extremity edema. She reports she took Amlodipine  and had nausea/vomiting with her previous provider. Also, been on Propranolol previously. She was initially placed on medication due to having headaches, which didn't help.  ROS See HPI above     Objective:   BP (!) 142/88   Temp 98.7 F (37.1 C) (Oral)   Ht 5\' 9"  (1.753 m)   Wt 239 lb (108.4 kg)   BMI 35.29 kg/m    Physical Exam Vitals reviewed.  Constitutional:      General: She is not in acute distress.    Appearance: Normal appearance. She is obese. She is not ill-appearing, toxic-appearing or diaphoretic.  HENT:     Head: Normocephalic and atraumatic.  Eyes:     General:        Right eye: No discharge.        Left eye: No discharge.     Conjunctiva/sclera: Conjunctivae normal.  Cardiovascular:     Rate and Rhythm: Normal rate and regular rhythm.     Heart sounds: Normal heart sounds. No murmur heard.    No friction rub. No gallop.  Pulmonary:     Effort: Pulmonary effort is normal. No respiratory distress.     Breath sounds: Normal breath sounds.  Musculoskeletal:        General: Normal range of motion.  Skin:    General: Skin is warm and dry.  Neurological:     General: No focal deficit present.     Mental Status: She  is alert and oriented to person, place, and time. Mental status is at baseline.  Psychiatric:        Mood and Affect: Mood normal.        Behavior: Behavior normal.        Thought Content: Thought content normal.        Judgment: Judgment normal.      Assessment & Plan:  Vitamin D  deficiency -     VITAMIN D  25 Hydroxy (Vit-D Deficiency, Fractures)  Hypertension, unspecified type Assessment & Plan: Elevated. Restart Olmesartan  20mg  tablet daily for hypertension. Recommend to try taking medication at night when eating dinner to see if this will help with nausea. Recommend to monitor her blood pressure twice a day, once in the AM, and one in the PM with an upper arm blood pressure cuff. Ordered CMP to assess kidney function.   Orders: -     Olmesartan  Medoxomil; Take 1 tablet (20 mg total) by mouth daily.  Dispense: 30 tablet; Refill: 0 -     Comprehensive metabolic panel with GFR  Prediabetes -     Hemoglobin A1c  Lipid screening -     Lipid panel  Class 2 severe obesity due to excess calories with serious comorbidity and body mass index (BMI) of 35.0  to 35.9 in adult (HCC) -     CBC with Differential/Platelet -     Comprehensive metabolic panel with GFR -     Hemoglobin A1c -     Lipid panel -     TSH   1.Review health maintenance:  -Covid booster: Declines  -Tdap vaccine: She thinks she had it in November 2024, will upload document.  2. Ordered labs (CBC, A1c, Lipid panel-fasting,vitamin D , and TSH) based on BMI-obesity, prediabetes, vitamin D  deficiency, and screening.  Return in about 2 weeks (around 04/06/2024) for follow-up.   Wende Longstreth, NP

## 2024-03-23 NOTE — Assessment & Plan Note (Signed)
 Elevated. Restart Olmesartan  20mg  tablet daily for hypertension. Recommend to try taking medication at night when eating dinner to see if this will help with nausea. Recommend to monitor her blood pressure twice a day, once in the AM, and one in the PM with an upper arm blood pressure cuff. Ordered CMP to assess kidney function.

## 2024-03-23 NOTE — Addendum Note (Signed)
 Addended by: Teodoro Feller B on: 03/23/2024 01:21 PM   Modules accepted: Orders

## 2024-03-23 NOTE — Telephone Encounter (Signed)
 Called the pt [name] for her to call the office about the FMLA/Disabiltiy ([employer]) pw we rec'd via fax and to see when her first day that she will be out of work and her 1st day she will be returning back to work.  And to also see how she would like to take care of the payment.   Pt called in as I was tying this message.  The two questions were answered by the pt and she also made an payment (Credit Card was used and payment of $15.00 was rec'd).  Next step is for the forms to be completed by the provider and faxed to [phone].

## 2024-03-23 NOTE — Patient Instructions (Signed)
-  It was good to see you and look forward to taking care of you.  -Ordered labs. Office will call with lab results and you will see them on MyChart. -Restart Olmesartan  20mg  tablet daily for hypertension. Recommend to try taking medication at night when eating dinner to see if this will help with nausea. Recommend to monitor your blood pressure twice a day, once in the AM, and one in the PM with an upper arm blood pressure cuff.  -Follow up in 2 weeks.

## 2024-03-24 ENCOUNTER — Other Ambulatory Visit: Payer: Self-pay | Admitting: Family Medicine

## 2024-03-24 DIAGNOSIS — R718 Other abnormality of red blood cells: Secondary | ICD-10-CM

## 2024-03-24 LAB — VITAMIN B12: Vitamin B-12: 154 pg/mL — ABNORMAL LOW (ref 211–911)

## 2024-03-24 LAB — FOLATE: Folate: 8.6 ng/mL (ref >5.9)

## 2024-03-25 ENCOUNTER — Telehealth: Payer: Self-pay

## 2024-03-25 ENCOUNTER — Ambulatory Visit: Payer: Self-pay | Admitting: Family Medicine

## 2024-03-25 DIAGNOSIS — E538 Deficiency of other specified B group vitamins: Secondary | ICD-10-CM | POA: Insufficient documentation

## 2024-03-25 MED ORDER — METFORMIN HCL ER 500 MG PO TB24: 500.0000 mg | ORAL_TABLET | Freq: Every day | ORAL | 0 refills | Status: DC

## 2024-03-25 NOTE — Addendum Note (Signed)
 Addended by: Teodoro Feller B on: 03/25/2024 08:13 AM   Modules accepted: Orders

## 2024-03-25 NOTE — Telephone Encounter (Signed)
 Copied from CRM [id number]. Topic: General - Other >> Mar 25, 2024 11:28 AM [name] wrote: Reason for CRM: patient is wanting to know if she can be seen as soon as the office opens for a b12 shot the earliest appt that was available was 9 and she said she can't do that time she would like a call back regarding this

## 2024-03-25 NOTE — Telephone Encounter (Signed)
Noted--called pt

## 2024-03-28 ENCOUNTER — Encounter (HOSPITAL_COMMUNITY): Payer: Self-pay | Admitting: Obstetrics and Gynecology

## 2024-03-28 ENCOUNTER — Encounter: Payer: Self-pay | Admitting: Obstetrics and Gynecology

## 2024-03-28 ENCOUNTER — Inpatient Hospital Stay (HOSPITAL_COMMUNITY)
Admission: AD | Admit: 2024-03-28 | Discharge: 2024-03-28 | Disposition: A | Discharge status: Home / Self Care | Attending: Obstetrics and Gynecology | Admitting: Obstetrics and Gynecology

## 2024-03-28 DIAGNOSIS — Z79899 Other long term (current) drug therapy: Secondary | ICD-10-CM | POA: Diagnosis not present

## 2024-03-28 DIAGNOSIS — N939 Abnormal uterine and vaginal bleeding, unspecified: Secondary | ICD-10-CM

## 2024-03-28 LAB — CBC
HCT: 39.7 % (ref 36.0–46.0)
Hemoglobin: 12.2 g/dL (ref 12.0–15.0)
MCH: 22.9 pg — ABNORMAL LOW (ref 26.0–34.0)
MCHC: 30.7 g/dL (ref 30.0–36.0)
MCV: 74.5 fL — ABNORMAL LOW (ref 80.0–100.0)
Platelets: 191 10*3/uL (ref 150–400)
RBC: 5.33 MIL/uL — ABNORMAL HIGH (ref 3.87–5.11)
RDW: 13.7 % (ref 11.5–15.5)
WBC: 5.8 10*3/uL (ref 4.0–10.5)
nRBC: 0 % (ref 0.0–0.2)

## 2024-03-28 MED ORDER — TRANEXAMIC ACID 650 MG PO TABS
1300.0000 mg | ORAL_TABLET | Freq: Three times a day (TID) | ORAL | Status: DC
  Administered 2024-03-28: 1300 mg via ORAL
  Filled 2024-03-28: qty 2

## 2024-03-28 NOTE — Telephone Encounter (Signed)
 Good morning! Just wanted to give an update.  Patient visited MAU this morning. Hemodynamically stable, Hgb 12.2.  Reports TXA was not effective when she took it in April. Discussed TXA can offer up to 40% reduction in bleeding, but if ineffective may need to consider alternative until definitive treatment with hysterectomy.

## 2024-03-28 NOTE — MAU Note (Signed)
 [name] is a 33 y.o. at Unknown here in MAU reporting: is having some heavy bleeding.  Ongoing problem for 2 months. Is scheduled for hysterectomy next month. Soaking tampons, changing hourly. Became heavy last night.  Is on birth control (pills and depo), was given other meds to help control bleeding.  Was started on new BP med and Metformin  4 days ago.  Unsure if related. Has never been this heavy.  Is feeling dizzy.  LMP: 6/6 Onset of complaint: ongoing Pain score: mild cramping Vitals:   03/28/24 0749  BP: 136/85  Pulse: 70  Resp: 16  Temp: 98.7 F (37.1 C)  SpO2: 100%     Lab orders placed from triage:

## 2024-03-28 NOTE — MAU Provider Note (Signed)
 Chief Complaint:  Vaginal Bleeding and Dizziness   HPI   None     [name] is a 33 y.o. G2P2002 at Unknown who presents to maternity admissions reporting heavy vaginal bleeding.  She has been experiencing heavy vaginal bleeding for about 2 months and is scheduled for hysterectomy 05/09/2024. She is currently taking OCPs and Depo.  She was given TXA in April to try for 5 days for heavy bleeding until switching back to OCP/Depo combination. She did not notice that the TXA helped much, she still had a heavy period. Since then, she endorses her period in May was also heavy for 7 days, briefly stopped for about 7 days, and then she had midcycle bleeding. LMP started 03/25/2024.  Her vaginal bleeding got heavy last night, endorses changing tampons hourly.  She also notes that she was started on a new blood pressure medication (olmesartan ) and metformin  4 days ago and is unsure if this is related.  She is feeling dizzy and endorses mild cramping as well.  Additional history obtained from mother.  Pregnancy Course: Receives care at [facility]. Previous records reviewed.   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HCC)    Hypertension    Migraine    Right side   Prediabetes    Vaginal Pap smear, abnormal    Vitamin B12 deficiency    Vitamin D  deficiency    OB History  Gravida Para Term Preterm AB Living  2 2 2  0 0 2  SAB IAB Ectopic Multiple Live Births  0 0 0 0 2    # Outcome Date GA Lbr Len/2nd Weight Sex Type Anes PTL Lv  2 Term 07/31/11 [redacted]w[redacted]d 04:00 / 00:12 3405 g F Vag-Spont EPI  LIV     Birth Comments: none  1 Term 01/29/09     Vag-Spont   LIV   Past Surgical History:  Procedure Laterality Date   MASS EXCISION N/A 09/13/2013   Procedure: EXCISION OF SOFT TISSUE MASS ON BACK;  Surgeon: Alger Infield, MD;  Location: [facility];  Service: Plastics;  Laterality: N/A;   none     WISDOM TOOTH EXTRACTION     Family History  Problem  Relation Age of Onset   Hypertension Mother    Diabetes Father    ADD / ADHD Brother    ADD / ADHD Daughter    Dementia Maternal Grandmother    Heart failure Maternal Grandfather    Social History   Tobacco Use   Smoking status: Never   Smokeless tobacco: Never  Vaping Use   Vaping status: Never Used  Substance Use Topics   Alcohol use: Yes    Comment: occasionally   Drug use: No   Allergies  Allergen Reactions   Amlodipine  Nausea And Vomiting   Medications Prior to Admission  Medication Sig Dispense Refill Last Dose/Taking   metFORMIN  (GLUCOPHAGE -XR) 500 MG 24 hr tablet Take 1 tablet (500 mg total) by mouth daily with breakfast. 90 tablet 0 03/27/2024   norgestimate -ethinyl estradiol  (ORTHO-CYCLEN) 0.25-35 MG-MCG tablet Take 1 tablet by mouth daily. 28 tablet 3 03/27/2024   olmesartan  (BENICAR ) 20 MG tablet Take 1 tablet (20 mg total) by mouth daily. 30 tablet 0 03/27/2024   Cholecalciferol (VITAMIN D3) 1.25 MG (50000 UT) CAPS Take 1 capsule (1.25 mg total) by mouth every 7 (seven) days. 12 capsule 0 03/24/2024   medroxyPROGESTERone  (DEPO-PROVERA ) 150 MG/ML injection Inject 1 mL (150 mg total) into the muscle every  3 (three) months. 1 mL 3     I have reviewed patient's Past Medical Hx, Surgical Hx, Family Hx, Social Hx, medications and allergies.   ROS  Pertinent items noted in HPI and remainder of comprehensive ROS otherwise negative.   PHYSICAL EXAM  Patient Vitals for the past 24 hrs:  BP Temp Pulse Resp SpO2 Height Weight  03/28/24 0749 136/85 98.7 F (37.1 C) 70 16 100 % 5\' 9"  (1.753 m) 107.6 kg    Constitutional: Well-developed, well-nourished female in no acute distress.  HEENT: atraumatic, normocephalic. Neck has normal ROM. EOM intact. Cardiovascular: normal rate & rhythm, warm and well-perfused Respiratory: normal effort, no problems with respiration noted. CTA bilaterally. GI: Abd soft, non-tender, non-distended MSK: Extremities nontender, no edema, normal  ROM Skin: warm and dry. Acyanotic, no jaundice or pallor. Neurologic: Alert and oriented x 4. No abnormal coordination. Psychiatric: Normal mood. Speech not slurred, not rapid/pressured. Patient is cooperative. GU: no CVA tenderness  Labs: Results for orders placed or performed during the hospital encounter of 03/28/24 (from the past 24 hours)  CBC     Status: Abnormal   Collection Time: 03/28/24  7:53 AM  Result Value Ref Range   WBC 5.8 4.0 - 10.5 K/uL   RBC 5.33 (H) 3.87 - 5.11 MIL/uL   Hemoglobin 12.2 12.0 - 15.0 g/dL   HCT 16.1 09.6 - 04.5 %   MCV 74.5 (L) 80.0 - 100.0 fL   MCH 22.9 (L) 26.0 - 34.0 pg   MCHC 30.7 30.0 - 36.0 g/dL   RDW 40.9 81.1 - 91.4 %   Platelets 191 150 - 400 K/uL   nRBC 0.0 0.0 - 0.2 %    Imaging:  No results found.  MDM & MAU COURSE  MDM: Moderate  MAU Course: -Vital signs within normal limits. Hemodynamically stable. -CBC for hemoglobin. Orthostatic vital signs given dizziness. -TXA for heavy vaginal bleeding. -Hemoglobin 12.2, within normal limits. -Discussed expectations for TXA therapy at home.   Orders Placed This Encounter  Procedures   CBC   Orthostatic vital signs   Discharge patient   Meds ordered this encounter  Medications   tranexamic acid  (LYSTEDA ) tablet 1,300 mg    ASSESSMENT   1. Abnormal uterine bleeding (AUB)     PLAN  Discharge home in stable condition with return precautions.  Encouraged to follow up with Dr. Elester Grim about possible alternatives until definitive treatment with hysterectomy. Patient verbalized understanding and is agreeable to this plan. Sent message to Dr. Elester Grim with updates from this MAU visit as well.    Follow-up Information     Kiki Pelton, MD Follow up.   Specialty: Obstetrics and Gynecology Contact information: [address] [place] [address] [phone]                  Allergies as of 03/28/2024       Reactions   Amlodipine  Nausea And Vomiting         Medication List     TAKE these medications    medroxyPROGESTERone  150 MG/ML injection Commonly known as: DEPO-PROVERA  Inject 1 mL (150 mg total) into the muscle every 3 (three) months.   metFORMIN  500 MG 24 hr tablet Commonly known as: GLUCOPHAGE -XR Take 1 tablet (500 mg total) by mouth daily with breakfast.   norgestimate -ethinyl estradiol  0.25-35 MG-MCG tablet Commonly known as: ORTHO-CYCLEN Take 1 tablet by mouth daily.   olmesartan  20 MG tablet Commonly known as: BENICAR  Take 1 tablet (20 mg total) by mouth  daily.   Vitamin D3 1.25 MG (50000 UT) Caps Take 1 capsule (1.25 mg total) by mouth every 7 (seven) days.        Noreene Bearded, PA

## 2024-03-28 NOTE — MAU Note (Signed)
 Upon taking orthostatic vitals, patient was unable to tolerate standing for 3 mins for 2nd standing orthostatic measurement

## 2024-03-28 NOTE — Discharge Instructions (Addendum)
 It was a pleasure taking care of you in the MAU today. Your vital signs are normal and stable, and your hemoglobin blood test is normal. This is all very reassuring, so you will be able to go home today.  Since you are stable, the next step will be to talk with Dr. Elester Grim about possible alternatives to the tranexamic acid  (TXA) and/or moving surgery earlier if there are openings.  Dr. Elester Grim is in the office today, so she and her team should be checking MyChart messages throughout the day. You can talk with her about trying a different medication other than the tranexamic acid  (TXA) that you have tried if it did not slow the bleeding. You can also ask about the possibility of moving surgery earlier.  Tranexamic Acid  (TXA) TXA is a medication that is used to slow heavy vaginal bleeding. It helps your body's blood clotting system and can work very quickly (within 2-3 hours) to slow vaginal bleeding. Most people will experience about a 40% reduction in bleeding while taking this medication. It is recommended to take 1300 mg (2 tablets) three times daily for a total of 5 days. Stop taking birth control pills while taking this medication.

## 2024-03-29 ENCOUNTER — Encounter

## 2024-03-29 ENCOUNTER — Ambulatory Visit (INDEPENDENT_AMBULATORY_CARE_PROVIDER_SITE_OTHER)

## 2024-03-29 ENCOUNTER — Encounter: Payer: Self-pay | Admitting: Obstetrics and Gynecology

## 2024-03-29 DIAGNOSIS — N939 Abnormal uterine and vaginal bleeding, unspecified: Secondary | ICD-10-CM

## 2024-03-29 DIAGNOSIS — E538 Deficiency of other specified B group vitamins: Secondary | ICD-10-CM | POA: Diagnosis not present

## 2024-03-29 MED ORDER — NORGESTIMATE-ETH ESTRADIOL 0.25-35 MG-MCG PO TABS: ORAL_TABLET | ORAL | 0 refills | Status: DC

## 2024-03-29 MED ORDER — CYANOCOBALAMIN 1000 MCG/ML IJ SOLN
1000.0000 ug | Freq: Once | INTRAMUSCULAR | Status: AC
  Administered 2024-03-29: 1000 ug via INTRAMUSCULAR

## 2024-03-29 NOTE — Progress Notes (Signed)
 Patient is in office today for a nurse visit for B12 Injection. Patient Injection was given in the  Right deltoid. Patient tolerated injection well.

## 2024-03-29 NOTE — Addendum Note (Signed)
 Addended by: Mykira Hofmeister O on: 03/29/2024 03:56 PM   Modules accepted: Orders

## 2024-04-05 ENCOUNTER — Encounter: Payer: Self-pay | Admitting: Obstetrics and Gynecology

## 2024-04-05 ENCOUNTER — Ambulatory Visit (HOSPITAL_COMMUNITY): Admit: 2024-04-05 | Admitting: Obstetrics and Gynecology

## 2024-04-05 ENCOUNTER — Ambulatory Visit (INDEPENDENT_AMBULATORY_CARE_PROVIDER_SITE_OTHER)

## 2024-04-05 DIAGNOSIS — E538 Deficiency of other specified B group vitamins: Secondary | ICD-10-CM | POA: Diagnosis not present

## 2024-04-05 SURGERY — HYDROTHERMAL ABLATION/NOVASURE
Anesthesia: Choice

## 2024-04-05 MED ORDER — CYANOCOBALAMIN 1000 MCG/ML IJ SOLN
1000.0000 ug | Freq: Once | INTRAMUSCULAR | Status: AC
  Administered 2024-04-05: 1000 ug via INTRAMUSCULAR

## 2024-04-05 NOTE — Progress Notes (Signed)
 Patient is in office today for a nurse visit for B12 Injection. Patient Injection was given in the  Right deltoid. Patient tolerated injection well.

## 2024-04-07 NOTE — Telephone Encounter (Signed)
 FMLA Forms were received for pts mother to have filled out by the provider.  Pt was called 6/19 1:17 p.m. and she was made aware that we had received her mothers forms and to see how she would like to receive them after they have been completed.  Pt stated that she would like to have them in her mychart and also a copy for her to pick up.  Pt paid the $15.00 form fee by credit card via phone and a receipt was emailed to the email provided.  Forms were placed in the nurse box for completion.

## 2024-04-12 ENCOUNTER — Ambulatory Visit (INDEPENDENT_AMBULATORY_CARE_PROVIDER_SITE_OTHER)

## 2024-04-12 DIAGNOSIS — E538 Deficiency of other specified B group vitamins: Secondary | ICD-10-CM

## 2024-04-12 MED ORDER — CYANOCOBALAMIN 1000 MCG/ML IJ SOLN
1000.0000 ug | Freq: Once | INTRAMUSCULAR | Status: AC
  Administered 2024-04-12: 1000 ug via INTRAMUSCULAR

## 2024-04-12 NOTE — Progress Notes (Signed)
 Patient is in office today for a nurse visit for B12 Injection. Patient Injection was given in the  Right deltoid. Patient tolerated injection well.

## 2024-04-14 ENCOUNTER — Other Ambulatory Visit: Payer: Self-pay | Admitting: Family Medicine

## 2024-04-14 DIAGNOSIS — I10 Essential (primary) hypertension: Secondary | ICD-10-CM

## 2024-04-15 ENCOUNTER — Ambulatory Visit (INDEPENDENT_AMBULATORY_CARE_PROVIDER_SITE_OTHER): Admitting: Family Medicine

## 2024-04-15 ENCOUNTER — Encounter: Payer: Self-pay | Admitting: Family Medicine

## 2024-04-15 DIAGNOSIS — I10 Essential (primary) hypertension: Secondary | ICD-10-CM

## 2024-04-15 MED ORDER — OLMESARTAN MEDOXOMIL 20 MG PO TABS: 20.0000 mg | ORAL_TABLET | Freq: Every day | ORAL | 1 refills | Status: DC

## 2024-04-15 NOTE — Patient Instructions (Signed)
-  Blood pressure has improved and controlled. Continue Olmesartan  20mg  daily. Refilled medication.  -Follow up in 10 weeks for a chronic management visit. Please be fasting.  -Take care!

## 2024-04-15 NOTE — Progress Notes (Signed)
   Established Patient Office Visit   Subjective:  Patient ID: [name], female    DOB: July 14, 1991  Age: 33 y.o. MRN: [id number]  Chief Complaint  Patient presents with   Medical Management of Chronic Issues    2 week follow up  hypertension     HPI HTN: On previous appointment, restarted Olmesartan  20mg  daily. Previously, took Olmesartan  where it made her nausea. Today, she reports she is doing well on the medication. No nausea. She is taking the medication at night time. She forgot her readings, but thinks her readings have been around 120s-130s/mostly in 70's. Once she reports having a systolic of 143. Other than that, she has been doing well. Denies CP, SHOB, HA, dizziness, lightheadedness, or lower extremity edema.  ROS See HPI above     Objective:   BP 122/74   Temp 98.3 F (36.8 C) (Oral)   Ht 5' 9 (1.753 m)   Wt 235 lb (106.6 kg)   LMP 03/25/2024   BMI 34.70 kg/m  BP Readings from Last 3 Encounters:  04/15/24 122/74  03/28/24 125/73  03/23/24 (!) 142/88      Physical Exam Vitals reviewed.  Constitutional:      General: She is not in acute distress.    Appearance: Normal appearance. She is obese. She is not ill-appearing, toxic-appearing or diaphoretic.   Eyes:     General:        Right eye: No discharge.        Left eye: No discharge.     Conjunctiva/sclera: Conjunctivae normal.    Cardiovascular:     Rate and Rhythm: Normal rate.  Pulmonary:     Effort: Pulmonary effort is normal. No respiratory distress.   Musculoskeletal:        General: Normal range of motion.   Skin:    General: Skin is warm and dry.   Neurological:     General: No focal deficit present.     Mental Status: She is alert and oriented to person, place, and time. Mental status is at baseline.   Psychiatric:        Mood and Affect: Mood normal.        Behavior: Behavior normal.        Thought Content: Thought content normal.        Judgment: Judgment normal.       Assessment & Plan:  Hypertension, unspecified type Assessment & Plan: Blood pressure has improved and controlled. Continue Olmesartan  20mg  daily. Refilled medication.   Orders: -     Olmesartan  Medoxomil; Take 1 tablet (20 mg total) by mouth daily.  Dispense: 90 tablet; Refill: 1  At next visit, advised to be fasting. Will need to recheck Vitamin D /B12, A1c, CMP, and CBC.   Return in about 10 weeks (around 06/24/2024) for chronic management.   Raheen Capili, NP

## 2024-04-15 NOTE — Assessment & Plan Note (Signed)
 Blood pressure has improved and controlled. Continue Olmesartan  20mg  daily. Refilled medication.

## 2024-04-19 ENCOUNTER — Ambulatory Visit (INDEPENDENT_AMBULATORY_CARE_PROVIDER_SITE_OTHER): Admitting: Family Medicine

## 2024-04-19 ENCOUNTER — Other Ambulatory Visit: Payer: Self-pay

## 2024-04-19 ENCOUNTER — Encounter: Payer: Self-pay | Admitting: Obstetrics and Gynecology

## 2024-04-19 ENCOUNTER — Emergency Department (HOSPITAL_COMMUNITY)
Admission: EM | Admit: 2024-04-19 | Discharge: 2024-04-19 | Disposition: A | Discharge status: Home / Self Care | Attending: Emergency Medicine | Admitting: Emergency Medicine

## 2024-04-19 ENCOUNTER — Encounter: Payer: Self-pay | Admitting: Family Medicine

## 2024-04-19 ENCOUNTER — Emergency Department (HOSPITAL_COMMUNITY)
Admission: RE | Admit: 2024-04-19 | Discharge: 2024-04-19 | Disposition: A | Discharge status: Home / Self Care | Source: Ambulatory Visit | Attending: Emergency Medicine

## 2024-04-19 ENCOUNTER — Ambulatory Visit

## 2024-04-19 VITALS — BP 130/84 | Temp 98.3°F | Ht 69.0 in | Wt 239.0 lb

## 2024-04-19 DIAGNOSIS — E538 Deficiency of other specified B group vitamins: Secondary | ICD-10-CM

## 2024-04-19 DIAGNOSIS — R600 Localized edema: Secondary | ICD-10-CM

## 2024-04-19 DIAGNOSIS — I82442 Acute embolism and thrombosis of left tibial vein: Secondary | ICD-10-CM | POA: Diagnosis not present

## 2024-04-19 DIAGNOSIS — M79662 Pain in left lower leg: Secondary | ICD-10-CM | POA: Diagnosis not present

## 2024-04-19 DIAGNOSIS — M7989 Other specified soft tissue disorders: Secondary | ICD-10-CM | POA: Diagnosis not present

## 2024-04-19 DIAGNOSIS — Z7901 Long term (current) use of anticoagulants: Secondary | ICD-10-CM | POA: Diagnosis not present

## 2024-04-19 DIAGNOSIS — Z86718 Personal history of other venous thrombosis and embolism: Secondary | ICD-10-CM

## 2024-04-19 DIAGNOSIS — I82402 Acute embolism and thrombosis of unspecified deep veins of left lower extremity: Secondary | ICD-10-CM | POA: Diagnosis not present

## 2024-04-19 HISTORY — DX: Personal history of other venous thrombosis and embolism: Z86.718

## 2024-04-19 LAB — CBC WITH DIFFERENTIAL/PLATELET
Abs Immature Granulocytes: 0.03 10*3/uL (ref 0.00–0.07)
Basophils Absolute: 0.1 10*3/uL (ref 0.0–0.1)
Basophils Relative: 1 %
Eosinophils Absolute: 0.2 10*3/uL (ref 0.0–0.5)
Eosinophils Relative: 2 %
HCT: 39.1 % (ref 36.0–46.0)
Hemoglobin: 12.1 g/dL (ref 12.0–15.0)
Immature Granulocytes: 0 %
Lymphocytes Relative: 26 %
Lymphs Abs: 2.5 10*3/uL (ref 0.7–4.0)
MCH: 22.9 pg — ABNORMAL LOW (ref 26.0–34.0)
MCHC: 30.9 g/dL (ref 30.0–36.0)
MCV: 74.1 fL — ABNORMAL LOW (ref 80.0–100.0)
Monocytes Absolute: 0.6 10*3/uL (ref 0.1–1.0)
Monocytes Relative: 6 %
Neutro Abs: 6.5 10*3/uL (ref 1.7–7.7)
Neutrophils Relative %: 65 %
Platelets: 219 10*3/uL (ref 150–400)
RBC: 5.28 MIL/uL — ABNORMAL HIGH (ref 3.87–5.11)
RDW: 13.8 % (ref 11.5–15.5)
WBC: 9.9 10*3/uL (ref 4.0–10.5)
nRBC: 0 % (ref 0.0–0.2)

## 2024-04-19 LAB — HCG, SERUM, QUALITATIVE: Preg, Serum: NEGATIVE

## 2024-04-19 LAB — BASIC METABOLIC PANEL WITH GFR
Anion gap: 8 (ref 5–15)
BUN: 9 mg/dL (ref 6–20)
CO2: 23 mmol/L (ref 22–32)
Calcium: 8.6 mg/dL — ABNORMAL LOW (ref 8.9–10.3)
Chloride: 105 mmol/L (ref 98–111)
Creatinine, Ser: 0.64 mg/dL (ref 0.44–1.00)
GFR, Estimated: >60 mL/min (ref >60)
Glucose, Bld: 88 mg/dL (ref 70–99)
Potassium: 3.7 mmol/L (ref 3.5–5.1)
Sodium: 136 mmol/L (ref 135–145)

## 2024-04-19 MED ORDER — ACETAMINOPHEN 500 MG PO TABS
1000.0000 mg | ORAL_TABLET | Freq: Once | ORAL | Status: AC
  Administered 2024-04-19: 1000 mg via ORAL
  Filled 2024-04-19: qty 2

## 2024-04-19 MED ORDER — RIVAROXABAN 15 MG PO TABS
15.0000 mg | ORAL_TABLET | Freq: Once | ORAL | Status: AC
  Administered 2024-04-19: 15 mg via ORAL
  Filled 2024-04-19: qty 1

## 2024-04-19 MED ORDER — RIVAROXABAN (XARELTO) VTE STARTER PACK (15 & 20 MG)
ORAL_TABLET | ORAL | 0 refills | Status: DC
Start: 2024-04-19 — End: 2024-07-06

## 2024-04-19 MED ORDER — CYANOCOBALAMIN 1000 MCG/ML IJ SOLN
1000.0000 ug | Freq: Once | INTRAMUSCULAR | Status: AC
  Administered 2024-04-19: 1000 ug via INTRAMUSCULAR

## 2024-04-19 NOTE — Discharge Instructions (Addendum)
 Was a pleasure taking care of you here today.  As we discussed you have a blood clot in your left calf.  We have started you on a blood thinner, Xarelto.  You need to stop taking the oral birth control as this puts you at higher risk for blood clots.  If you develop any chest pain, shortness of breath, fall and have an injury such as hitting your head you need to be seen in the emergency department as this medication can cause increased bleeding.  Make sure to call your OB/GYN and your PCP tomorrow to make them aware.  Return for new or worsening symptoms  Information on my medicine - XARELTO (rivaroxaban)  WHY WAS XARELTO PRESCRIBED FOR YOU? Xarelto was prescribed to treat blood clots that may have been found in the veins of your legs (deep vein thrombosis) or in your lungs (pulmonary embolism) and to reduce the risk of them occurring again.  What do you need to know about Xarelto? The starting dose is one 15 mg tablet taken TWICE daily with food for the FIRST 21 DAYS then on (enter date)  05/11/24  the dose is changed to one 20 mg tablet taken ONCE A DAY with your evening meal.  DO NOT stop taking Xarelto without talking to the health care provider who prescribed the medication.  Refill your prescription for 20 mg tablets before you run out.  After discharge, you should have regular check-up appointments with your healthcare provider that is prescribing your Xarelto.  In the future your dose may need to be changed if your kidney function changes by a significant amount.  What do you do if you miss a dose? If you are taking Xarelto TWICE DAILY and you miss a dose, take it as soon as you remember. You may take two 15 mg tablets (total 30 mg) at the same time then resume your regularly scheduled 15 mg twice daily the next day.  If you are taking Xarelto ONCE DAILY and you miss a dose, take it as soon as you remember on the same day then continue your regularly scheduled once daily  regimen the next day. Do not take two doses of Xarelto at the same time.   Important Safety Information Xarelto is a blood thinner medicine that can cause bleeding. You should call your healthcare provider right away if you experience any of the following: Bleeding from an injury or your nose that does not stop. Unusual colored urine (red or dark brown) or unusual colored stools (red or black). Unusual bruising for unknown reasons. A serious fall or if you hit your head (even if there is no bleeding).  Some medicines may interact with Xarelto and might increase your risk of bleeding while on Xarelto. To help avoid this, consult your healthcare provider or pharmacist prior to using any new prescription or non-prescription medications, including herbals, vitamins, non-steroidal anti-inflammatory drugs (NSAIDs) and supplements.  This website has more information on Xarelto: VisitDestination.com.br.

## 2024-04-19 NOTE — Progress Notes (Signed)
 Established Patient Office Visit   Subjective:  Patient ID: [name], female    DOB: 10/17/1991  Age: 33 y.o. MRN: [id number]  Chief Complaint  Patient presents with   Leg Pain    Calf pain moving up the leg pain for x4 days    Leg Pain    Patient is complaining of left calf pain that started 4 days and swelling 3 days ago. She reports the pain is constant and becoming worse. She describes the pain as a cramping, sharp pain. Some tingling. Initially pain radiated to her right foot and now pain radiates to her buttock. No redness to calf. Does not identify anything that makes the pain better or worse. She has not done anything or took anything for the pain. Denies chest pain and SHOB.   Concerned about possible blood clot with being on Ortho-Cyclen. She does not smoke cigarettes.                                                                                                     ROS See HPI above     Objective:   BP 130/84   Temp 98.3 F (36.8 C) (Oral)   Ht 5' 9 (1.753 m)   Wt 239 lb (108.4 kg)   LMP 03/25/2024   BMI 35.29 kg/m    Physical Exam Vitals reviewed.  Constitutional:      General: She is not in acute distress.    Appearance: Normal appearance. She is obese. She is not ill-appearing, toxic-appearing or diaphoretic.  HENT:     Head: Normocephalic and atraumatic.   Eyes:     General:        Right eye: No discharge.        Left eye: No discharge.     Conjunctiva/sclera: Conjunctivae normal.    Cardiovascular:     Rate and Rhythm: Normal rate.     Pulses:          Dorsalis pedis pulses are 2+ on the left side.  Pulmonary:     Effort: Pulmonary effort is normal. No respiratory distress.     Breath sounds: Normal breath sounds.   Musculoskeletal:        General: Normal range of motion.     Left lower leg: Swelling (Mild swelling compared to right calf.) and tenderness (Calf) present. No edema.   Skin:    General: Skin is warm and dry.    Neurological:     General: No focal deficit present.     Mental Status: She is alert and oriented to person, place, and time. Mental status is at baseline.   Psychiatric:        Mood and Affect: Mood normal.        Behavior: Behavior normal.        Thought Content: Thought content normal.        Judgment: Judgment normal.      Assessment & Plan:  Pain of left calf -     US  Venous Img Lower Unilateral Left (DVT); Future  Lower extremity edema -  US  Venous Img Lower Unilateral Left (DVT); Future  -Ordered a STAT ultrasound of left lower extremity to rule out DVT. Concerned for DVT with patient complaining of left calf pain, mild edema present, and taking oral birth control.  -Advised to please call back to the office or send a MyChart message if she does not receive a phone call about an appointment before noon today.  -Once results are available. Will provide results and will be available on MyChart.  -If you develop chest pain or shortness of breath, please go directly to the closes emergency department or call 911.  Lauraine Crespo, NP

## 2024-04-19 NOTE — Patient Instructions (Addendum)
-  Ordered a STAT ultrasound of left lower extremity to rule out DVT.  -Please call back to the office or send a MyChart message if you do not receive a phone call about an appointment before noon today.  -Once results are available. Will provide results and will be available on MyChart.  -If you develop chest pain or shortness of breath, please go directly to the closes emergency department or call 911.

## 2024-04-19 NOTE — Addendum Note (Signed)
 Addended by: Allysha Tryon R on: 04/19/2024 03:18 PM   Modules accepted: Orders

## 2024-04-19 NOTE — ED Notes (Signed)
 Contacted lab in delay for hcg

## 2024-04-19 NOTE — ED Provider Notes (Signed)
 [place] EMERGENCY DEPARTMENT AT [facility] Provider Note   CSN: [id number] Arrival date & time: 04/19/24  1636     Patient presents with: Leg Pain   [name] is a 33 y.o. female here for left calf pain and swelling.  She is on Depo-Provera  as well as oral OCPs for recurrent vaginal bleeding.  She is post to have a hysterectomy at the end of this month.  Not currently bleeding.  She has had pain to her leg for 4 days now.  Discussed with PCP and was unable to arrange outpatient ultrasound subsequently sent here.  She is ambulatory here.  No chest pain or shortness of breath.  No recent falls or injuries.  No recent surgery, immobilization or malignancy.  No history of PE or DVT.   HPI     Prior to Admission medications   Medication Sig Start Date End Date Taking? Authorizing Provider  RIVAROXABAN TAD) VTE STARTER PACK (15 & 20 MG) Follow package directions: Take one 15mg  tablet by mouth twice a day. On day 22, switch to one 20mg  tablet once a day. Take with food. 04/19/24  Yes Marcile Fuquay A, PA-C  Cholecalciferol (VITAMIN D3) 1.25 MG (50000 UT) CAPS Take 1 capsule (1.25 mg total) by mouth every 7 (seven) days. 03/23/24   Williamson, Joanna R, NP  metFORMIN  (GLUCOPHAGE -XR) 500 MG 24 hr tablet Take 1 tablet (500 mg total) by mouth daily with breakfast. 03/25/24   Williamson, Joanna R, NP  norgestimate -ethinyl estradiol  (ORTHO-CYCLEN) 0.25-35 MG-MCG tablet Take 1 tablet by mouth daily. 02/02/24   Ajewole, Christana, MD  norgestimate -ethinyl estradiol  (ORTHO-CYCLEN) 0.25-35 MG-MCG tablet Take 5 tablets by mouth daily for 1 day, THEN 4 tablets daily for 1 day, THEN 3 tablets daily for 1 day, THEN 2 tablets daily for 1 day. 03/29/24 04/19/24  Ajewole, Christana, MD  olmesartan  (BENICAR ) 20 MG tablet Take 1 tablet (20 mg total) by mouth daily. 04/15/24   Billy Philippe SAUNDERS, NP    Allergies: Amlodipine     Review of Systems  Constitutional: Negative.   HENT: Negative.     Respiratory: Negative.    Cardiovascular: Negative.   Gastrointestinal: Negative.   Genitourinary: Negative.   Musculoskeletal:        Left calf pain  Skin: Negative.   Neurological: Negative.   All other systems reviewed and are negative.   Updated Vital Signs BP 129/84 (BP Location: Left Arm)   Pulse 80   Temp 98 F (36.7 C) (Oral)   Resp 18   Ht 5' 9 (1.753 m)   Wt 108 kg   LMP 03/25/2024   SpO2 100%   BMI 35.16 kg/m   Physical Exam Vitals and nursing note reviewed.  Constitutional:      General: She is not in acute distress.    Appearance: She is well-developed. She is not ill-appearing, toxic-appearing or diaphoretic.  HENT:     Head: Atraumatic.  Eyes:     Pupils: Pupils are equal, round, and reactive to light.  Cardiovascular:     Rate and Rhythm: Normal rate.     Pulses:          Posterior tibial pulses are 2+ on the left side.  Pulmonary:     Effort: No respiratory distress.  Abdominal:     General: There is no distension.  Musculoskeletal:        General: Normal range of motion.     Cervical back: Normal range of motion.  Comments: Tenderness posterior left calf.  Compartments soft.  Full range of motion.  Slight edema knee distally compared to right.  Skin:    General: Skin is warm and dry.  Neurological:     General: No focal deficit present.     Mental Status: She is alert.  Psychiatric:        Mood and Affect: Mood normal.     (all labs ordered are listed, but only abnormal results are displayed) Labs Reviewed  CBC WITH DIFFERENTIAL/PLATELET - Abnormal; Notable for the following components:      Result Value   RBC 5.28 (*)    MCV 74.1 (*)    MCH 22.9 (*)    All other components within normal limits  BASIC METABOLIC PANEL WITH GFR - Abnormal; Notable for the following components:   Calcium 8.6 (*)    All other components within normal limits  HCG, SERUM, QUALITATIVE    EKG: None  Radiology: VAS US  LOWER EXTREMITY VENOUS (DVT)  (ONLY [facility] & [facility]) Result Date: 04/19/2024  Lower Venous DVT Study Patient Name:  [name]  Date of Exam:   04/19/2024 Medical Rec #: [id number]          Accession #:    [id number] Date of Birth: 11-27-1990          Patient Gender: F Patient Age:   61 years Exam Location:  [facility] Procedure:      VAS US  LOWER EXTREMITY VENOUS (DVT) Referring Phys: Cambell Stanek --------------------------------------------------------------------------------  Indications: Pain.  Risk Factors: None identified. Comparison Study: No prior studies. Performing Technologist: Cordella Collet RVT  Examination Guidelines: A complete evaluation includes B-mode imaging, spectral Doppler, color Doppler, and power Doppler as needed of all accessible portions of each vessel. Bilateral testing is considered an integral part of a complete examination. Limited examinations for reoccurring indications may be performed as noted. The reflux portion of the exam is performed with the patient in reverse Trendelenburg.  +-----+---------------+---------+-----------+----------+--------------+ RIGHTCompressibilityPhasicitySpontaneityPropertiesThrombus Aging +-----+---------------+---------+-----------+----------+--------------+ CFV  Full           Yes      Yes                                 +-----+---------------+---------+-----------+----------+--------------+   +---------+---------------+---------+-----------+----------+--------------+ LEFT     CompressibilityPhasicitySpontaneityPropertiesThrombus Aging +---------+---------------+---------+-----------+----------+--------------+ CFV      Full           Yes      Yes                                 +---------+---------------+---------+-----------+----------+--------------+ SFJ      Full                                                        +---------+---------------+---------+-----------+----------+--------------+ FV Prox  Full                                                         +---------+---------------+---------+-----------+----------+--------------+ FV Mid   Full                                                        +---------+---------------+---------+-----------+----------+--------------+  FV DistalFull                                                        +---------+---------------+---------+-----------+----------+--------------+ PFV      Full                                                        +---------+---------------+---------+-----------+----------+--------------+ POP      Full           Yes      Yes                                 +---------+---------------+---------+-----------+----------+--------------+ PTV      None                                         Acute          +---------+---------------+---------+-----------+----------+--------------+ PERO     Full                                                        +---------+---------------+---------+-----------+----------+--------------+     Summary: RIGHT: - No evidence of common femoral vein obstruction.   LEFT: - Findings consistent with acute deep vein thrombosis involving the left posterior tibial veins.  - No cystic structure found in the popliteal fossa.  *See table(s) above for measurements and observations. Electronically signed by Debby Robertson on 04/19/2024 at 9:14:56 PM.    Final      Procedures   Medications Ordered in the ED  acetaminophen  (TYLENOL ) tablet 1,000 mg (1,000 mg Oral Given 04/19/24 1935)  Rivaroxaban (XARELTO) tablet 15 mg (15 mg Oral Given 04/19/24 9113)    33 year old here for evaluation of left lower extremity pain and swelling.  She is neurovascular intact.  No recent injury or trauma.  No chest pain or shortness of breath.  No history of PE or DVT however she is on Depo-Provera  shots as well as oral OCPs for recurrent vaginal bleeding.  Supposed to have hysterectomy at the end of this month.  She has a nonfocal  neuroexam.  She is neurovascularly intact.  Does have some slight edema from left leg compared to right distal to the knee however no obvious joint effusion or erythema to suggest septic joint.  Will plan on ultrasound, labs I suspect she likely has DVT  Labs imaging personally viewed and interpreted:  Ultrasound consistent for left posterior tibial veins. CBC no leukocytosis, hemoglobin 12.1 Metabolic panel without significant abnormality Pregnancy test neg  Discussed results with patient.  Will start her on DOAC and have her stop her OCPs. Will will need to call her OB/GYN to discuss her DVT findings and going off of the birth control.  Discussed backup protection for birth control measures.  She has no current vaginal bleeding.  We discussed risk of blood  thinners as well including injury where she need to be reassessed.   Will have her follow-up outpatient, return for any worsening symptoms.  The patient has been appropriately medically screened and/or stabilized in the ED. I have low suspicion for any other emergent medical condition which would require further screening, evaluation or treatment in the ED or require inpatient management.  Patient is hemodynamically stable and in no acute distress.  Patient able to ambulate in department prior to ED.  Evaluation does not show acute pathology that would require ongoing or additional emergent interventions while in the emergency department or further inpatient treatment.  I have discussed the diagnosis with the patient and answered all questions.  Pain is been managed while in the emergency department and patient has no further complaints prior to discharge.  Patient is comfortable with plan discussed in room and is stable for discharge at this time.  I have discussed strict return precautions for returning to the emergency department.  Patient was encouraged to follow-up with PCP/specialist refer to at discharge.                                       Medical Decision Making Amount and/or Complexity of Data Reviewed External Data Reviewed: labs, radiology and notes. Labs: ordered. Decision-making details documented in ED Course. Radiology: ordered and independent interpretation performed. Decision-making details documented in ED Course.  Risk OTC drugs. Prescription drug management. Decision regarding hospitalization. Diagnosis or treatment significantly limited by social determinants of health.       Final diagnoses:  Deep vein thrombosis (DVT) of left posterior tibial vein [facility])    ED Discharge Orders          Ordered    RIVAROXABAN (XARELTO) VTE STARTER PACK (15 & 20 MG)        04/19/24 1943               Saliha Salts A, PA-C 04/19/24 2321    Laurice Maude BROCKS, MD 04/21/24 0030

## 2024-04-19 NOTE — Progress Notes (Signed)
 Patient is in office today for a nurse visit for B12 Injection. Patient Injection was given in the  Left deltoid. Patient tolerated injection well.

## 2024-04-19 NOTE — ED Triage Notes (Signed)
 Pt arrived reporting left leg calf pain. Denies recent travel or shob. Pain present for 4 days now. Denies inj. Endorses being on 2 birth controls.

## 2024-04-19 NOTE — Progress Notes (Signed)
 Left lower extremity venous duplex has been completed. Preliminary results can be found in CV Proc through chart review.  Results were given to [facility] PA.  04/19/24 5:08 PM Cathlyn Collet RVT

## 2024-04-20 ENCOUNTER — Ambulatory Visit: Admitting: Family Medicine

## 2024-04-25 ENCOUNTER — Ambulatory Visit (INDEPENDENT_AMBULATORY_CARE_PROVIDER_SITE_OTHER): Admitting: Family Medicine

## 2024-04-25 ENCOUNTER — Inpatient Hospital Stay: Admitting: Internal Medicine

## 2024-04-25 ENCOUNTER — Encounter: Payer: Self-pay | Admitting: Family Medicine

## 2024-04-25 VITALS — BP 118/80 | HR 64 | Temp 98.9°F | Ht 69.0 in | Wt 235.5 lb

## 2024-04-25 DIAGNOSIS — I82432 Acute embolism and thrombosis of left popliteal vein: Secondary | ICD-10-CM | POA: Diagnosis not present

## 2024-04-25 MED ORDER — RIVAROXABAN 20 MG PO TABS
20.0000 mg | ORAL_TABLET | Freq: Every day | ORAL | 4 refills | Status: DC
Start: 2024-04-25 — End: 2024-08-25

## 2024-04-25 NOTE — Patient Instructions (Addendum)
 Instructions for switching to lovenox prior to hysterectomy:  Stop xarelto  48 hours before surgery, start lovenox twice a day for the 48 hours before surgery. HOLD lovenox the morning of surgery. Then then evening after surgery immediately resume the xarelto .  --Ask the GYN if this is the protocol

## 2024-04-25 NOTE — Progress Notes (Signed)
 Established Patient Office Visit  Subjective   Patient ID: [name], female    DOB: Jul 25, 1991  Age: 33 y.o. MRN: [id number]  Chief Complaint  Patient presents with   Hospitalization Follow-up    Pt is here for hospital follow up. She is here with her mother, states that there is a family history of blood clots in the family, uncle, aunt and grandmother on her mother's side have all had blood clots in the past. States that they were in younger and older family members. None are currently on life long blood thinners. Was given Xarelto  starter pack. States that she has been on OCP's for a while but didn't have trouble until the dose was increased. Is supposed to have a hysterectomy on 7/21.     Current Outpatient Medications  Medication Instructions   metFORMIN  (GLUCOPHAGE -XR) 500 mg, Oral, Daily with breakfast   norgestimate -ethinyl estradiol  (ORTHO-CYCLEN) 0.25-35 MG-MCG tablet 1 tablet, Oral, Daily   norgestimate -ethinyl estradiol  (ORTHO-CYCLEN) 0.25-35 MG-MCG tablet Take 5 tablets by mouth daily for 1 day, THEN 4 tablets daily for 1 day, THEN 3 tablets daily for 1 day, THEN 2 tablets daily for 1 day.   olmesartan  (BENICAR ) 20 mg, Oral, Daily   RIVAROXABAN  (XARELTO ) VTE STARTER PACK (15 & 20 MG) Follow package directions: Take one 15mg  tablet by mouth twice a day. On day 22, switch to one 20mg  tablet once a day. Take with food.   rivaroxaban  (XARELTO ) 20 mg, Oral, Daily with supper   Vitamin D3 1.25 mg, Oral, Every 7 days    Patient Active Problem List   Diagnosis Date Noted   Vitamin B12 deficiency    Hypertension 04/21/2023   Nonintractable episodic headache 04/21/2023   Felon of finger of right hand 11/11/2022   Well woman exam with routine gynecological exam 10/06/2018   Exposure to STD 10/06/2018   Surveillance for medroxyprogesterone  contraception 10/06/2018   Seizures (HCC) 06/15/2011   Migraine headache 11/13/2010   Thrombophlebitis of superficial veins of  upper extremities 11/13/2010   Conversion disorder 11/01/2010      Review of Systems  Constitutional:  Negative for chills and fever.  Respiratory:  Negative for shortness of breath.   All other systems reviewed and are negative.     Objective:     BP 118/80   Pulse 64   Temp 98.9 F (37.2 C) (Oral)   Ht 5' 9 (1.753 m)   Wt 235 lb 8 oz (106.8 kg)   LMP 03/25/2024 (Exact Date)   SpO2 98%   BMI 34.78 kg/m    Physical Exam Vitals reviewed.  Constitutional:      Appearance: Normal appearance. She is obese.  Cardiovascular:     Pulses: Normal pulses.  Musculoskeletal:     Right lower leg: No edema.     Left lower leg: No tenderness. No edema.     Comments: There is mild increase in circumference of the left leg vs the right leg but no redness, HOMAN's sign is negative.   Neurological:     Mental Status: She is alert and oriented to person, place, and time. Mental status is at baseline.      No results found for any visits on 04/25/24.    The ASCVD Risk score (Arnett DK, et al., 2019) failed to calculate for the following reasons:   The 2019 ASCVD risk score is only valid for ages 90 to 19    Assessment & Plan:  Acute deep vein thrombosis (DVT)  of popliteal vein of left lower extremity (HCC) -     Rivaroxaban ; Take 1 tablet (20 mg total) by mouth daily with supper.  Dispense: 30 tablet; Refill: 4   Reviewed patient's ED visit notes and US  of her left extremity. Course of treatment will be 3-6 months. Pt has appt with her GYN tomorrow to discuss the protocol with blood thinners and her upcoming hysterectomy. I counseled her on the options including transitioning to lovenox injections prior to her surgery-- I will defer to the GYN for management of anticoagulation around her surgery. This was likely a provoked DVT due to the use of OCP's for her excessive menstrual bleeding.   No follow-ups on file.    Heron CHRISTELLA Sharper, MD

## 2024-04-26 ENCOUNTER — Ambulatory Visit (INDEPENDENT_AMBULATORY_CARE_PROVIDER_SITE_OTHER): Admitting: Obstetrics and Gynecology

## 2024-04-26 VITALS — BP 139/88 | HR 75 | Ht 69.0 in | Wt 236.6 lb

## 2024-04-26 DIAGNOSIS — N939 Abnormal uterine and vaginal bleeding, unspecified: Secondary | ICD-10-CM | POA: Diagnosis not present

## 2024-04-26 DIAGNOSIS — I82462 Acute embolism and thrombosis of left calf muscular vein: Secondary | ICD-10-CM

## 2024-04-26 NOTE — Progress Notes (Signed)
 GYNECOLOGY VISIT  Patient name: [name] MRN [id number]  Date of birth: [birth year] Chief Complaint:   Follow-up  History:  [name] is a 33 y.o. G2P2002 being seen today for ED f/up where diagnosed with DVT and started on xarelto .    Discussed the use of AI scribe software for clinical note transcription with the patient, who gave verbal consent to proceed.  History of Present Illness [name] is a 33 year old female who presents with leg pain and a recent diagnosis of deep vein thrombosis (DVT).  She experiences pain in the back of her calf muscle, initially thought to be a muscle pull. The pain worsened over three days, prompting a visit to her primary doctor, who ordered an ultrasound. Due to scheduling delays, she went to the emergency room where an ultrasound confirmed a clot in her leg. She was started on blood thinners.  She has been on birth control pills containing estrogen and has used Depo-Provera  for years without issues. Recently, she tapered off the birth control pills and resumed regular dosing. She has never experienced a clot before and is surprised by the occurrence. She has also used TXA in the past without complications.  She reports increased bleeding since starting the blood thinners, which began the day before the visit. She is frustrated with the bleeding and its impact on her life, stating 'I'm sick of bleeding' and 'I feel like I'm dying now.'   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HCC)    Hypertension    Migraine    Right side   Prediabetes    Vaginal Pap smear, abnormal    Vitamin B12 deficiency    Vitamin D  deficiency     Past Surgical History:  Procedure Laterality Date   MASS EXCISION N/A 09/13/2013   Procedure: EXCISION OF SOFT TISSUE MASS ON BACK;  Surgeon: Earlis Ranks, MD;  Location:  SURGERY CENTER;  Service: Plastics;  Laterality: N/A;   none     WISDOM TOOTH  EXTRACTION      The following portions of the patient's history were reviewed and updated as appropriate: allergies, current medications, past family history, past medical history, past social history, past surgical history and problem list.   Health Maintenance:   Last pap     Component Value Date/Time   DIAGPAP  01/15/2021 1019    - Negative for intraepithelial lesion or malignancy (NILM)   DIAGPAP  09/01/2017 0000    NEGATIVE FOR INTRAEPITHELIAL LESIONS OR MALIGNANCY.   DIAGPAP  08/07/2016 0000    NEGATIVE FOR INTRAEPITHELIAL LESIONS OR MALIGNANCY.   DIAGPAP SHIFT IN FLORA SUGGESTIVE OF BACTERIAL VAGINOSIS. 08/07/2016 0000   HPVHIGH Negative 01/15/2021 1019   ADEQPAP  01/15/2021 1019    Satisfactory for evaluation; transformation zone component PRESENT.   ADEQPAP  09/01/2017 0000    Satisfactory for evaluation  endocervical/transformation zone component PRESENT.   ADEQPAP  08/07/2016 0000    Satisfactory for evaluation  endocervical/transformation zone component PRESENT.    High Risk HPV: Positive  Adequacy:  Satisfactory for evaluation, transformation zone component PRESENT  Diagnosis:  Atypical squamous cells of undetermined significance (ASC-US )  Last mammogram: n/a   Review of Systems:  Pertinent items are noted in HPI. Comprehensive review of systems was otherwise negative.   Objective:  Physical Exam BP 139/88   Pulse 75   Ht 5' 9 (1.753 m)   Wt 236 lb 9.6 oz (107.3 kg)  LMP 04/25/2024   BMI 34.94 kg/m    Physical Exam Vitals and nursing note reviewed.  Constitutional:      Appearance: Normal appearance.  HENT:     Head: Normocephalic and atraumatic.  Pulmonary:     Effort: Pulmonary effort is normal.  Skin:    General: Skin is warm and dry.  Neurological:     General: No focal deficit present.     Mental Status: She is alert.  Psychiatric:        Mood and Affect: Mood normal.        Behavior: Behavior normal.        Thought Content: Thought  content normal.        Judgment: Judgment normal.      Labs and Imaging VAS US  LOWER EXTREMITY VENOUS (DVT) (ONLY [facility] & [facility]) Result Date: 04/19/2024  Lower Venous DVT Study Patient Name:  [name]  Date of Exam:   04/19/2024 Medical Rec #: [id number]          Accession #:    [id number] Date of Birth: 1991/05/30          Patient Gender: F Patient Age:   65 years Exam Location:  [facility] Procedure:      VAS US  LOWER EXTREMITY VENOUS (DVT) Referring Phys: BRITNI HENDERLY --------------------------------------------------------------------------------  Indications: Pain.  Risk Factors: None identified. Comparison Study: No prior studies. Performing Technologist: Cordella Collet RVT  Examination Guidelines: A complete evaluation includes B-mode imaging, spectral Doppler, color Doppler, and power Doppler as needed of all accessible portions of each vessel. Bilateral testing is considered an integral part of a complete examination. Limited examinations for reoccurring indications may be performed as noted. The reflux portion of the exam is performed with the patient in reverse Trendelenburg.  +-----+---------------+---------+-----------+----------+--------------+ RIGHTCompressibilityPhasicitySpontaneityPropertiesThrombus Aging +-----+---------------+---------+-----------+----------+--------------+ CFV  Full           Yes      Yes                                 +-----+---------------+---------+-----------+----------+--------------+   +---------+---------------+---------+-----------+----------+--------------+ LEFT     CompressibilityPhasicitySpontaneityPropertiesThrombus Aging +---------+---------------+---------+-----------+----------+--------------+ CFV      Full           Yes      Yes                                 +---------+---------------+---------+-----------+----------+--------------+ SFJ      Full                                                         +---------+---------------+---------+-----------+----------+--------------+ FV Prox  Full                                                        +---------+---------------+---------+-----------+----------+--------------+ FV Mid   Full                                                        +---------+---------------+---------+-----------+----------+--------------+  FV DistalFull                                                        +---------+---------------+---------+-----------+----------+--------------+ PFV      Full                                                        +---------+---------------+---------+-----------+----------+--------------+ POP      Full           Yes      Yes                                 +---------+---------------+---------+-----------+----------+--------------+ PTV      None                                         Acute          +---------+---------------+---------+-----------+----------+--------------+ PERO     Full                                                        +---------+---------------+---------+-----------+----------+--------------+     Summary: RIGHT: - No evidence of common femoral vein obstruction.   LEFT: - Findings consistent with acute deep vein thrombosis involving the left posterior tibial veins.  - No cystic structure found in the popliteal fossa.  *See table(s) above for measurements and observations. Electronically signed by Debby Robertson on 04/19/2024 at 9:14:56 PM.    Final        Assessment & Plan:   Assessment & Plan Deep Vein Thrombosis (DVT) Acute DVT in calf likely due to estrogen-containing birth control. High surgical risk due to potential PE and death. Anticoagulation therapy causing increased bleeding. Has scheduled follow up with PCP after completing treatment  - Continue anticoagulation therapy to completion and will need perioperative anticoagulation  - Suspect provoked in the setting of CHC  use - Advise against surgery due to high PE risk. - Discuss alternative bleeding management options, such as progestin-only therapy or IUD. - Arrange follow-up with another GYN surgeon for second opinion if desired.  Menorrhagia Increased bleeding likely due to anticoagulation. Estrogen-containing birth control contraindicated due to DVT risk. Progestin-only therapy considered safer for bleeding management. - Consider progestin-only therapy, such as Provera . - Discuss IUD insertion to control bleeding. - Monitor hemoglobin levels for anemia.   Routine preventative health maintenance measures emphasized.  [name], MD [facility], [facility]

## 2024-04-26 NOTE — Progress Notes (Signed)
 Pt presents for ed f/u.

## 2024-05-09 ENCOUNTER — Ambulatory Visit (HOSPITAL_COMMUNITY): Admit: 2024-05-09 | Admitting: Obstetrics and Gynecology

## 2024-05-09 SURGERY — HYSTERECTOMY, TOTAL, LAPAROSCOPIC, WITH SALPINGECTOMY
Anesthesia: Choice

## 2024-05-20 ENCOUNTER — Ambulatory Visit

## 2024-05-20 DIAGNOSIS — E538 Deficiency of other specified B group vitamins: Secondary | ICD-10-CM | POA: Diagnosis not present

## 2024-05-20 MED ORDER — CYANOCOBALAMIN 1000 MCG/ML IJ SOLN
1000.0000 ug | Freq: Once | INTRAMUSCULAR | Status: AC
  Administered 2024-05-20: 1000 ug via INTRAMUSCULAR

## 2024-05-20 NOTE — Progress Notes (Signed)
 Patient is in office today for a nurse visit for B12 Injection. Patient Injection was given in the  Right deltoid. Patient tolerated injection well.

## 2024-05-25 ENCOUNTER — Encounter: Payer: Self-pay | Admitting: Family Medicine

## 2024-05-25 DIAGNOSIS — N920 Excessive and frequent menstruation with regular cycle: Secondary | ICD-10-CM

## 2024-06-13 ENCOUNTER — Other Ambulatory Visit: Payer: Self-pay | Admitting: Family Medicine

## 2024-06-13 DIAGNOSIS — E559 Vitamin D deficiency, unspecified: Secondary | ICD-10-CM

## 2024-06-14 ENCOUNTER — Encounter: Admitting: Family Medicine

## 2024-06-20 ENCOUNTER — Other Ambulatory Visit: Payer: Self-pay | Admitting: Family Medicine

## 2024-06-20 DIAGNOSIS — R7303 Prediabetes: Secondary | ICD-10-CM

## 2024-06-21 ENCOUNTER — Ambulatory Visit (INDEPENDENT_AMBULATORY_CARE_PROVIDER_SITE_OTHER): Admitting: *Deleted

## 2024-06-21 ENCOUNTER — Encounter: Payer: Self-pay | Admitting: Family Medicine

## 2024-06-21 DIAGNOSIS — E538 Deficiency of other specified B group vitamins: Secondary | ICD-10-CM | POA: Diagnosis not present

## 2024-06-21 DIAGNOSIS — G43809 Other migraine, not intractable, without status migrainosus: Secondary | ICD-10-CM

## 2024-06-21 MED ORDER — CYANOCOBALAMIN 1000 MCG/ML IJ SOLN
1000.0000 ug | Freq: Once | INTRAMUSCULAR | Status: AC
  Administered 2024-06-21: 1000 ug via INTRAMUSCULAR

## 2024-06-21 NOTE — Progress Notes (Signed)
 Per orders of Dr. Ardyth Harps, injection of B12 given by Kern Reap. Patient tolerated injection well.

## 2024-06-24 ENCOUNTER — Ambulatory Visit: Admitting: Family Medicine

## 2024-06-24 MED ORDER — IBUPROFEN 800 MG PO TABS: 800.0000 mg | ORAL_TABLET | Freq: Three times a day (TID) | ORAL | 0 refills | Status: DC | PRN

## 2024-07-06 ENCOUNTER — Other Ambulatory Visit (HOSPITAL_COMMUNITY)
Admission: RE | Admit: 2024-07-06 | Discharge: 2024-07-06 | Disposition: A | Discharge status: Home / Self Care | Source: Ambulatory Visit | Attending: Nurse Practitioner | Admitting: Nurse Practitioner

## 2024-07-06 ENCOUNTER — Ambulatory Visit (INDEPENDENT_AMBULATORY_CARE_PROVIDER_SITE_OTHER): Admitting: Nurse Practitioner

## 2024-07-06 ENCOUNTER — Encounter: Payer: Self-pay | Admitting: Nurse Practitioner

## 2024-07-06 VITALS — BP 118/80 | Ht 68.75 in | Wt 225.0 lb

## 2024-07-06 DIAGNOSIS — Z124 Encounter for screening for malignant neoplasm of cervix: Secondary | ICD-10-CM | POA: Diagnosis present

## 2024-07-06 DIAGNOSIS — Z01419 Encounter for gynecological examination (general) (routine) without abnormal findings: Secondary | ICD-10-CM | POA: Insufficient documentation

## 2024-07-06 DIAGNOSIS — Z113 Encounter for screening for infections with a predominantly sexual mode of transmission: Secondary | ICD-10-CM | POA: Diagnosis present

## 2024-07-06 DIAGNOSIS — N920 Excessive and frequent menstruation with regular cycle: Secondary | ICD-10-CM

## 2024-07-06 DIAGNOSIS — Z1331 Encounter for screening for depression: Secondary | ICD-10-CM | POA: Diagnosis not present

## 2024-07-06 NOTE — Progress Notes (Signed)
 [name] 20-Jun-1991 [id number]   History:  33 y.o. G2P2002 presents as new patient to establish care. Monthly cycles. H/O heavy periods. Seven days of heavy bleeding each month that requires changing of super plus tampons + pads ~1 hour. Was scheduled for hysterectomy in July but developed DVT while taking high doses of OCPs. On Xarelto , has follow up next month with hematology, plans for anticoagulant for 3-6 months. Has tried OCPs, Depo. Stable hemoglobin. Benign EMB March 2025. Cryosurgery years ago. Pre-diabetes managed by PCP.  Gynecologic History Patient's last menstrual period was 06/06/2024 (exact date). Period Duration (Days): 7 Period Pattern: Regular Menstrual Flow: Heavy Menstrual Control: Maxi pad, Tampon Dysmenorrhea: (!) Severe Dysmenorrhea Symptoms: Cramping Contraception/Family planning: none, same sex partner Sexually active: Yes, would like STD screening  Health Maintenance Last Pap: 01/15/2021. Results were: Normal neg HPV Last mammogram: Not indicated Last colonoscopy: Not indicated Last Dexa: Not indicated     04/20/2023   10:43 AM  Depression screen PHQ 2/9  Decreased Interest 0  Down, Depressed, Hopeless 0  PHQ - 2 Score 0  Altered sleeping 2  Tired, decreased energy 2  Change in appetite 2  Feeling bad or failure about yourself  0  Trouble concentrating 0  Moving slowly or fidgety/restless 0  Suicidal thoughts 0  PHQ-9 Score 6  Difficult doing work/chores Somewhat difficult     Past medical history, past surgical history, family history and social history were all reviewed and documented in the EPIC chart. Works in Arts administrator at [employer]. 71 and 49 yo daughters, guardian of 44 yo nephew.   ROS:  A ROS was performed and pertinent positives and negatives are included.  Exam:  Vitals:   07/06/24 1344  BP: 118/80  Weight: 225 lb (102.1 kg)  Height: 5' 8.75 (1.746 m)   Body mass index is 33.47 kg/m.  General appearance:   Normal Thyroid :  Symmetrical, normal in size, without palpable masses or nodularity. Respiratory  Auscultation:  Clear without wheezing or rhonchi Cardiovascular  Auscultation:  Regular rate, without rubs, murmurs or gallops  Edema/varicosities:  Not grossly evident Abdominal  Soft,nontender, without masses, guarding or rebound.  Liver/spleen:  No organomegaly noted  Hernia:  None appreciated  Skin  Inspection:  Grossly normal Breasts: Examined lying and sitting.   Right: Without masses, retractions, nipple discharge or axillary adenopathy.   Left: Without masses, retractions, nipple discharge or axillary adenopathy. Pelvic: External genitalia:  no lesions              Urethra:  normal appearing urethra with no masses, tenderness or lesions              Bartholins and Skenes: normal                 Vagina: normal appearing vagina with normal color and discharge, no lesions              Cervix: no lesions Bimanual Exam:  Uterus:  no masses or tenderness              Adnexa: no mass, fullness, tenderness              Rectovaginal: Deferred              Anus:  normal, no lesions  [name], CMA present as chaperone.   Assessment/Plan:  33 y.o. G2P2002 to establish care.   Well female exam with routine gynecological exam - Plan: Cytology - PAP( [place]). Education  provided on SBEs, importance of preventative screenings, current guidelines, high calcium diet, regular exercise, and multivitamin daily. Labs with PCP.  Cervical cancer screening - Plan: Cytology - PAP( [place]). Abnormal pap years ago, had cryosurgery.   Menorrhagia with regular cycle - interested in hysterectomy. Currently on Xarelto , has follow up in a few weeks. Aware will likely need periop anticoagulation. Has tried multiple hormone birth control methods. Not interested in IUD. Will schedule consult with Dr. Dallie.   Screening examination for STD (sexually transmitted disease) - Plan: Cytology - PAP( [facility]). GC/CT/Trich added to pap.   Return in about 1 year (around 07/06/2025) for Annual.    Debbie DELENA Shutter DNP, 2:34 PM 07/06/2024

## 2024-07-12 ENCOUNTER — Ambulatory Visit: Payer: Self-pay | Admitting: Nurse Practitioner

## 2024-07-12 LAB — CYTOLOGY - PAP
Adequacy: ABSENT
Chlamydia: NEGATIVE
Comment: NEGATIVE
Comment: NEGATIVE
Comment: NEGATIVE
Comment: NORMAL
Diagnosis: NEGATIVE
Diagnosis: REACTIVE
High risk HPV: NEGATIVE
Neisseria Gonorrhea: NEGATIVE
Trichomonas: NEGATIVE

## 2024-07-13 ENCOUNTER — Ambulatory Visit (INDEPENDENT_AMBULATORY_CARE_PROVIDER_SITE_OTHER): Admitting: Obstetrics and Gynecology

## 2024-07-13 ENCOUNTER — Encounter: Payer: Self-pay | Admitting: Obstetrics and Gynecology

## 2024-07-13 VITALS — BP 114/60 | Ht 68.5 in | Wt 224.8 lb

## 2024-07-13 DIAGNOSIS — Z86718 Personal history of other venous thrombosis and embolism: Secondary | ICD-10-CM

## 2024-07-13 DIAGNOSIS — N946 Dysmenorrhea, unspecified: Secondary | ICD-10-CM | POA: Diagnosis not present

## 2024-07-13 DIAGNOSIS — N809 Endometriosis, unspecified: Secondary | ICD-10-CM

## 2024-07-13 DIAGNOSIS — D500 Iron deficiency anemia secondary to blood loss (chronic): Secondary | ICD-10-CM

## 2024-07-13 DIAGNOSIS — N920 Excessive and frequent menstruation with regular cycle: Secondary | ICD-10-CM | POA: Diagnosis not present

## 2024-07-13 NOTE — Progress Notes (Signed)
 Acute Office Visit  Subjective:    Patient ID: [name], female    DOB: June 05, 1991, 33 y.o.   MRN: [id number]   HPI 33 y.o. presents today for Consult (Would like to talk about sx for a hysterectomy )  Patient was schedule to have [facility] for menorrhagia, anemia, dysmenorrhea and had a DVT and had to wait. She is on xeralto. She is aG2P2 and is raising her nephew and does not want to have more children in the future.  She is in a same sex relationship and they both are in agreement. She is frustrated with the pain and bleeding and reports it has been even heavier now on the zeralto. She is using a TP and pad q hour and the blood is pushing out the tampon.  She bleeds once a month for 7 days. She was on iron and is currently off it.  She cannot take any hormones with her h/o DVT. Patient also has a history of cryo for abnormal pap smears She feels the cramping pain in her lower buttox She would like a definitive management with the [facility] with ovarian preservation  [place]: none. Counseled on importance. She will consider this. Pap [facility]06/24 [facility]  Patient's last menstrual period was 05/24/2024 (exact date). Period Duration (Days): 7 Period Pattern: (!) Irregular Menstrual Flow: Heavy Menstrual Control: Maxi pad, Tampon Dysmenorrhea: (!) Severe Dysmenorrhea Symptoms: Cramping  Review of Systems     Objective:    OBGyn Exam  BP 114/60   Ht 5' 8.5 (1.74 m)   Wt 224 lb 12.8 oz (102 kg)   LMP 05/24/2024 (Exact Date)   BMI 33.68 kg/m  Wt Readings from Last 3 Encounters:  07/13/24 224 lb 12.8 oz (102 kg)  07/06/24 225 lb (102.1 kg)  04/26/24 236 lb 9.6 oz (107.3 kg)    US  PELVIC COMPLETE WITH TRANSVAGINAL (Accession [id number]) (Order [id number]) Imaging Date: 11/09/2023 Department: [facility] Released By: Fredia Montie ORN Authorizing: Alger Gong, MD   Exam Status  Status  Final [99]    PACS Intelerad Image Link   Show images for US  PELVIC COMPLETE WITH TRANSVAGINAL Study Result  Narrative & Impression  CLINICAL DATA:  Abnormal uterine bleeding   EXAM: TRANSABDOMINAL AND TRANSVAGINAL ULTRASOUND OF PELVIS   TECHNIQUE: Both transabdominal and transvaginal ultrasound examinations of the pelvis were performed. Transabdominal technique was performed for global imaging of the pelvis including uterus, ovaries, adnexal regions, and pelvic cul-de-sac. It was necessary to proceed with endovaginal exam following the transabdominal exam to visualize the endometrium.   COMPARISON:  None Available.   FINDINGS: Uterus   Measurements: 8.3 x 4.5 x 5.4 cm = volume: 105.2 mL. No fibroids or other mass visualized.   Endometrium   Thickness: 3.9 mm.  Small amount of fluid within the endometrium.   Right ovary   Measurements: 3.4 x 2.9 x 2.8 cm = volume: 14 mL. Normal appearance/no adnexal mass.   Left ovary   Measurements: 3.1 x 2.3 x 1.4 cm = volume: 5.2 mL. Normal appearance/no adnexal mass.   Other findings   No abnormal free fluid.   IMPRESSION: Endometrium measures 4 mm. If bleeding remains unresponsive to hormonal or medical therapy, sonohysterogram should be considered for focal lesion work-up. (Ref: Radiological Reasoning: Algorithmic Workup of Abnormal Vaginal Bleeding with Endovaginal Sonography and Sonohysterography. AJR 2008; [id number]:D31-26)     Electronically Signed   By: Bard Moats M.D.   On:  11/09/2023 19:31      Result History  US  PELVIC COMPLETE WITH TRANSVAGINAL (Order #[id number]) on 11/09/2023 - Order Result History Report  MyChart Results Release  MyChart Status: Active  Results Release   Encounter-Level Documents on 11/09/2023:  Electronic signature on 11/09/2023 12:37 PM - E-signed Electronic signature on 11/09/2023 12:36 PM - E-signed      Order-Level Documents:  There are no order-level documents.   Hospital Account-Level  Documents:  There are no hospital account-level documents. Vitals   Assessment & Plan:  Menorrhagia H/o dvt on xarelto  Dysmenorrhea, felt in back, likely endometriosis H/o abnormal pap smears in the past with cryo Anemia  Desires definitive management  Symptomatic fibroid uterus:  Counseled on all options.  She would like to have the [facility].  Counseled extensively on the procedure including but not limited to what to expect and risks and benefits.  Counseled on postop care and pelvic rest until cleared after the surgery with restricted lifting for 6 weeks after.  Counseled on the benefits of the robotic procedure with faster return to daily activities, improved outcomes, and less risk for complications.  Counseled on option for excision of endometriosis if seen and in a safe location, at the same time and she agreed. She would like to have this scheduled. Counseled on the importance of ovarian preservation She is ready to schedule and testing is up to date. Surgery scheduling placed 40 minutes spent on reviewing records, imaging,  and one on one patient time and counseling patient and documentation Dr. Glennon Almarie [name] Glennon

## 2024-07-14 ENCOUNTER — Encounter: Payer: Self-pay | Admitting: Obstetrics and Gynecology

## 2024-07-14 DIAGNOSIS — Z86718 Personal history of other venous thrombosis and embolism: Secondary | ICD-10-CM

## 2024-07-14 DIAGNOSIS — N809 Endometriosis, unspecified: Secondary | ICD-10-CM

## 2024-07-14 DIAGNOSIS — N920 Excessive and frequent menstruation with regular cycle: Secondary | ICD-10-CM

## 2024-07-14 DIAGNOSIS — D500 Iron deficiency anemia secondary to blood loss (chronic): Secondary | ICD-10-CM

## 2024-07-14 DIAGNOSIS — N946 Dysmenorrhea, unspecified: Secondary | ICD-10-CM

## 2024-07-14 NOTE — Telephone Encounter (Signed)
 Spoke with patient, advised per Dr. Glennon. Patient verbalizes understanding. Precautions reviewed. Patient aware to contact office if any additional questions/concerns.   Encounter closed.

## 2024-07-14 NOTE — Telephone Encounter (Signed)
 Spoke with patient. Patient reports she wears pad and tampon at same time, has changed 3 times this morning. Reports nausea, no vomiting. Has tried eating crackers. Unable to take motrin , on blood thinner. Taking 800 mg of Tylenol  q2 hours with no relief. Advised the maximum dose of Tylenol  in 24 hrs is 4000 mg, do not exceed.   Denies SOB, lightheadedness, weakness, fatigue.   Reviewed surgery referral, advised I will contact her once surgery benefits have been reviewed. I will provide update to Dr. Glennon and f/u with recommendations, patient agreeable.   Routing to Dr. Glennon to review.

## 2024-07-18 ENCOUNTER — Ambulatory Visit: Admitting: Obstetrics and Gynecology

## 2024-07-21 ENCOUNTER — Ambulatory Visit: Admitting: *Deleted

## 2024-07-21 ENCOUNTER — Ambulatory Visit

## 2024-07-21 DIAGNOSIS — E538 Deficiency of other specified B group vitamins: Secondary | ICD-10-CM

## 2024-07-21 MED ORDER — CYANOCOBALAMIN 1000 MCG/ML IJ SOLN
1000.0000 ug | Freq: Once | INTRAMUSCULAR | Status: AC
  Administered 2024-07-21: 1000 ug via INTRAMUSCULAR

## 2024-07-21 NOTE — Progress Notes (Signed)
 Per orders of Dr. Ardyth Harps, injection of B12 given by Kern Reap. Patient tolerated injection well.

## 2024-07-22 NOTE — Telephone Encounter (Signed)
 See surgery referral.   Encounter closed.

## 2024-07-25 DIAGNOSIS — Z20822 Contact with and (suspected) exposure to covid-19: Secondary | ICD-10-CM | POA: Diagnosis not present

## 2024-07-25 DIAGNOSIS — J029 Acute pharyngitis, unspecified: Secondary | ICD-10-CM | POA: Diagnosis not present

## 2024-07-26 ENCOUNTER — Ambulatory Visit: Payer: Self-pay | Admitting: Family Medicine

## 2024-07-26 ENCOUNTER — Encounter: Payer: Self-pay | Admitting: Family Medicine

## 2024-07-26 ENCOUNTER — Ambulatory Visit (INDEPENDENT_AMBULATORY_CARE_PROVIDER_SITE_OTHER): Admitting: Family Medicine

## 2024-07-26 VITALS — BP 110/76 | Temp 98.2°F | Ht 68.5 in | Wt 230.0 lb

## 2024-07-26 DIAGNOSIS — E559 Vitamin D deficiency, unspecified: Secondary | ICD-10-CM | POA: Diagnosis not present

## 2024-07-26 DIAGNOSIS — R7989 Other specified abnormal findings of blood chemistry: Secondary | ICD-10-CM | POA: Diagnosis not present

## 2024-07-26 DIAGNOSIS — E538 Deficiency of other specified B group vitamins: Secondary | ICD-10-CM

## 2024-07-26 DIAGNOSIS — D649 Anemia, unspecified: Secondary | ICD-10-CM

## 2024-07-26 DIAGNOSIS — I10 Essential (primary) hypertension: Secondary | ICD-10-CM | POA: Diagnosis not present

## 2024-07-26 DIAGNOSIS — I82432 Acute embolism and thrombosis of left popliteal vein: Secondary | ICD-10-CM

## 2024-07-26 DIAGNOSIS — R7303 Prediabetes: Secondary | ICD-10-CM | POA: Diagnosis not present

## 2024-07-26 LAB — COMPREHENSIVE METABOLIC PANEL WITH GFR
ALT: 12 U/L (ref 0–35)
AST: 12 U/L (ref 0–37)
Albumin: 3.9 g/dL (ref 3.5–5.2)
Alkaline Phosphatase: 49 U/L (ref 39–117)
BUN: 8 mg/dL (ref 6–23)
CO2: 27 meq/L (ref 19–32)
Calcium: 8.9 mg/dL (ref 8.4–10.5)
Chloride: 105 meq/L (ref 96–112)
Creatinine, Ser: 0.61 mg/dL (ref 0.40–1.20)
GFR: 117.06 mL/min (ref >60.00)
Glucose, Bld: 115 mg/dL — ABNORMAL HIGH (ref 70–99)
Potassium: 4.1 meq/L (ref 3.5–5.1)
Sodium: 138 meq/L (ref 135–145)
Total Bilirubin: 0.4 mg/dL (ref 0.2–1.2)
Total Protein: 6.5 g/dL (ref 6.0–8.3)

## 2024-07-26 LAB — VITAMIN B12: Vitamin B-12: 499 pg/mL (ref 211–911)

## 2024-07-26 LAB — CBC WITH DIFFERENTIAL/PLATELET
Basophils Absolute: 0 K/uL (ref 0.0–0.1)
Basophils Relative: 0.6 % (ref 0.0–3.0)
Eosinophils Absolute: 0.2 K/uL (ref 0.0–0.7)
Eosinophils Relative: 3 % (ref 0.0–5.0)
HCT: 37.6 % (ref 36.0–46.0)
Hemoglobin: 11.8 g/dL — ABNORMAL LOW (ref 12.0–15.0)
Lymphocytes Relative: 31 % (ref 12.0–46.0)
Lymphs Abs: 1.6 K/uL (ref 0.7–4.0)
MCHC: 31.3 g/dL (ref 30.0–36.0)
MCV: 72.3 fl — ABNORMAL LOW (ref 78.0–100.0)
Monocytes Absolute: 0.4 K/uL (ref 0.1–1.0)
Monocytes Relative: 6.9 % (ref 3.0–12.0)
Neutro Abs: 3.1 K/uL (ref 1.4–7.7)
Neutrophils Relative %: 58.5 % (ref 43.0–77.0)
Platelets: 240 K/uL (ref 150.0–400.0)
RBC: 5.2 Mil/uL — ABNORMAL HIGH (ref 3.87–5.11)
RDW: 14.2 % (ref 11.5–15.5)
WBC: 5.3 K/uL (ref 4.0–10.5)

## 2024-07-26 LAB — VITAMIN D 25 HYDROXY (VIT D DEFICIENCY, FRACTURES): VITD: 15.27 ng/mL — ABNORMAL LOW (ref 30.00–100.00)

## 2024-07-26 LAB — HEMOGLOBIN A1C: Hgb A1c MFr Bld: 5.7 % (ref 4.6–6.5)

## 2024-07-26 MED ORDER — VITAMIN D3 1.25 MG (50000 UT) PO CAPS: 1.2500 mg | ORAL_CAPSULE | ORAL | 0 refills | Status: DC

## 2024-07-26 NOTE — Progress Notes (Signed)
 Established Patient Office Visit   Subjective:  Patient ID: [name], female    DOB: May 14, 1991  Age: 33 y.o. MRN: [id number]  Chief Complaint  Patient presents with   Medical Management of Chronic Issues    3 month follow up   [name] presents to the clinic today for management of chronic issues.   -HTN: BP today in office is 110/76. Is taking Olmesartan  20 mg daily. Does not check BP at home.  -Pre-DM: Taking Metformin  500 mg daily. Does not check her sugar.  -Vit B12 Deficiency: Receives monthly B12 injections. Last vitamin B12 was 154 on 03/24/2024.   -Prior Left Lower Extremity DVT: Taking Xarelto  20 mg daily. She was diagnosed with DVT on 04/19/2024 at [facility]. She would like to come off medications as soon as possible.   -Vitamin D  Deficiency: Patients previous vitamin D  level was 9.11 on 03/23/2024. She is currently not taking any supplement. Previously prescribed vitamin D  supplement.   She is penciled in scheduled to have a hysterectomy in December, awaiting insurance clearance.   HPI Review of Systems  Constitutional: Negative.   HENT: Negative.    Eyes: Negative.   Respiratory: Negative.    Cardiovascular: Negative.   Gastrointestinal: Negative.   Genitourinary: Negative.   Musculoskeletal: Negative.   Skin: Negative.   Neurological: Negative.   Psychiatric/Behavioral: Negative.      Objective:    BP 110/76   Temp 98.2 F (36.8 C) (Oral)   Ht 5' 8.5 (1.74 m)   Wt 230 lb (104.3 kg)   LMP 05/24/2024 (Exact Date)   BMI 34.46 kg/m  BP Readings from Last 3 Encounters:  07/26/24 110/76  07/13/24 114/60  07/06/24 118/80   Wt Readings from Last 3 Encounters:  07/26/24 230 lb (104.3 kg)  07/13/24 224 lb 12.8 oz (102 kg)  07/06/24 225 lb (102.1 kg)   Physical Exam Constitutional:      Appearance: Normal appearance.  Cardiovascular:     Rate and Rhythm: Normal rate and regular rhythm.     Pulses: Normal pulses.          Dorsalis  pedis pulses are 2+ on the right side and 2+ on the left side.     Heart sounds: Normal heart sounds.  Pulmonary:     Effort: Pulmonary effort is normal.     Breath sounds: Normal breath sounds.  Musculoskeletal:        General: Normal range of motion.     Right lower leg: Normal. No swelling or tenderness. No edema.     Left lower leg: Normal. No swelling or tenderness. No edema.  Skin:    General: Skin is warm and dry.  Neurological:     General: No focal deficit present.     Mental Status: She is alert and oriented to person, place, and time. Mental status is at baseline.  Psychiatric:        Mood and Affect: Mood normal.        Behavior: Behavior normal.        Thought Content: Thought content normal.        Judgment: Judgment normal.     Assessment & Plan:  Hypertension, unspecified type -     Comprehensive metabolic panel with GFR  Vitamin D  deficiency, unspecified -     VITAMIN D  25 Hydroxy (Vit-D Deficiency, Fractures)  Vitamin B12 deficiency -     Vitamin B12  Prediabetes -     Hemoglobin A1c  Abnormal CBC -     CBC with Differential/Platelet  Acute deep vein thrombosis (DVT) of popliteal vein of left lower extremity (HCC) -     US  Venous Img Lower Unilateral Left (DVT); Future  -Tdap: upload to MyChart. -Influenza: upload to MyChart. -COVID: Denies. -Hep B: unsure, upload to MyChart. -HPV: Denies.  -Discussed management of chronic conditions. Continue to take all medications. -Ordered ultrasound to see improvement of prior DVT. If they do not call to schedule within 1 weeks, please let the provider know. The provider will follow up via MyChart or phone call with the results. -Ordered labs: CBC, CMP, A1c, Vit D/B12.  Follow up in 3 months.  JoAnna Williamson, NP

## 2024-07-26 NOTE — Patient Instructions (Addendum)
 It was nice to see you today! Today we: -Discussed management of chronic conditions. Continue to take all medications. -Ordered ultrasound to see improvement of prior DVT. If they do not call to schedule within 1 weeks, please let the provider know. The provider will follow up via MyChart or phone call with the results. -Ordered labs: CBC, CMP, A1c, Vit D/B12.  -Please upload Tdap, Influenza, and Hepatitis B vaccination records to MyChart if possible. Follow up in 3 months.

## 2024-07-27 ENCOUNTER — Ambulatory Visit: Payer: Self-pay | Admitting: Family Medicine

## 2024-07-27 ENCOUNTER — Ambulatory Visit (INDEPENDENT_AMBULATORY_CARE_PROVIDER_SITE_OTHER)

## 2024-07-27 ENCOUNTER — Ambulatory Visit
Admission: RE | Admit: 2024-07-27 | Discharge: 2024-07-27 | Disposition: A | Discharge status: Home / Self Care | Source: Ambulatory Visit | Attending: Family Medicine | Admitting: Family Medicine

## 2024-07-27 DIAGNOSIS — D649 Anemia, unspecified: Secondary | ICD-10-CM | POA: Diagnosis not present

## 2024-07-27 DIAGNOSIS — I82432 Acute embolism and thrombosis of left popliteal vein: Secondary | ICD-10-CM

## 2024-07-27 LAB — IRON: Iron: 72 ug/dL (ref 42–145)

## 2024-07-27 LAB — FERRITIN: Ferritin: 35 ng/mL (ref 10.0–291.0)

## 2024-07-27 NOTE — Telephone Encounter (Signed)
 Spoke with patient, see surgery referral, planning for surgery 09/23/24.   Patient reports cramping with menses 9 out of 10 on pain scale, bleeding is heavy. Changing tampon and pad 2-3 times per hour. Reports headache. Denies SOB, weakness, dizziness, fatigue. Menses started on 07/24/24. Pain unrelieved by tylenol .   Patient has Hx of DVT, on Xarelto .   Patient asking if there is anything to help with bleeding and cramping?   Advised I will provide update to Dr. Glennon and return call with recommendations. Patient agreeable.   Dr. Glennon -please review.

## 2024-07-29 NOTE — Telephone Encounter (Signed)
 I spoke to patient to follow up with her & give her Dr Majel recommendations. Patient states her cramping is better today but her bleeding is the same. Patient aware to go to the ER for evaluation if bleeding increases, lightheaded or severe cramping. Patient agrees. Patient states her pcp took her off of the xarelto  yesterday. Routing to jill to follow up with patient regarding Dr Majel recommendations. Routing to Dr Glennon also.

## 2024-08-01 MED ORDER — LEUPROLIDE ACETATE 3.75 MG IM KIT: 3.7500 mg | PACK | Freq: Once | INTRAMUSCULAR | 0 refills | Status: AC

## 2024-08-01 NOTE — Addendum Note (Signed)
 Addended by: BRUTUS KATE SAILOR on: 08/01/2024 01:41 PM   Modules accepted: Orders

## 2024-08-01 NOTE — Telephone Encounter (Signed)
 Medicaid sterilization consent completed on 07/27/24.   Call placed to patient. Reports bleeding has stopped, pain has subsided. Patient states US  of LLE completed on 07/27/24, negative for DVT, stopped Xarelto  per PCP.   Advised I am working on earlier surgery dates, will return call once this is confirmed. Patient states she is flexible.   Advised [facility], [facility]. Advised may require PA or to be filled through specialty pharmacy, patient will f/u with pharmacy and return call with any updates. Advised medication will need to be given in office. Patient agreeable/   Routing to Dr. Glennon LIPS.

## 2024-08-02 NOTE — Telephone Encounter (Signed)
 Spoke with patient. Patient states she spoke with pharmacy, Lupron scheduled to be shipped to [facility] on 08/10/24. Advised office will call to schedule once received.   Advised I am awaiting confirmation of an assist for surgery on 08/30/24, surgery will stay as scheduled for 12/3 until this is confirmed. I will return call with updates. Patient appreciative of call.

## 2024-08-04 NOTE — Telephone Encounter (Signed)
 See surgery referral.   Call place to [facility], spoke with Clancy. Rx for Lupron 3.75 injection discontinued.

## 2024-08-09 DIAGNOSIS — Z0289 Encounter for other administrative examinations: Secondary | ICD-10-CM

## 2024-08-09 NOTE — Telephone Encounter (Signed)
 Fax sent in from [facility] pharmacy stating Directions unclear. States they need the following additional information prior to sending out medication.  Information are needing : -Dose of medication  -Route of administration  -Frequency of doses  - ICD10 code

## 2024-08-09 NOTE — Telephone Encounter (Signed)
 Call placed to [facility] at [phone], spoke with Nat.   Advised Lupron Rx discontinued on 08/04/24, spoke with Clancy. Nat confirmed notes in their system, Rx has been cancelled. No medication on profile.   Routing FYI.   No additional Rx needs to be sent. Patient is scheduled for surgery on 08/30/24.

## 2024-08-12 ENCOUNTER — Ambulatory Visit (INDEPENDENT_AMBULATORY_CARE_PROVIDER_SITE_OTHER): Admitting: Obstetrics and Gynecology

## 2024-08-12 ENCOUNTER — Encounter: Payer: Self-pay | Admitting: Obstetrics and Gynecology

## 2024-08-12 VITALS — BP 122/80 | Ht 68.5 in | Wt 228.2 lb

## 2024-08-12 DIAGNOSIS — N920 Excessive and frequent menstruation with regular cycle: Secondary | ICD-10-CM

## 2024-08-12 DIAGNOSIS — Z01818 Encounter for other preprocedural examination: Secondary | ICD-10-CM

## 2024-08-12 MED ORDER — ENOXAPARIN SODIUM 40 MG/0.4ML IJ SOSY: 40.0000 mg | PREFILLED_SYRINGE | INTRAMUSCULAR | 0 refills | Status: DC

## 2024-08-12 MED ORDER — OXYCODONE HCL 5 MG PO TABS: 5.0000 mg | ORAL_TABLET | ORAL | 0 refills | Status: DC | PRN

## 2024-08-12 MED ORDER — METOCLOPRAMIDE HCL 10 MG PO TABS: 10.0000 mg | ORAL_TABLET | Freq: Three times a day (TID) | ORAL | 0 refills | Status: DC | PRN

## 2024-08-12 MED ORDER — IBUPROFEN 800 MG PO TABS: 800.0000 mg | ORAL_TABLET | Freq: Three times a day (TID) | ORAL | 1 refills | Status: DC | PRN

## 2024-08-12 NOTE — Progress Notes (Signed)
 PREOP RLH, bilateral salpingectomy, cystoscopy  Subjective:    Patient ID: [name], female    DOB: 1991/09/18, 33 y.o.   MRN: [id number]   HPI 33 y.o. presents today for Pre-op Exam (SX consult Hysterectomy/Pap-07/06/24 )  Patient was schedule to have RLH for menorrhagia, anemia, dysmenorrhea and had a DVT and had to wait. She is on xeralto. She is aG2P2 and is raising her nephew and does not want to have more children in the future.  She is in a same sex relationship and they both are in agreement. She is frustrated with the pain and bleeding and reports it has been even heavier now on the xeralto.  She has stopped the xeralto now with her PCP. She is using a TP and pad q hour and the blood is pushing out the tampon.  She bleeds once a month for 7 days. She was on iron and is currently off it.  She cannot take any hormones with her h/o DVT. Patient also has a history of cryo for abnormal pap smears She feels the cramping pain in her lower buttox She would like a definitive management with the [facility] with ovarian preservation  Gardesil: none. Counseled on importance. She will consider this. Pap [facility]06/[facility]  Patient's last menstrual period was 08/10/2024 (exact date). Period Cycle (Days): 28 Period Duration (Days): 7 Period Pattern: Regular Menstrual Flow: Light Menstrual Control: Maxi pad Dysmenorrhea: (!) Severe Dysmenorrhea Symptoms: Cramping  Review of Systems     Objective:    OBGyn Exam  BP 122/80   Ht 5' 8.5 (1.74 m)   Wt 228 lb 3.2 oz (103.5 kg)   LMP 08/10/2024 (Exact Date)   BMI 34.19 kg/m  Wt Readings from Last 3 Encounters:  08/12/24 228 lb 3.2 oz (103.5 kg)  07/26/24 230 lb (104.3 kg)  07/13/24 224 lb 12.8 oz (102 kg)    US  PELVIC COMPLETE WITH TRANSVAGINAL (Accession [id number]) (Order [id number]) Imaging Date: 11/09/2023 Department: [facility] Released By: Fredia Montie ORN Authorizing: Alger Gong, MD   Exam Status  Status  Final [99]   PACS Intelerad Image Link   Show images for US  PELVIC COMPLETE WITH TRANSVAGINAL Study Result  Narrative & Impression  CLINICAL DATA:  Abnormal uterine bleeding   EXAM: TRANSABDOMINAL AND TRANSVAGINAL ULTRASOUND OF PELVIS   TECHNIQUE: Both transabdominal and transvaginal ultrasound examinations of the pelvis were performed. Transabdominal technique was performed for global imaging of the pelvis including uterus, ovaries, adnexal regions, and pelvic cul-de-sac. It was necessary to proceed with endovaginal exam following the transabdominal exam to visualize the endometrium.   COMPARISON:  None Available.   FINDINGS: Uterus   Measurements: 8.3 x 4.5 x 5.4 cm = volume: 105.2 mL. No fibroids or other mass visualized.   Endometrium   Thickness: 3.9 mm.  Small amount of fluid within the endometrium.   Right ovary   Measurements: 3.4 x 2.9 x 2.8 cm = volume: 14 mL. Normal appearance/no adnexal mass.   Left ovary   Measurements: 3.1 x 2.3 x 1.4 cm = volume: 5.2 mL. Normal appearance/no adnexal mass.   Other findings   No abnormal free fluid.   IMPRESSION: Endometrium measures 4 mm. If bleeding remains unresponsive to hormonal or medical therapy, sonohysterogram should be considered for focal lesion work-up. (Ref: Radiological Reasoning: Algorithmic Workup of Abnormal Vaginal Bleeding with Endovaginal Sonography and Sonohysterography. AJR 2008; 808:D31-26)     Electronically Signed  By: Bard Moats M.D.   On: 11/09/2023 19:31      Result History  US  PELVIC COMPLETE WITH TRANSVAGINAL (Order #[id number]) on 11/09/2023 - Order Result History Report  MyChart Results Release  MyChart Status: Active  Results Release   Encounter-Level Documents on 11/09/2023:  Electronic signature on 11/09/2023 12:37 PM - E-signed Electronic signature on 11/09/2023 12:36 PM - E-signed      Order-Level  Documents:  There are no order-level documents.   Hospital Account-Level Documents:  There are no hospital account-level documents. Vitals   ago   SURGICAL PATHOLOGY SURGICAL PATHOLOGY CASE: [id number] PATIENT: [name] Surgical Pathology Report     Clinical History: AUB (cm)     FINAL MICROSCOPIC DIAGNOSIS:  A. ENDOMETRIUM, BIOPSY: Inactive endometrial glands with stromal progestational changes. Negative for endometrial intraepithelial neoplasia (EIN) and malignancy.    Assessment & Plan:  Menorrhagia H/o dvt  Dysmenorrhea, felt in back, likely endometriosis H/o abnormal pap smears in the past with cryo Anemia  Desires definitive management  Symptomatic fibroid uterus:  Counseled on all options.  She would like to have the [facility].  Counseled extensively on the procedure including but not limited to what to expect and risks and benefits.  Counseled on postop care and pelvic rest until cleared after the surgery with restricted lifting for 6 weeks after.  Counseled on the benefits of the robotic procedure with faster return to daily activities, improved outcomes, and less risk for complications.  Counseled on option for excision of endometriosis if seen and in a safe location, at the same time and she agreed. She would like to have this scheduled. Counseled on the importance of ovarian preservation She is ready to schedule and testing is up to date. Surgery scheduling placed 20 minutes spent on reviewing records, imaging,  and one on one patient time and counseling patient and documentation Dr. Glennon Almarie MARLA Glennon

## 2024-08-12 NOTE — Patient Instructions (Addendum)
 Robotic Laparoscopic Hysterectomy, Care After  The following information offers guidance on how to care for yourself after your procedure. Your health care provider may also give you more specific instructions. If you have problems or questions, contact your health care provider. What can I expect after the procedure? After the procedure, it is common to have: Pain, bruising, and numbness around your incisions. Tiredness (fatigue).  Abdominal bloating Poor appetite. Chest discomfort that radiates to your shoulder from the carbon dioxide gas for a few days after  Vaginal discharge or spotting. You will need to use a sanitary pad after this procedure.  HEAVY BLEEDING LIKE A PERIOD IS NOT NORMAL.  PLEASE CALL YOUR PROVIDER IF SOAKING A PAD or have copious discharge and or pain.  Feelings of sadness or other emotions.  If your ovaries were also removed, it is also common to have symptoms of menopause, such as hot flashes, night sweats, and lack of sleep (insomnia).  Ovaries should stay in if at all possible until at least the age of 62. Follow these instructions at home: Medicines Take over-the-counter and prescription medicines only as told by your health care provider. Ask your health care provider if the medicine prescribed to you: Requires you to avoid driving or using machinery. You cannot drive for 24 hours after anesthesia Can cause constipation. You may need to take these actions to prevent or treat constipation: Drink enough fluid to keep your urine pale yellow. Take over-the-counter or prescription medicines. Eat foods that are high in fiber, such as beans, whole grains, and fresh fruits and vegetables. Limit foods that are high in fat and processed sugars, such as fried or sweet foods.  Also, avoid spicy foods.  NAUSEA IS COMMON THE FIRST NIGHT OF SURGERY.  IF IT LASTS BEYOND 24 HOURS, CALL YOUR PROVIDER.  NAUSEA MEDICATION WAS GIVEN AT YOUR PREOP APPOINTMENT THAT YOU CAN TAKE  AFTER SURGERY. Incision care  Follow instructions from your health care provider about how to take care of your incisions. Make sure you: LEAVE INCISION OPEN AND DRY-NO BANDAGES Leave stitches (sutures), skin glue, or adhesive strips in place UNTIL 2 WEEKS THEN REMOVE IN THE SHOWER.  If adhesive strip edges start to loosen and curl up, you may trim the loose edges. Check your incision areas every day for signs of infection. Check for: More redness, swelling, or pain. Fluid or blood. Warmth. Pus or a bad smell. Activity  Rest as told by your health care provider. Avoid sitting for a long time without moving. Get up to take short walks every 1-2 hours. This is important to improve blood flow and breathing. Ask for help if you feel weak or unsteady.  If you are sore or tired, rest. Return to your normal activities as told by your health care provider. Ask your health care provider what activities are safe for you. Do not lift, push or pull anything that is heavier than 13 lb (4.5 kg), or the limit that you are told, for 6 WEEKS after surgery or until your health care provider says that it is safe. If you were given a sedative during the procedure, it can affect you for several hours. Do not drive or operate machinery until your health care provider says that it is safe. Lifestyle Do not use any products that contain nicotine or tobacco. These products include cigarettes, chewing tobacco, and vaping devices, such as e-cigarettes. These can delay healing after surgery. If you need help quitting, ask your health care provider.  Do not drink alcohol until your health care provider approves. Take a daily multivitamin and keep a high protein diet for wound healing  DO NOT HAVE INTERCOURSE UNTIL YOU ARE INSTRUCTED THAT IT IS SAFE TO DO SO  Post operative appointments need to be scheduled at 2, 6 and 10 weeks.  You can come anytime before these with any concerns.   Discussed and reviewed with patient  risks with early intercourse or use of any foreign objects (externally or internally) can increase your risk including but not limited to the risk of vaginal cuff separation and or infection, risks for bowel involvement, risk for emergent surgery, and hospital admission with need for antibiotics.  Discussed in cases with cuff separation and bowel involvement there may be the need for colostomy placement as well.  In no situation should she have intercourse unless cleared to do so.  This can be anywhere from 10 weeks or longer after surgery.  General instructions YOU MAY TAKE SHOWERS ONLY FOR 2 WEEKS AFTER SURGERY, THEN YOU MAY USE TUBS AND HOT TUBS OR SWIM AFTER THAT Do not douche, use tampons, or have sex for at least 10 weeks, or possibly longer. You will need to have an exam done in the office to be cleared to have intercourse. If you struggle with physical or emotional changes after your procedure, speak with your health care provider or a therapist.  IF YOU HAVE BURNING WITH URINATION, PLEASE CALL YOUR DOCTOR. BLADDER INFECTIONS MAY OCCUR AFTER SURGERY Try to have someone at home with you for the first week to help with your daily chores.  Most patients are driving by the end of the first week after the robotic hysterectomy. Wear compression stockings as told by your health care provider. These stockings help to prevent blood clots and reduce swelling in your legs. Keep all follow-up visits. This is important. Contact a health care provider if: You have any of these signs of infection: Chills or a fever 148f OR GREATER. More redness, swelling, or pain around an incision. Fluid or blood coming from an incision. Warmth coming from an incision. Pus or a bad smell coming from an incision. Burning with urination. Urinary frequency or cramping.   IF YOU HAVE THESE SYMPTOMS, PLEASE CALL THE OFFICE TO COME EVALUATE FOR A BLADDER INFECTION AT [phone] An incision opens. You feel dizzy or  light-headed. You have pain or bleeding when you urinate, or you are unable to urinate. You have abnormal vaginal discharge. You have pain that does not get better with medicine. Get help right away if: You have a fever and your symptoms suddenly get worse. You have severe abdominal pain. Heavy vaginal bleeding, like a period You have chest pain or shortness of breath. You may have chest pain and shortness of breath from the CO2 gas for a few days after surgery.  This is very common.  Walking, Gas-X and motrin  will usually help relieve this discomfort You faint. You have pain, swelling, or redness in your leg.  These symptoms may represent a serious problem that is an emergency. Do not wait to see if the symptoms will go away. Get medical help right away. Call your local emergency services (911 in the [place] Do not drive yourself to the hospital. Summary  CONSTIPATION MEDICATION AFTER SURGERY: COLACE, MOM, MIRALAX , GAS X are all helpful to have on hand, if needed.  FILL ALL POSTOP MEDICATION BEFORE SURGERY   [email or web address] is a nice blog site for women preparing for the  robotic hysterectomy

## 2024-08-12 NOTE — H&P (View-Only) (Signed)
 PREOP RLH, bilateral salpingectomy, cystoscopy  Subjective:    Patient ID: [name], female    DOB: 1991/09/18, 33 y.o.   MRN: [id number]   HPI 33 y.o. presents today for Pre-op Exam (SX consult Hysterectomy/Pap-07/06/24 )  Patient was schedule to have RLH for menorrhagia, anemia, dysmenorrhea and had a DVT and had to wait. She is on xeralto. She is aG2P2 and is raising her nephew and does not want to have more children in the future.  She is in a same sex relationship and they both are in agreement. She is frustrated with the pain and bleeding and reports it has been even heavier now on the xeralto.  She has stopped the xeralto now with her PCP. She is using a TP and pad q hour and the blood is pushing out the tampon.  She bleeds once a month for 7 days. She was on iron and is currently off it.  She cannot take any hormones with her h/o DVT. Patient also has a history of cryo for abnormal pap smears She feels the cramping pain in her lower buttox She would like a definitive management with the [facility] with ovarian preservation  Gardesil: none. Counseled on importance. She will consider this. Pap smear [facility]06[facility]  Patient's last menstrual period was 08/10/2024 (exact date). Period Cycle (Days): 28 Period Duration (Days): 7 Period Pattern: Regular Menstrual Flow: Light Menstrual Control: Maxi pad Dysmenorrhea: (!) Severe Dysmenorrhea Symptoms: Cramping  Review of Systems     Objective:    OBGyn Exam  BP 122/80   Ht 5' 8.5 (1.74 m)   Wt 228 lb 3.2 oz (103.5 kg)   LMP 08/10/2024 (Exact Date)   BMI 34.19 kg/m  Wt Readings from Last 3 Encounters:  08/12/24 228 lb 3.2 oz (103.5 kg)  07/26/24 230 lb (104.3 kg)  07/13/24 224 lb 12.8 oz (102 kg)    US  PELVIC COMPLETE WITH TRANSVAGINAL (Accession [id number]) (Order [id number]) Imaging Date: 11/09/2023 Department: [facility] Released By: Fredia Montie ORN Authorizing: Alger Gong, MD   Exam Status  Status  Final [99]   PACS Intelerad Image Link   Show images for US  PELVIC COMPLETE WITH TRANSVAGINAL Study Result  Narrative & Impression  CLINICAL DATA:  Abnormal uterine bleeding   EXAM: TRANSABDOMINAL AND TRANSVAGINAL ULTRASOUND OF PELVIS   TECHNIQUE: Both transabdominal and transvaginal ultrasound examinations of the pelvis were performed. Transabdominal technique was performed for global imaging of the pelvis including uterus, ovaries, adnexal regions, and pelvic cul-de-sac. It was necessary to proceed with endovaginal exam following the transabdominal exam to visualize the endometrium.   COMPARISON:  None Available.   FINDINGS: Uterus   Measurements: 8.3 x 4.5 x 5.4 cm = volume: 105.2 mL. No fibroids or other mass visualized.   Endometrium   Thickness: 3.9 mm.  Small amount of fluid within the endometrium.   Right ovary   Measurements: 3.4 x 2.9 x 2.8 cm = volume: 14 mL. Normal appearance/no adnexal mass.   Left ovary   Measurements: 3.1 x 2.3 x 1.4 cm = volume: 5.2 mL. Normal appearance/no adnexal mass.   Other findings   No abnormal free fluid.   IMPRESSION: Endometrium measures 4 mm. If bleeding remains unresponsive to hormonal or medical therapy, sonohysterogram should be considered for focal lesion work-up. (Ref: Radiological Reasoning: Algorithmic Workup of Abnormal Vaginal Bleeding with Endovaginal Sonography and Sonohysterography. AJR 2008; 808:D31-26)     Electronically Signed  By: Bard Moats M.D.   On: 11/09/2023 19:31      Result History  US  PELVIC COMPLETE WITH TRANSVAGINAL (Order #[id number]) on 11/09/2023 - Order Result History Report  MyChart Results Release  MyChart Status: Active  Results Release   Encounter-Level Documents on 11/09/2023:  Electronic signature on 11/09/2023 12:37 PM - E-signed Electronic signature on 11/09/2023 12:36 PM - E-signed      Order-Level  Documents:  There are no order-level documents.   Hospital Account-Level Documents:  There are no hospital account-level documents. Vitals   ago   SURGICAL PATHOLOGY SURGICAL PATHOLOGY CASE: [id number] PATIENT: [name] Surgical Pathology Report     Clinical History: AUB (cm)     FINAL MICROSCOPIC DIAGNOSIS:  A. ENDOMETRIUM, BIOPSY: Inactive endometrial glands with stromal progestational changes. Negative for endometrial intraepithelial neoplasia (EIN) and malignancy.    Assessment & Plan:  Menorrhagia H/o dvt  Dysmenorrhea, felt in back, likely endometriosis H/o abnormal pap smears in the past with cryo Anemia  Desires definitive management  Symptomatic fibroid uterus:  Counseled on all options.  She would like to have the [facility].  Counseled extensively on the procedure including but not limited to what to expect and risks and benefits.  Counseled on postop care and pelvic rest until cleared after the surgery with restricted lifting for 6 weeks after.  Counseled on the benefits of the robotic procedure with faster return to daily activities, improved outcomes, and less risk for complications.  Counseled on option for excision of endometriosis if seen and in a safe location, at the same time and she agreed. She would like to have this scheduled. Counseled on the importance of ovarian preservation She is ready to schedule and testing is up to date. Surgery scheduling placed 20 minutes spent on reviewing records, imaging,  and one on one patient time and counseling patient and documentation Dr. Glennon Almarie MARLA Glennon

## 2024-08-16 ENCOUNTER — Encounter: Payer: Self-pay | Admitting: *Deleted

## 2024-08-24 ENCOUNTER — Encounter (HOSPITAL_COMMUNITY): Payer: Self-pay | Admitting: Obstetrics and Gynecology

## 2024-08-25 ENCOUNTER — Encounter (HOSPITAL_COMMUNITY): Payer: Self-pay | Admitting: Obstetrics and Gynecology

## 2024-08-25 ENCOUNTER — Encounter: Admitting: Obstetrics and Gynecology

## 2024-08-25 NOTE — Pre-Procedure Instructions (Signed)
 Surgical Instructions  Your procedure is scheduled on :   Tuesday,  08-30-2024 Report to [facility] Main Entrance A at 11:15 AM, then check in the Admitting office. Any questions or running late day of surgery :  call [phone]  Questions prior to your surgery day:  call [phone], Monday -- Friday 8am - 4pm. If you experience any cold or flu symptoms such as cough, fever, chills, shortness of breath, etc. between now and you scheduled surgery, please notify your surgeon office.   Remember: Do not eat any food after midnight the night before surgery. You may have clear liquids from midnight night before surgery until 10:15 AM.   Clear liquids allowed are:  Water             Carbonated Beverages (diabetics choose diet or no sugar options)  Clear Tea ( no milk, no honey, etc.)  Black coffee ( NO MILK, CREAM OR POWDERED CREAMER OF ANY KIND)  Sport drinks, like Gatorade (diabetes choose diet or no sugar options)  NO clear liquid after 10:15 AM day of surgery.  This includes No water,  candy,  gum, and mints.  Take these medicines the morning of surgery with A SIPS OF WATER:  NONE   May take these medicines IF NEEDED:  NONE   One week prior to surgery, STOP taking any Aspirin  (unless otherwise instructed by your surgeon) Aleve , Naproxen , ibuprofen , Motrin , Advil , Goody's, BC's, all herbal medications/ supplements, fish oil, and non-prescription vitamins.  Do NOT Smoke (tobacco/ vaping) and Do Not drink alcohol for 24 hours prior to your procedure.  For those patients that use a CPAP.  Please bring your CPAP/ mask/ tubing with them day of surgery . Anesthesia may ask recovery room nurse to use and if you stay the night you be asked to use it.  You will be asked to removed any contacts, glasses, piercing's, hearing aid's, dentures/ partials prior to surgery.  Please bring cases/ container/ solution/ etc., for them day of surgery.   Patients discharged the day of surgery will NOT be  allowed to drive home.  You must have responsible driver and caregiver to stay at home with you the next 24 hours.  SURGICAL WAITING ROOM VISITATION Patients may have no more than 2 support people in the waiting area - if more than 2 , these visitors may rotate.  Pre-op nurse will coordinate an appropriate time for 1 Adult support person, who may not rotate, to accompany patient in pre-op.  Aware some patients may have certain circumstances, speak to pre-op nurse day of surgery.  Children under the age 57 must have an adult with them who is not the patient and must remain in the main waiting area with an adult.  If the patient needs to stay at the hospital during part of their recovery, the visitor guidelines for inpatient rooms apply.  Please refer to the [facility] website for the visitor guidelines for any additional information.  If you received a COVID test during your pre-op visit it is requested that you wear a mask when out in public, stay away from anyone that may not be feeling well and notify your surgeon if you develop symptoms.  If you have been in contact with anyone that has tested positive in the past 10 days notify your surgeon.      - Preparing for Surgery  Before surgery, you can play an important role. Because skin is not sterile, it needs to be as  free of germs as possible. You can reduce the number of germs on your skin by washing with CHG (chlorhexidine gluconate) soap before surgery. CHG is an antiseptic cleaner which kills germs and bonds with the skin to continue killing germs even after washing. Oral hygiene is also important in reducing the risk of infection. Remember to brush your teeth with your regular toothpaste the morning of surgery.  Please DO NOT use if you have an allergy to CHG or antibacterial soaps. If your skin becomes reddened/irritated stop using the CHG and inform your Pre-op nurse day of surgery.  DO NOT shave (including legs and genital  area) for at least 48 hours prior to your CHG shower.   Please follow these instructions carefully:  Shower with CHG soap the night before surgery. If you choose to wash your hair, wash your hair first as usual with your normal shampoo. After you shampoo, rinse your hair and body thoroughly to remove the shampoo. Use CHG as you would any other liquid soap. You can apply CHG directly to the skin and wash gently with a clean washcloth or shower sponge. Apply the CHG soap to your body ONLY FROM THE NECK DOWN. Do not use on open wounds or open sores. Avoid contact with your eyes, ears, mouth, and genitals (private parts). Wash genitals (private parts) with your normal soap. Wash thoroughly, paying special attention to the area where your surgery will be performed. Thoroughly rinse your body with warm water from the neck down. DO NOT shower/wash with your normal soap after using and rinsing off the CHG soap. DO NOT use lotions, oils, etc., after showering with CHG. Pat yourself dry with a clean towel. Wear clean pajamas. Place clean sheets on your bed the night of your CHG shower and do not sleep with pets.  Day of Surgery  DO NOT Apply any lotions,  powder,  oils,  deodorants (may use underarm deodorant),  cologne/  perfumes  or makeup Do Not wear jewelry /  piercing's/  metal/  permanent jewelry must be removed prior to arrival day of surgery. (No plastic piercing) Do Not wear nail polish,  gel polish,  artificial nails, or any other type of covering on natural finger nails (toe nails are okay) Remember to brush your teeth and rinse mouth out. Put on clean / comfortable clothes. [place] is not responsible for valuables/ personal belongings

## 2024-08-25 NOTE — Progress Notes (Addendum)
 Addendum:  Actual weight done at lab appointment today, 225.1 lb  Spoke w/ via phone for pre-op interview--- pt Lab needs dos----      upt   Lab results------ lab appt 08-29-2024 @ 0900 getting CBC/ T&S/ BMP/ EKG COVID test -----patient states asymptomatic no test needed Arrive at ------- 1115 on 08-30-2024 NPO after MN NO Solid Food.  Clear liquids from MN until--- 1015 Pre-Surgery Ensure or G2: n/a  Med rec completed Medications to take morning of surgery ----- none Diabetic medication ----- n/a  GLP1 agonist last dose: n/a GLP1 instructions:  Patient instructed no nail polish to be worn day of surgery Patient instructed to bring photo id and insurance card day of surgery Patient aware to have Driver (ride ) / caregiver    for 24 hours after surgery - mother, tammy howell Patient Special Instructions ----- will pick up soap and written instructions at lap appt Pre-Op special Instructions ----- n/a  Patient verbalized understanding of instructions that were given at this phone interview. Patient denies chest pain, sob, fever, cough at the interview.

## 2024-08-29 ENCOUNTER — Telehealth: Payer: Self-pay | Admitting: Obstetrics and Gynecology

## 2024-08-29 ENCOUNTER — Encounter (HOSPITAL_COMMUNITY)
Admission: RE | Admit: 2024-08-29 | Discharge: 2024-08-29 | Disposition: A | Discharge status: Home / Self Care | Source: Ambulatory Visit | Attending: Obstetrics and Gynecology | Admitting: Obstetrics and Gynecology

## 2024-08-29 ENCOUNTER — Ambulatory Visit: Payer: Self-pay | Admitting: Obstetrics and Gynecology

## 2024-08-29 DIAGNOSIS — M1990 Unspecified osteoarthritis, unspecified site: Secondary | ICD-10-CM | POA: Diagnosis not present

## 2024-08-29 DIAGNOSIS — Z01818 Encounter for other preprocedural examination: Secondary | ICD-10-CM | POA: Insufficient documentation

## 2024-08-29 DIAGNOSIS — N80353 Endometriosis of bilateral pelvic sidewall, unspecified depth: Secondary | ICD-10-CM | POA: Diagnosis not present

## 2024-08-29 DIAGNOSIS — N8311 Corpus luteum cyst of right ovary: Secondary | ICD-10-CM | POA: Diagnosis not present

## 2024-08-29 DIAGNOSIS — Z79899 Other long term (current) drug therapy: Secondary | ICD-10-CM | POA: Diagnosis not present

## 2024-08-29 DIAGNOSIS — D500 Iron deficiency anemia secondary to blood loss (chronic): Secondary | ICD-10-CM | POA: Diagnosis not present

## 2024-08-29 DIAGNOSIS — R7303 Prediabetes: Secondary | ICD-10-CM | POA: Diagnosis not present

## 2024-08-29 DIAGNOSIS — F445 Conversion disorder with seizures or convulsions: Secondary | ICD-10-CM | POA: Diagnosis not present

## 2024-08-29 DIAGNOSIS — N920 Excessive and frequent menstruation with regular cycle: Secondary | ICD-10-CM | POA: Diagnosis present

## 2024-08-29 DIAGNOSIS — N803C2 Endometriosis of the left uterosacral ligament, unspecified depth: Secondary | ICD-10-CM | POA: Diagnosis not present

## 2024-08-29 DIAGNOSIS — N803 Endometriosis of pelvic peritoneum, unspecified: Secondary | ICD-10-CM | POA: Diagnosis not present

## 2024-08-29 DIAGNOSIS — N946 Dysmenorrhea, unspecified: Secondary | ICD-10-CM | POA: Diagnosis present

## 2024-08-29 DIAGNOSIS — N838 Other noninflammatory disorders of ovary, fallopian tube and broad ligament: Secondary | ICD-10-CM | POA: Diagnosis not present

## 2024-08-29 DIAGNOSIS — Z86718 Personal history of other venous thrombosis and embolism: Secondary | ICD-10-CM | POA: Diagnosis not present

## 2024-08-29 DIAGNOSIS — D251 Intramural leiomyoma of uterus: Secondary | ICD-10-CM | POA: Diagnosis not present

## 2024-08-29 DIAGNOSIS — N8003 Adenomyosis of the uterus: Secondary | ICD-10-CM | POA: Diagnosis not present

## 2024-08-29 DIAGNOSIS — Z7901 Long term (current) use of anticoagulants: Secondary | ICD-10-CM | POA: Diagnosis not present

## 2024-08-29 DIAGNOSIS — I10 Essential (primary) hypertension: Secondary | ICD-10-CM | POA: Diagnosis not present

## 2024-08-29 DIAGNOSIS — N888 Other specified noninflammatory disorders of cervix uteri: Secondary | ICD-10-CM | POA: Diagnosis not present

## 2024-08-29 LAB — BASIC METABOLIC PANEL WITH GFR
Anion gap: 10 (ref 5–15)
BUN: 7 mg/dL (ref 6–20)
CO2: 25 mmol/L (ref 22–32)
Calcium: 8.7 mg/dL — ABNORMAL LOW (ref 8.9–10.3)
Chloride: 105 mmol/L (ref 98–111)
Creatinine, Ser: 0.81 mg/dL (ref 0.44–1.00)
GFR, Estimated: >60 mL/min (ref >60)
Glucose, Bld: 104 mg/dL — ABNORMAL HIGH (ref 70–99)
Potassium: 3.7 mmol/L (ref 3.5–5.1)
Sodium: 140 mmol/L (ref 135–145)

## 2024-08-29 LAB — CBC
HCT: 38.5 % (ref 36.0–46.0)
Hemoglobin: 11.8 g/dL — ABNORMAL LOW (ref 12.0–15.0)
MCH: 22.5 pg — ABNORMAL LOW (ref 26.0–34.0)
MCHC: 30.6 g/dL (ref 30.0–36.0)
MCV: 73.5 fL — ABNORMAL LOW (ref 80.0–100.0)
Platelets: UNDETERMINED K/uL (ref 150–400)
RBC: 5.24 MIL/uL — ABNORMAL HIGH (ref 3.87–5.11)
RDW: 14.3 % (ref 11.5–15.5)
WBC: 6.8 K/uL (ref 4.0–10.5)
nRBC: 0 % (ref 0.0–0.2)

## 2024-08-29 LAB — TYPE AND SCREEN
ABO/RH(D): B POS
Antibody Screen: NEGATIVE

## 2024-08-29 NOTE — Telephone Encounter (Signed)
 Patient and is ready for surgery. She requesting 2 weeks off for her mother to help her daughter and grandkids while she recovers. I am fine with this request and will forward on to [facility] Dr. Glennon

## 2024-08-29 NOTE — Telephone Encounter (Signed)
 See surgery referral.   Encounter closed.

## 2024-08-30 ENCOUNTER — Ambulatory Visit (HOSPITAL_COMMUNITY): Payer: Self-pay | Admitting: Anesthesiology

## 2024-08-30 ENCOUNTER — Ambulatory Visit (HOSPITAL_COMMUNITY)
Admission: RE | Admit: 2024-08-30 | Discharge: 2024-08-30 | Disposition: A | Discharge status: Home / Self Care | Attending: Obstetrics and Gynecology | Admitting: Obstetrics and Gynecology

## 2024-08-30 ENCOUNTER — Encounter (HOSPITAL_COMMUNITY): Payer: Self-pay | Admitting: Obstetrics and Gynecology

## 2024-08-30 ENCOUNTER — Other Ambulatory Visit: Payer: Self-pay

## 2024-08-30 ENCOUNTER — Encounter (HOSPITAL_COMMUNITY)
Admission: RE | Disposition: A | Discharge status: Home / Self Care | Payer: Self-pay | Source: Home / Self Care | Attending: Obstetrics and Gynecology

## 2024-08-30 DIAGNOSIS — N888 Other specified noninflammatory disorders of cervix uteri: Secondary | ICD-10-CM | POA: Insufficient documentation

## 2024-08-30 DIAGNOSIS — D500 Iron deficiency anemia secondary to blood loss (chronic): Secondary | ICD-10-CM | POA: Insufficient documentation

## 2024-08-30 DIAGNOSIS — N920 Excessive and frequent menstruation with regular cycle: Secondary | ICD-10-CM | POA: Diagnosis not present

## 2024-08-30 DIAGNOSIS — Z01818 Encounter for other preprocedural examination: Secondary | ICD-10-CM

## 2024-08-30 DIAGNOSIS — D251 Intramural leiomyoma of uterus: Secondary | ICD-10-CM | POA: Insufficient documentation

## 2024-08-30 DIAGNOSIS — F445 Conversion disorder with seizures or convulsions: Secondary | ICD-10-CM | POA: Insufficient documentation

## 2024-08-30 DIAGNOSIS — N803C2 Endometriosis of the left uterosacral ligament, unspecified depth: Secondary | ICD-10-CM | POA: Insufficient documentation

## 2024-08-30 DIAGNOSIS — Z86718 Personal history of other venous thrombosis and embolism: Secondary | ICD-10-CM | POA: Insufficient documentation

## 2024-08-30 DIAGNOSIS — N80353 Endometriosis of bilateral pelvic sidewall, unspecified depth: Secondary | ICD-10-CM | POA: Insufficient documentation

## 2024-08-30 DIAGNOSIS — N809 Endometriosis, unspecified: Secondary | ICD-10-CM | POA: Diagnosis not present

## 2024-08-30 DIAGNOSIS — N8003 Adenomyosis of the uterus: Secondary | ICD-10-CM | POA: Insufficient documentation

## 2024-08-30 DIAGNOSIS — Z7901 Long term (current) use of anticoagulants: Secondary | ICD-10-CM | POA: Insufficient documentation

## 2024-08-30 DIAGNOSIS — N838 Other noninflammatory disorders of ovary, fallopian tube and broad ligament: Secondary | ICD-10-CM | POA: Insufficient documentation

## 2024-08-30 DIAGNOSIS — N8311 Corpus luteum cyst of right ovary: Secondary | ICD-10-CM | POA: Insufficient documentation

## 2024-08-30 DIAGNOSIS — N803 Endometriosis of pelvic peritoneum, unspecified: Secondary | ICD-10-CM | POA: Insufficient documentation

## 2024-08-30 DIAGNOSIS — N946 Dysmenorrhea, unspecified: Secondary | ICD-10-CM | POA: Diagnosis not present

## 2024-08-30 DIAGNOSIS — I10 Essential (primary) hypertension: Secondary | ICD-10-CM | POA: Insufficient documentation

## 2024-08-30 DIAGNOSIS — R7303 Prediabetes: Secondary | ICD-10-CM | POA: Insufficient documentation

## 2024-08-30 DIAGNOSIS — M1990 Unspecified osteoarthritis, unspecified site: Secondary | ICD-10-CM | POA: Insufficient documentation

## 2024-08-30 DIAGNOSIS — Z79899 Other long term (current) drug therapy: Secondary | ICD-10-CM | POA: Insufficient documentation

## 2024-08-30 HISTORY — DX: Migraine, unspecified, not intractable, without status migrainosus: G43.909

## 2024-08-30 HISTORY — PX: PROCTOSCOPY: SHX2266

## 2024-08-30 HISTORY — DX: Presence of spectacles and contact lenses: Z97.3

## 2024-08-30 HISTORY — DX: Excessive and frequent menstruation with irregular cycle: N92.1

## 2024-08-30 HISTORY — DX: Dysmenorrhea, unspecified: N94.6

## 2024-08-30 HISTORY — PX: CYSTOSCOPY: SHX5120

## 2024-08-30 HISTORY — DX: Personal history of other diseases of the female genital tract: Z87.42

## 2024-08-30 HISTORY — PX: HYSTERECTOMY, TOTAL, LAPAROSCOPIC, ROBOT-ASSISTED WITH SALPINGECTOMY: SHX7587

## 2024-08-30 HISTORY — PX: EXCISION, ENDOMETRIOSIS, ROBOTIC ASSISTED, LAPAROSCOPIC: SHX7564

## 2024-08-30 LAB — POCT PREGNANCY, URINE: Preg Test, Ur: NEGATIVE

## 2024-08-30 SURGERY — HYSTERECTOMY, TOTAL, LAPAROSCOPIC, ROBOT-ASSISTED WITH SALPINGECTOMY
Anesthesia: General | Site: Rectum

## 2024-08-30 MED ORDER — AMISULPRIDE (ANTIEMETIC) 5 MG/2ML IV SOLN: 10.0000 mg | Freq: Once | INTRAVENOUS | Status: DC | PRN

## 2024-08-30 MED ORDER — HEMOSTATIC AGENTS (NO CHARGE) OPTIME
TOPICAL | Status: DC | PRN
Start: 2024-08-30 — End: 2024-08-30
  Administered 2024-08-30: 1 via TOPICAL

## 2024-08-30 MED ORDER — DOCUSATE SODIUM 100 MG PO CAPS: 100.0000 mg | ORAL_CAPSULE | Freq: Every day | ORAL | 0 refills | Status: DC

## 2024-08-30 MED ORDER — ROCURONIUM BROMIDE 10 MG/ML (PF) SYRINGE
PREFILLED_SYRINGE | INTRAVENOUS | Status: DC | PRN
Start: 2024-08-30 — End: 2024-08-30
  Administered 2024-08-30: 20 mg via INTRAVENOUS
  Administered 2024-08-30: 70 mg via INTRAVENOUS
  Administered 2024-08-30 (×2): 10 mg via INTRAVENOUS

## 2024-08-30 MED ORDER — SODIUM CHLORIDE 0.9 % IV SOLN
Freq: Once | INTRAVENOUS | Status: AC
  Filled 2024-08-30: qty 10

## 2024-08-30 MED ORDER — ONDANSETRON HCL 4 MG/2ML IJ SOLN
INTRAMUSCULAR | Status: AC
  Filled 2024-08-30: qty 2

## 2024-08-30 MED ORDER — HYDROMORPHONE HCL 1 MG/ML IJ SOLN
INTRAMUSCULAR | Status: DC | PRN
  Administered 2024-08-30: .5 mg via INTRAVENOUS

## 2024-08-30 MED ORDER — PHENYLEPHRINE 80 MCG/ML (10ML) SYRINGE FOR IV PUSH (FOR BLOOD PRESSURE SUPPORT)
PREFILLED_SYRINGE | INTRAVENOUS | Status: DC | PRN
  Administered 2024-08-30 (×2): 80 ug via INTRAVENOUS

## 2024-08-30 MED ORDER — HYDROMORPHONE HCL 1 MG/ML IJ SOLN
INTRAMUSCULAR | Status: AC
  Filled 2024-08-30: qty 1

## 2024-08-30 MED ORDER — SODIUM CHLORIDE 0.9 % IV SOLN
2.0000 g | INTRAVENOUS | Status: AC
  Administered 2024-08-30: 2 g via INTRAVENOUS

## 2024-08-30 MED ORDER — LIDOCAINE 2% (20 MG/ML) 5 ML SYRINGE
INTRAMUSCULAR | Status: DC | PRN
  Administered 2024-08-30: 100 mg via INTRAVENOUS

## 2024-08-30 MED ORDER — OXYCODONE HCL 5 MG PO TABS
5.0000 mg | ORAL_TABLET | Freq: Once | ORAL | Status: AC | PRN
  Administered 2024-08-30: 5 mg via ORAL

## 2024-08-30 MED ORDER — FENTANYL CITRATE (PF) 100 MCG/2ML IJ SOLN
INTRAMUSCULAR | Status: AC
  Filled 2024-08-30: qty 2

## 2024-08-30 MED ORDER — CHLORHEXIDINE GLUCONATE 0.12 % MT SOLN: 15.0000 mL | Freq: Once | OROMUCOSAL | Status: AC

## 2024-08-30 MED ORDER — HEPARIN SODIUM (PORCINE) 5000 UNIT/ML IJ SOLN: 5000.0000 [IU] | Freq: Once | INTRAMUSCULAR | Status: DC

## 2024-08-30 MED ORDER — LACTATED RINGERS IV SOLN: INTRAVENOUS | Status: DC

## 2024-08-30 MED ORDER — POVIDONE-IODINE 10 % EX SWAB: 2.0000 | Freq: Once | CUTANEOUS | Status: DC

## 2024-08-30 MED ORDER — ORAL CARE MOUTH RINSE
15.0000 mL | Freq: Once | OROMUCOSAL | Status: AC
Start: 2024-08-30 — End: 2024-08-30

## 2024-08-30 MED ORDER — OXYCODONE HCL 5 MG PO TABS
ORAL_TABLET | ORAL | Status: AC
  Filled 2024-08-30: qty 1

## 2024-08-30 MED ORDER — ROCURONIUM BROMIDE 10 MG/ML (PF) SYRINGE
PREFILLED_SYRINGE | INTRAVENOUS | Status: AC
  Filled 2024-08-30: qty 10

## 2024-08-30 MED ORDER — METHYLENE BLUE 20 MG/2ML IV SOSY
PREFILLED_SYRINGE | INTRAVENOUS | Status: AC
Start: 2024-08-30 — End: 2024-08-30
  Filled 2024-08-30: qty 2

## 2024-08-30 MED ORDER — PHENYLEPHRINE 80 MCG/ML (10ML) SYRINGE FOR IV PUSH (FOR BLOOD PRESSURE SUPPORT)
PREFILLED_SYRINGE | INTRAVENOUS | Status: AC
Start: 2024-08-30 — End: 2024-08-30
  Filled 2024-08-30: qty 10

## 2024-08-30 MED ORDER — ONDANSETRON HCL 4 MG/2ML IJ SOLN
INTRAMUSCULAR | Status: DC | PRN
  Administered 2024-08-30: 4 mg via INTRAVENOUS

## 2024-08-30 MED ORDER — POLYETHYLENE GLYCOL 3350 17 GM/SCOOP PO POWD: 17.0000 g | Freq: Every day | ORAL | 0 refills | Status: AC

## 2024-08-30 MED ORDER — PROPOFOL 10 MG/ML IV BOLUS
INTRAVENOUS | Status: AC
  Filled 2024-08-30: qty 20

## 2024-08-30 MED ORDER — SUGAMMADEX SODIUM 200 MG/2ML IV SOLN
INTRAVENOUS | Status: DC | PRN
  Administered 2024-08-30: 200 mg via INTRAVENOUS

## 2024-08-30 MED ORDER — PHENYLEPHRINE 80 MCG/ML (10ML) SYRINGE FOR IV PUSH (FOR BLOOD PRESSURE SUPPORT)
PREFILLED_SYRINGE | INTRAVENOUS | Status: AC
  Filled 2024-08-30: qty 10

## 2024-08-30 MED ORDER — OXYCODONE HCL 5 MG/5ML PO SOLN: 5.0000 mg | Freq: Once | ORAL | Status: AC | PRN

## 2024-08-30 MED ORDER — BUPIVACAINE HCL (PF) 0.5 % IJ SOLN
INTRAMUSCULAR | Status: AC
  Filled 2024-08-30: qty 30

## 2024-08-30 MED ORDER — HYDROMORPHONE HCL 1 MG/ML IJ SOLN
INTRAMUSCULAR | Status: AC
  Filled 2024-08-30: qty 0.5

## 2024-08-30 MED ORDER — LIDOCAINE 2% (20 MG/ML) 5 ML SYRINGE
INTRAMUSCULAR | Status: AC
  Filled 2024-08-30: qty 5

## 2024-08-30 MED ORDER — METRONIDAZOLE 500 MG/100ML IV SOLN
INTRAVENOUS | Status: AC
  Filled 2024-08-30: qty 100

## 2024-08-30 MED ORDER — METRONIDAZOLE 500 MG/100ML IV SOLN
500.0000 mg | Freq: Once | INTRAVENOUS | Status: AC
  Administered 2024-08-30: 500 mg via INTRAVENOUS

## 2024-08-30 MED ORDER — SODIUM CHLORIDE 0.9 % IR SOLN
Status: DC | PRN
  Administered 2024-08-30: 1000 mL

## 2024-08-30 MED ORDER — 0.9 % SODIUM CHLORIDE (POUR BTL) OPTIME
TOPICAL | Status: DC | PRN
Start: 2024-08-30 — End: 2024-08-30
  Administered 2024-08-30: 1000 mL

## 2024-08-30 MED ORDER — SCOPOLAMINE 1 MG/3DAYS TD PT72
MEDICATED_PATCH | TRANSDERMAL | Status: AC
  Administered 2024-08-30: 1 mg via TRANSDERMAL
  Filled 2024-08-30: qty 1

## 2024-08-30 MED ORDER — PROPOFOL 10 MG/ML IV BOLUS
INTRAVENOUS | Status: AC
Start: 2024-08-30 — End: 2024-08-30
  Filled 2024-08-30: qty 20

## 2024-08-30 MED ORDER — FENTANYL CITRATE (PF) 250 MCG/5ML IJ SOLN
INTRAMUSCULAR | Status: DC | PRN
Start: 2024-08-30 — End: 2024-08-30
  Administered 2024-08-30: 50 ug via INTRAVENOUS
  Administered 2024-08-30: 100 ug via INTRAVENOUS
  Administered 2024-08-30: 50 ug via INTRAVENOUS

## 2024-08-30 MED ORDER — SCOPOLAMINE 1 MG/3DAYS TD PT72: 1.0000 | MEDICATED_PATCH | TRANSDERMAL | Status: DC

## 2024-08-30 MED ORDER — SODIUM CHLORIDE (PF) 0.9 % IJ SOLN
INTRAMUSCULAR | Status: AC
Start: 2024-08-30 — End: 2024-08-30
  Filled 2024-08-30: qty 10

## 2024-08-30 MED ORDER — ACETAMINOPHEN 500 MG PO TABS
ORAL_TABLET | ORAL | Status: AC
  Administered 2024-08-30: 1000 mg via ORAL
  Filled 2024-08-30: qty 1

## 2024-08-30 MED ORDER — DEXAMETHASONE SOD PHOSPHATE PF 10 MG/ML IJ SOLN
INTRAMUSCULAR | Status: DC | PRN
  Administered 2024-08-30: 10 mg via INTRAVENOUS

## 2024-08-30 MED ORDER — MIDAZOLAM HCL 2 MG/2ML IJ SOLN
INTRAMUSCULAR | Status: AC
  Filled 2024-08-30: qty 2

## 2024-08-30 MED ORDER — HYDROMORPHONE HCL 1 MG/ML IJ SOLN
0.2500 mg | INTRAMUSCULAR | Status: DC | PRN
  Administered 2024-08-30 (×2): 0.5 mg via INTRAVENOUS
  Administered 2024-08-30 (×2): 0.25 mg via INTRAVENOUS

## 2024-08-30 MED ORDER — PROPOFOL 10 MG/ML IV BOLUS
INTRAVENOUS | Status: DC | PRN
  Administered 2024-08-30 (×3): 10 mg via INTRAVENOUS
  Administered 2024-08-30: 200 mg via INTRAVENOUS

## 2024-08-30 MED ORDER — ACETAMINOPHEN 500 MG PO TABS: 1000.0000 mg | ORAL_TABLET | ORAL | Status: AC

## 2024-08-30 MED ORDER — MIDAZOLAM HCL (PF) 2 MG/2ML IJ SOLN
INTRAMUSCULAR | Status: DC | PRN
  Administered 2024-08-30: 2 mg via INTRAVENOUS

## 2024-08-30 MED ORDER — FLUORESCEIN SODIUM 10 % IV SOLN
INTRAVENOUS | Status: AC
Start: 2024-08-30 — End: 2024-08-30
  Filled 2024-08-30: qty 5

## 2024-08-30 MED ORDER — BUPIVACAINE HCL (PF) 0.5 % IJ SOLN
INTRAMUSCULAR | Status: DC | PRN
  Administered 2024-08-30: 20 mL

## 2024-08-30 MED ORDER — CHLORHEXIDINE GLUCONATE 0.12 % MT SOLN
OROMUCOSAL | Status: AC
  Administered 2024-08-30: 15 mL via OROMUCOSAL
  Filled 2024-08-30: qty 15

## 2024-08-30 MED ORDER — SODIUM CHLORIDE 0.9 % IV SOLN
INTRAVENOUS | Status: AC
  Filled 2024-08-30: qty 2

## 2024-08-30 SURGICAL SUPPLY — 47 items
APPLICATOR ARISTA FLEXITIP XL (MISCELLANEOUS) IMPLANT
COVER BACK TABLE 60X90IN (DRAPES) ×4 IMPLANT
COVER TIP SHEARS 8 DVNC (MISCELLANEOUS) ×4 IMPLANT
DEFOGGER SCOPE WARM SEASHARP (MISCELLANEOUS) ×4 IMPLANT
DERMABOND ADVANCED .7 DNX12 (GAUZE/BANDAGES/DRESSINGS) ×4 IMPLANT
DRAPE ARM DVNC X/XI (DISPOSABLE) ×16 IMPLANT
DRAPE COLUMN DVNC XI (DISPOSABLE) ×4 IMPLANT
DRAPE SURG IRRIG POUCH 19X23 (DRAPES) ×4 IMPLANT
DRAPE UTILITY XL STRL (DRAPES) ×4 IMPLANT
DRIVER NDL MEGA SUTCUT DVNCXI (INSTRUMENTS) ×4 IMPLANT
DRIVER NDLE MEGA SUTCUT DVNCXI (INSTRUMENTS) ×4 IMPLANT
DURAPREP 26ML APPLICATOR (WOUND CARE) ×4 IMPLANT
ELECTRODE REM PT RTRN 9FT ADLT (ELECTROSURGICAL) ×4 IMPLANT
FORCEPS PROGRASP DVNC XI (FORCEP) ×4 IMPLANT
GAUZE 4X4 16PLY ~~LOC~~+RFID DBL (SPONGE) IMPLANT
GLOVE BIOGEL PI IND STRL 7.0 (GLOVE) ×8 IMPLANT
GLOVE NEODERM STER SZ 7 (GLOVE) ×12 IMPLANT
GLOVE SURG SS PI 7.0 STRL IVOR (GLOVE) IMPLANT
GOWN STRL REUS W/ TWL LRG LVL3 (GOWN DISPOSABLE) ×4 IMPLANT
GOWN STRL SURGICAL XL XLNG (GOWN DISPOSABLE) IMPLANT
HIBICLENS CHG 4% 4OZ (MISCELLANEOUS) IMPLANT
IRRIGATION SUCT STRKRFLW 2 WTP (MISCELLANEOUS) ×4 IMPLANT
KIT PINK PAD W/HEAD ARM REST (MISCELLANEOUS) ×4 IMPLANT
KIT SIGMOIDOSCOPE (SET/KITS/TRAYS/PACK) IMPLANT
KIT TURNOVER KIT B (KITS) ×4 IMPLANT
LEGGING LITHOTOMY PAIR STRL (DRAPES) ×4 IMPLANT
MANIFOLD NEPTUNE II (INSTRUMENTS) ×4 IMPLANT
OBTURATOR OPTICALSTD 8 DVNC (TROCAR) ×4 IMPLANT
PACK ROBOT WH (CUSTOM PROCEDURE TRAY) ×4 IMPLANT
PACK ROBOTIC GOWN (GOWN DISPOSABLE) ×4 IMPLANT
PAD OB MATERNITY 11 LF (PERSONAL CARE ITEMS) ×4 IMPLANT
RUMI II GYRUS 3.5CM BLUE (DISPOSABLE) IMPLANT
SCISSORS MNPLR CVD DVNC XI (INSTRUMENTS) ×4 IMPLANT
SEAL UNIV 5-12 XI (MISCELLANEOUS) ×12 IMPLANT
SEALER VESSEL EXT DVNC XI (MISCELLANEOUS) IMPLANT
SET CYSTO IRRIGATION (SET/KITS/TRAYS/PACK) ×4 IMPLANT
SET TUBE SMOKE EVAC HIGH FLOW (TUBING) ×4 IMPLANT
SOLN 0.9% NACL POUR BTL 1000ML (IV SOLUTION) ×4 IMPLANT
SPIKE FLUID TRANSFER (MISCELLANEOUS) ×4 IMPLANT
SUT MNCRL AB 4-0 PS2 18 (SUTURE) ×4 IMPLANT
SUT SILK 2 0 SH (SUTURE) IMPLANT
SUT SILK 3 0 SH 30 (SUTURE) IMPLANT
SUT VLOC 180 0 9IN GS21 (SUTURE) ×8 IMPLANT
TIP UTERINE 6.7X10CM GRN DISP (MISCELLANEOUS) IMPLANT
TOWEL GREEN STERILE (TOWEL DISPOSABLE) ×4 IMPLANT
TRAY FOLEY W/BAG SLVR 14FR (SET/KITS/TRAYS/PACK) ×4 IMPLANT
UNDERPAD 30X36 HEAVY ABSORB (UNDERPADS AND DIAPERS) ×4 IMPLANT

## 2024-08-30 NOTE — Progress Notes (Signed)
 Patient ID: [name], female   DOB: 10-12-1991, 33 y.o.   MRN: [id number] Intraoperative consult from Dr Glennon undergoing robotic hysterectomy.  There is linear serosal tear in rectum.  She repaired this while I observed with silk suture.   The repair looked good.  I then did a proctoscopy at completion. There was no leakage of air and I visualized the rectal mucosa throughout the length of the serosal tear and it was intact. Would do clears tonight, ensure bowel regimen.

## 2024-08-30 NOTE — Transfer of Care (Signed)
 Immediate Anesthesia Transfer of Care Note  Patient: [name]  Procedure(s) Performed: HYSTERECTOMY, TOTAL, LAPAROSCOPIC, ROBOT-ASSISTED WITH SALPINGECTOMY (Bilateral: Pelvis) CYSTOSCOPY (Bladder) EXCISION, ENDOMETRIOSIS, ROBOTIC ASSISTED, LAPAROSCOPIC (Pelvis) PROCTOSCOPY with LARGE BOWEL SEROSAL REPAIR (Rectum)  Patient Location: PACU  Anesthesia Type:General  Level of Consciousness: awake and alert   Airway & Oxygen Therapy: Patient Spontanous Breathing and Patient connected to nasal cannula oxygen  Post-op Assessment: Report given to RN and Post -op Vital signs reviewed and stable  Post vital signs: Reviewed and stable  Last Vitals:  Vitals Value Taken Time  BP 128/71 08/30/24 16:03  Temp    Pulse 106 08/30/24 16:04  Resp 15 08/30/24 16:04  SpO2 100 % 08/30/24 16:04  Vitals shown include unfiled device data.  Last Pain:  Vitals:   08/30/24 1146  TempSrc: Oral  PainSc: 0-No pain      Patients Stated Pain Goal: 5 (08/30/24 1146)  Complications: No notable events documented.

## 2024-08-30 NOTE — Op Note (Signed)
 08/30/2024  [id number] Leonette LULLA [place]        OPERATIVE REPORT   Preop Diagnosis: menorrhagia, dysmenorrhea, anemia Procedure: robotic hysterectomy, bilateral salpingectomy, excision of endometriosis lesions, large bowel serosa repair with intra-op consult with Dr. Jerre, cystoscopy   Surgeon: Dr. Almarie Rollo Carpen Assistant:  Circulator: [place], Hadassah Buel RAMAN, RN Scrub Person: Morgan Darnel, CST; Rubin Hadassah HERO RN First Assistant: Starla Duwaine BROCKS, RN    Fluids: please see anesthesia report   Complications: large bowel serosal repair with normal air test and proctoscope after Anesthesia: General     Findings:  boggy contour 10cm uterus, normal ovaries and tubes, extensive endometriosis on the anterior and posterior peritoneum, uterus, bilateral ovarian fossa, left uterosacral ligament and left side wall and left ovary. Right ovary with large corpus luteum cyst on the periphery of the ovary. Normal appendix. Stage 3 endometriosis overall. Cystoscopy at the end of the case with normal bladder and patent ureters bilaterally.   Estimated blood loss: Minimal 25 cc   Specimens: Uterus, cervix and bilateral tubes, several endometriosis specimen   Disposition of specimen: Pathology           Patient is taken to the operating room. She is placed in the supine position. She is a running IV in place. Informed consent was present on the chart. SCDs on her lower extremities and functioning properly. Patient was positioned while she was awake.  Her legs were placed in the low lithotomy position in [place] stirrups. Her arms were tucked by the side.  General endotracheal anesthesia was administered by the anesthesia staff without difficulty.       Dura prep was then used to prep the abdomen and Hibiclens was used to prep the inner thighs, perineum and vagina. Once 3 minutes had past the patient was draped in a normal standard fashion. A proper time out was performed and  everyone agreed.  The legs were lifted to the high lithotomy position. A bivalve speculum was inserted into the vagina and the anterior lip of the cervix was grasped with single-tooth tenaculum.  The uterus sounded to 10 cm. Pratt dilators were used to dilate the cervix.  The RUMI uterine manipulator was obtained inserted into the endometrial cavity and the bulb of the disposable tip was inflated with 8 cc of normal saline. There was a good fit of the KOH ring around the cervix. The tenaculum and bivavle speculum was removed. There is also good manipulation of the uterus.  A Foley catheter was placed to straight drain.  Clear urine was noted. Legs were lowered to the low lithotomy position and attention was turned the abdomen.   Superior to the umbilicus, marcaine  0.25% used to anesthetize the skin.  Using #11 blade, 8mm skin incision was made.  The 8mm robotic trocar and sleeve was inserted under direct visualization.  CO2 gas was  started and patient was placed in trendelenburg position.  Two additional 8mm ports were placed under direct visualization in the left and right lower quadrant.     Ureters were identifies.  Attention was turned to excision of endometriosis lesions. Please see pathology report. Posterior peritoneum removed with extensive endometriosis seen across both sides and retracting the large bowel.  Inadvertent serosal tear noted and repaired with interrupted 3.0 silk on a taper need.  Approximately 3 stitches placed. Dr. Jerre with general surgery present and performed air test and visual proctoscope. This was done after the repair and showed no air and no inner mucosal involvement.  The left tube was elevated and the mesosalpinx was desiccated with the vessel sealer.  The left uterine ovarian pedicle was serially clamped cauterized and incised. Left round ligament was serially clamped cauterized and incised. The anterior and posterior peritoneum of the inferior leaf of the broad  ligament were opened. The beginning of the bladder flap was created.  The bladder was taken down below the level of the KOH ring. The left uterine artery skeletonized and then just superior to the KOH ring this vessel was serially clamped, cauterized, and incised.   Attention was turned the right side.  The uterus was placed on stretch to the opposite side.    The mesosalpinx was incised freeing the tube. Then the right uterine ovarian pedicle was serially clamped cauterized and incised. Next the right round ligament was serially clamped cauterized and incised. The anterior posterior peritoneum of the inferiorly for the broad ligament were opened. The anterior peritoneum was carried across to the dissection on the left side. The remainder of the bladder flap was created using sharp dissection. The bladder was well below the level of the KOH ring. The right uterine artery skeletonized. Then the right uterine artery, above the level of the KOH ring, was serially clamped cauterized and incised. The uterus was devascularized at this point.   The colpotomy was performed.  This was carried around a circumferential fashion until the vaginal mucosa was completely incised in the specimen was freed.  The specimen was then delivered to the vagina intact.  A vaginal occlusive device was used to maintain the pneumoperitoneum   Instruments were changed with a needle driver and prograsp.  Using a 9 inch  zero V-lock suture, the cuff was closed by incorporating the anterior and posterior vaginal mucosa in each stitch. This was carried across all the way to the left corner and a running fashion. Two stitches were brought back towards the midline and the suture was cut flush with the vagina. The needle was brought out the pelvis. The pelvis was irrigated. All pedicles were inspected. No bleeding was noted.   Co2 pressures were lowered to 8mm Hg.  Again, no bleeding was noted.  Ureters were noted deep in the pelvis to be  peristalsing.  At this point the procedure was completed.  The remaining instruments were removed.  The ports were removed under direct visualization of the laparoscope and the pneumoperitoneum was relieved.   The skin was then closed with subcuticular stitches of 3-0 Vicryl. The skin was cleansed Dermabond was applied. Attention was then turned the vagina and the cuff was inspected. No bleeding was noted.  The Foley catheter was removed.  Cystoscopy was performed.  No sutures or bladder injuries were noted.  Ureters were noted with normal urine jets from each one was seen.  Foley was left out after the cystoscopic fluid was drained and cystoscope removed.  Sponge, lap, needle, instrument counts were correct x2. Patient tolerated the procedure very well. She was awakened from anesthesia, extubated and taken to recovery in stable condition.      Dr. Glennon

## 2024-08-30 NOTE — Anesthesia Procedure Notes (Signed)
 Procedure Name: Intubation Date/Time: 08/30/2024 1:12 PM  Performed by: Soren Pigman C, CRNAPre-anesthesia Checklist: Patient identified, Emergency Drugs available, Suction available and Patient being monitored Patient Re-evaluated:Patient Re-evaluated prior to induction Oxygen Delivery Method: Circle system utilized Preoxygenation: Pre-oxygenation with 100% oxygen Induction Type: IV induction Ventilation: Mask ventilation without difficulty Laryngoscope Size: Mac and 3 Grade View: Grade I Tube type: Oral Tube size: 7.0 mm Number of attempts: 1 Airway Equipment and Method: Stylet and Oral airway Placement Confirmation: ETT inserted through vocal cords under direct vision, positive ETCO2 and breath sounds checked- equal and bilateral Secured at: 21 cm Tube secured with: Tape Dental Injury: Teeth and Oropharynx as per pre-operative assessment

## 2024-08-30 NOTE — Anesthesia Preprocedure Evaluation (Addendum)
 Anesthesia Evaluation  Patient identified by MRN, date of birth, ID band Patient awake    Reviewed: Allergy & Precautions, NPO status , Patient's Chart, lab work & pertinent test results  History of Anesthesia Complications Negative for: history of anesthetic complications  Airway Mallampati: II  TM Distance: >3 FB Neck ROM: Full   Comment: Previous grade I view with MAC 3, easy mask Dental  (+) Dental Advisory Given   Pulmonary neg pulmonary ROS   Pulmonary exam normal breath sounds clear to auscultation       Cardiovascular hypertension (olemsartan), Pt. 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 ago),  PSYCHIATRIC DISORDERS (conversion disorder)         GI/Hepatic negative GI ROS, Neg liver ROS,,,  Endo/Other  Pre-diabetes  Renal/GU negative Renal ROS     Musculoskeletal  (+) Arthritis ,    Abdominal  (+) + obese  Peds  Hematology  (+) Blood dyscrasia, anemia Lab Results      Component                Value               Date                      WBC                      6.8                 08/29/2024                HGB                      11.8 (L)            08/29/2024                HCT                      38.5                08/29/2024                MCV                      73.5 (L)            08/29/2024                PLT                                          08/29/2024            PLATELET CLUMPS NOTED ON SMEAR, UNABLE TO ESTIMATE    Anesthesia Other Findings   Reproductive/Obstetrics Endometriosis, menorrhagia                               Anesthesia Physical Anesthesia Plan  ASA: 2  Anesthesia Plan: General   Post-op Pain Management: Tylenol  PO (pre-op)*   Induction:  Intravenous  PONV Risk Score and Plan:  3 and Ondansetron , Dexamethasone , Midazolam , Scopolamine patch - Pre-op and Treatment may vary due to age or medical condition  Airway Management Planned: Oral ETT  Additional Equipment:   Intra-op Plan:   Post-operative Plan: Extubation in OR  Informed Consent: I have reviewed the patients History and Physical, chart, labs and discussed the procedure including the risks, benefits and alternatives for the proposed anesthesia with the patient or authorized representative who has indicated his/her understanding and acceptance.     Dental advisory given  Plan Discussed with: CRNA and Anesthesiologist  Anesthesia Plan Comments: (Risks of general anesthesia discussed including, but not limited to, sore throat, hoarse voice, chipped/damaged teeth, injury to vocal cords, nausea and vomiting, allergic reactions, lung infection, heart attack, stroke, and death. All questions answered. )         Anesthesia Quick Evaluation

## 2024-08-30 NOTE — Discharge Instructions (Signed)
 Robotic Laparoscopic Hysterectomy, Care After  The following information offers guidance on how to care for yourself after your procedure. Your health care provider may also give you more specific instructions. If you have problems or questions, contact your health care provider. What can I expect after the procedure? After the procedure, it is common to have: Pain, bruising, and numbness around your incisions. Tiredness (fatigue).  Abdominal bloating Poor appetite. Chest discomfort that radiates to your shoulder from the carbon dioxide gas for a few days after  Vaginal discharge or spotting. You will need to use a sanitary pad after this procedure.  HEAVY BLEEDING LIKE A PERIOD IS NOT NORMAL.  PLEASE CALL YOUR PROVIDER IF SOAKING A PAD or have copious discharge and or pain.  Feelings of sadness or other emotions.  If your ovaries were also removed, it is also common to have symptoms of menopause, such as hot flashes, night sweats, and lack of sleep (insomnia).  Ovaries should stay in if at all possible until at least the age of 71. Follow these instructions at home: Medicines Take over-the-counter and prescription medicines only as told by your health care provider. Ask your health care provider if the medicine prescribed to you: Requires you to avoid driving or using machinery. You cannot drive for 24 hours after anesthesia Can cause constipation. You may need to take these actions to prevent or treat constipation: Drink enough fluid to keep your urine pale yellow. Take over-the-counter or prescription medicines. Eat foods that are high in fiber, such as beans, whole grains, and fresh fruits and vegetables. Limit foods that are high in fat and processed sugars, such as fried or sweet foods.  Also, avoid spicy foods.  NAUSEA IS COMMON THE FIRST NIGHT OF SURGERY.  IF IT LASTS BEYOND 24 HOURS, CALL YOUR PROVIDER.  NAUSEA MEDICATION WAS GIVEN AT YOUR PREOP APPOINTMENT THAT YOU CAN TAKE  AFTER SURGERY. Incision care  Follow instructions from your health care provider about how to take care of your incisions. Make sure you: LEAVE INCISION OPEN AND DRY-NO BANDAGES Leave stitches (sutures), skin glue, or adhesive strips in place UNTIL 2 WEEKS THEN REMOVE IN THE SHOWER.  If adhesive strip edges start to loosen and curl up, you may trim the loose edges. Check your incision areas every day for signs of infection. Check for: More redness, swelling, or pain. Fluid or blood. Warmth. Pus or a bad smell. Activity  Rest as told by your health care provider. Avoid sitting for a long time without moving. Get up to take short walks every 1-2 hours. This is important to improve blood flow and breathing. Ask for help if you feel weak or unsteady.  If you are sore or tired, rest. Return to your normal activities as told by your health care provider. Ask your health care provider what activities are safe for you. Do not lift, push or pull anything that is heavier than 13 lb (4.5 kg), or the limit that you are told, for 6 WEEKS after surgery or until your health care provider says that it is safe. If you were given a sedative during the procedure, it can affect you for several hours. Do not drive or operate machinery until your health care provider says that it is safe. Lifestyle Do not use any products that contain nicotine or tobacco. These products include cigarettes, chewing tobacco, and vaping devices, such as e-cigarettes. These can delay healing after surgery. If you need help quitting, ask your health care provider.  Do not drink alcohol until your health care provider approves. Take a daily multivitamin and keep a high protein diet for wound healing  DO NOT HAVE INTERCOURSE UNTIL YOU ARE INSTRUCTED THAT IT IS SAFE TO DO SO  Post operative appointments need to be scheduled at 2, 6 and 10 weeks.  You can come anytime before these with any concerns.   Discussed and reviewed with patient  risks with early intercourse or use of any foreign objects (externally or internally) can increase your risk including but not limited to the risk of vaginal cuff separation and or infection, risks for bowel involvement, risk for emergent surgery, and hospital admission with need for antibiotics.  Discussed in cases with cuff separation and bowel involvement there may be the need for colostomy placement as well.  In no situation should she have intercourse unless cleared to do so.  This can be anywhere from 10 weeks or longer after surgery.  General instructions YOU MAY TAKE SHOWERS ONLY FOR 2 WEEKS AFTER SURGERY, THEN YOU MAY USE TUBS AND HOT TUBS OR SWIM AFTER THAT Do not douche, use tampons, or have sex for at least 10 weeks, or possibly longer. You will need to have an exam done in the office to be cleared to have intercourse. If you struggle with physical or emotional changes after your procedure, speak with your health care provider or a therapist.  IF YOU HAVE BURNING WITH URINATION, PLEASE CALL YOUR DOCTOR. BLADDER INFECTIONS MAY OCCUR AFTER SURGERY Try to have someone at home with you for the first week to help with your daily chores.  Most patients are driving by the end of the first week after the robotic hysterectomy. Wear compression stockings as told by your health care provider. These stockings help to prevent blood clots and reduce swelling in your legs. Keep all follow-up visits. This is important. Contact a health care provider if: You have any of these signs of infection: Chills or a fever 12f OR GREATER. More redness, swelling, or pain around an incision. Fluid or blood coming from an incision. Warmth coming from an incision. Pus or a bad smell coming from an incision. Burning with urination. Urinary frequency or cramping.   IF YOU HAVE THESE SYMPTOMS, PLEASE CALL THE OFFICE TO COME EVALUATE FOR A BLADDER INFECTION AT [phone] An incision opens. You feel dizzy or  light-headed. You have pain or bleeding when you urinate, or you are unable to urinate. You have abnormal vaginal discharge. You have pain that does not get better with medicine. Get help right away if: You have a fever and your symptoms suddenly get worse. You have severe abdominal pain. Heavy vaginal bleeding, like a period You have chest pain or shortness of breath. You may have chest pain and shortness of breath from the CO2 gas for a few days after surgery.  This is very common.  Walking, Gas-X and motrin  will usually help relieve this discomfort You faint. You have pain, swelling, or redness in your leg.  These symptoms may represent a serious problem that is an emergency. Do not wait to see if the symptoms will go away. Get medical help right away. Call your local emergency services (911 in the [place] Do not drive yourself to the hospital. Summary  CONSTIPATION MEDICATION AFTER SURGERY: COLACE, MOM, MIRALAX , GAS X are all helpful to have on hand, if needed.  FILL ALL POSTOP MEDICATION BEFORE SURGERY    [email or web address] is a nice blog site for women preparing for  the robotic hysterectomy    Heparin was given today after surgery for DVT prophylaxis.  Begin lovenox tomorrow AM daily for 14 days Try to eat soft foods and do daily miralax  for the next 14 days. Colace daily for this month starting tomorrow  RTC in 2 weeks or sooner with any concerns

## 2024-08-30 NOTE — Anesthesia Postprocedure Evaluation (Signed)
 Anesthesia Post Note  Patient: [name]  Procedure(s) Performed: HYSTERECTOMY, TOTAL, LAPAROSCOPIC, ROBOT-ASSISTED WITH SALPINGECTOMY (Bilateral: Pelvis) CYSTOSCOPY (Bladder) EXCISION, ENDOMETRIOSIS, ROBOTIC ASSISTED, LAPAROSCOPIC (Pelvis) PROCTOSCOPY with LARGE BOWEL SEROSAL REPAIR (Rectum)     Patient location during evaluation: PACU Anesthesia Type: General Level of consciousness: awake Pain management: pain level controlled Vital Signs Assessment: post-procedure vital signs reviewed and stable Respiratory status: spontaneous breathing, nonlabored ventilation and respiratory function stable Cardiovascular status: blood pressure returned to baseline and stable Postop Assessment: no apparent nausea or vomiting Anesthetic complications: no   No notable events documented.  Last Vitals:  Vitals:   08/30/24 1745 08/30/24 1800  BP: (!) 132/96 (!) 137/95  Pulse: 82 82  Resp: (!) 22 14  Temp:  37.1 C  SpO2: 99% 98%    Last Pain:  Vitals:   08/30/24 1603  TempSrc:   PainSc: Asleep                 Delon Aisha Arch

## 2024-08-30 NOTE — Interval H&P Note (Signed)
 History and Physical Interval Note:  08/30/2024 12:51 PM  [name]  has presented today for surgery, with the diagnosis of menorrhagia with regular cycle, dysmenorrhea, Hx of DVT, endometriosis, iron deficiency anemia due to chronic blood loss.  The various methods of treatment have been discussed with the patient and family. After consideration of risks, benefits and other options for treatment, the patient has consented to  Procedure(s): HYSTERECTOMY, TOTAL, LAPAROSCOPIC, ROBOT-ASSISTED WITH SALPINGECTOMY (Bilateral) CYSTOSCOPY (N/A) as a surgical intervention.  The patient's history has been reviewed, patient examined, no change in status, stable for surgery.  I have reviewed the patient's chart and labs.  Questions were answered to the patient's satisfaction.     [name]

## 2024-08-31 ENCOUNTER — Encounter (HOSPITAL_COMMUNITY): Payer: Self-pay | Admitting: Obstetrics and Gynecology

## 2024-08-31 DIAGNOSIS — Z0289 Encounter for other administrative examinations: Secondary | ICD-10-CM

## 2024-09-01 LAB — SURGICAL PATHOLOGY

## 2024-09-02 ENCOUNTER — Ambulatory Visit: Payer: Self-pay | Admitting: Obstetrics and Gynecology

## 2024-09-07 ENCOUNTER — Encounter: Payer: Self-pay | Admitting: Obstetrics and Gynecology

## 2024-09-07 ENCOUNTER — Ambulatory Visit (INDEPENDENT_AMBULATORY_CARE_PROVIDER_SITE_OTHER): Admitting: Obstetrics and Gynecology

## 2024-09-07 VITALS — BP 102/64 | Wt 213.0 lb

## 2024-09-07 DIAGNOSIS — Z09 Encounter for follow-up examination after completed treatment for conditions other than malignant neoplasm: Secondary | ICD-10-CM

## 2024-09-07 NOTE — Progress Notes (Signed)
 Patient presents for 2 week postop from [facility], bilateral salpingectomy, cystoscopy. She is doing well. No fevers, VB, dysuria or severe abdominal pain.  BP 102/64 (BP Location: Right Arm, Patient Position: Sitting, Cuff Size: Large)   Wt 213 lb (96.6 kg)   LMP 08/19/2024 (Approximate)   BMI 31.45 kg/m   Abdomen: incisions I/c/d, NT, ND  A/p PO from [facility] 2 weeks doing well Encouraged no heavy lifting, pushing, pulling greater than 13 lbs for full 6 weeks 2. Pelvic rest until cleared.  Counseled on risks of early intercourse 3. RTC with any concerns or with heavy bleeding, fevers or severe abdominal pain and at 6 wk and 10wk PO.  Dr. Glennon

## 2024-09-08 DIAGNOSIS — Z0289 Encounter for other administrative examinations: Secondary | ICD-10-CM

## 2024-09-09 ENCOUNTER — Other Ambulatory Visit: Payer: Self-pay | Admitting: Obstetrics and Gynecology

## 2024-09-09 ENCOUNTER — Encounter: Payer: Self-pay | Admitting: Obstetrics and Gynecology

## 2024-09-09 ENCOUNTER — Encounter: Admitting: Obstetrics and Gynecology

## 2024-09-09 MED ORDER — VEOZAH 45 MG PO TABS: 1.0000 | ORAL_TABLET | Freq: Every day | ORAL | 0 refills | Status: DC

## 2024-09-09 NOTE — Telephone Encounter (Signed)
 Patient is s/p  HYSTERECTOMY, TOTAL, LAPAROSCOPIC, ROBOT-ASSISTED WITH SALPINGECTOMY (Bilateral: Pelvis) CYSTOSCOPY (Bladder) EXCISION, ENDOMETRIOSIS, ROBOTIC ASSISTED, LAPAROSCOPIC (Pelvis) PROCTOSCOPY with LARGE BOWEL SEROSAL REPAIR  On 08/30/24  Routing to Dr. Glennon to advise on hot flashes

## 2024-09-10 ENCOUNTER — Encounter: Payer: Self-pay | Admitting: Obstetrics and Gynecology

## 2024-09-10 NOTE — Progress Notes (Signed)
 Returned call from answering service  Mother answered reporting patient having increasing hot flashes, sweating, mood swings and restlessness. Denies fevers, chest pain, SOB, nausea, vomiting, abdominal pain and vaginal bleeding. Noted that veozah  sent to [facility] pharmacy and to check with pharmacy if they have it. Avoidance of estrogen products give DVT hx. Reviewed conservative methods for hot flashes and signs/symptoms to promo immediate evaluation in the ED. All question La answered

## 2024-09-20 ENCOUNTER — Telehealth: Payer: Self-pay

## 2024-09-20 NOTE — Telephone Encounter (Signed)
 Coverage determination request form received from [facility] for Veozah  45 mg.  Forms completed and faxed to [phone].

## 2024-09-22 ENCOUNTER — Telehealth: Payer: Self-pay

## 2024-09-22 ENCOUNTER — Encounter: Payer: Self-pay | Admitting: Obstetrics and Gynecology

## 2024-09-22 NOTE — Telephone Encounter (Signed)
 Form printed and Dr. Glennon notified to sign.

## 2024-09-22 NOTE — Telephone Encounter (Signed)
 Spoke to patient, patient was transferred to the front desk to schedule consult appointment.

## 2024-09-22 NOTE — Telephone Encounter (Signed)
 [facility] [employer] prior authorization for Veozah : Additional information is needed to determine coverage for Veozah : Documentation needs to be provided showing patient has tried and failed or had contraindication for SSRI, SNRI or gabapentin. I do not see where the patient has tried, failed or has contraindication to these preferred formulary alternatives. Advise. Sent to provider for review.

## 2024-09-22 NOTE — Telephone Encounter (Signed)
 Patient is a post op patient who had a hysterectomy on 08-30-24. She called and stated she had went to work on Monday. When she went in on Tuesday, they gave her a note and told her she needed to bring it to us  to fill out before she can come back to work. She said she came up here yesterday and brought us  the note to be filled out. Routing to Dr Glennon

## 2024-09-23 NOTE — Telephone Encounter (Signed)
 See 09/22/24 MyChart encounter.   Encounter closed.

## 2024-09-27 ENCOUNTER — Ambulatory Visit: Admitting: Obstetrics and Gynecology

## 2024-10-03 ENCOUNTER — Other Ambulatory Visit: Payer: Self-pay | Admitting: Family Medicine

## 2024-10-03 DIAGNOSIS — R7303 Prediabetes: Secondary | ICD-10-CM

## 2024-10-10 ENCOUNTER — Encounter: Admitting: Obstetrics and Gynecology

## 2024-10-10 ENCOUNTER — Encounter: Payer: Self-pay | Admitting: Obstetrics and Gynecology

## 2024-10-10 ENCOUNTER — Ambulatory Visit: Payer: Self-pay | Admitting: Obstetrics and Gynecology

## 2024-10-10 VITALS — BP 116/76 | HR 72 | Resp 16

## 2024-10-10 DIAGNOSIS — N898 Other specified noninflammatory disorders of vagina: Secondary | ICD-10-CM

## 2024-10-10 DIAGNOSIS — Z09 Encounter for follow-up examination after completed treatment for conditions other than malignant neoplasm: Secondary | ICD-10-CM

## 2024-10-10 LAB — WET PREP FOR TRICH, YEAST, CLUE

## 2024-10-10 MED ORDER — METRONIDAZOLE 500 MG PO TABS: 500.0000 mg | ORAL_TABLET | Freq: Two times a day (BID) | ORAL | 0 refills | Status: DC

## 2024-10-10 NOTE — Addendum Note (Signed)
 Addended by: GLENNON ALMARIE POUR on: 10/10/2024 04:27 PM   Modules accepted: Orders

## 2024-10-10 NOTE — Progress Notes (Addendum)
 Patient presents for 6 week postop from [facility], bilateral salpingectomy, cystoscopy. She is doing well. No fevers, VB, dysuria or severe abdominal pain. Occasional hot flushes but are not bothersome.  BP 116/76   Pulse 72   Resp 16   LMP 08/10/2024   SVE: sutures seen and dissolving, possible BV, no bleeding  A/p PO from [facility] 6 weeks doing well Resume regular physical activities 2.  No intercourse until cleared. Repeat exam at 10 wks. Wet mount sent today   Dr. Glennon

## 2024-10-10 NOTE — Patient Instructions (Addendum)
 The wet mount did show BV.  I have sent in flagyl . This is not a STI. Do not drink any alcohol with the medication. Dr. Glennon  If you want to come for a lab visit or we can run at the 10 wk to check the fsh and estrogen level, let us  know. Dr. Glennon

## 2024-10-18 ENCOUNTER — Other Ambulatory Visit: Payer: Self-pay | Admitting: Family Medicine

## 2024-10-18 DIAGNOSIS — E559 Vitamin D deficiency, unspecified: Secondary | ICD-10-CM

## 2024-10-22 ENCOUNTER — Emergency Department (HOSPITAL_BASED_OUTPATIENT_CLINIC_OR_DEPARTMENT_OTHER)
Admission: EM | Admit: 2024-10-22 | Discharge: 2024-10-22 | Disposition: A | Discharge status: Home / Self Care | Source: Home / Self Care | Attending: Emergency Medicine | Admitting: Emergency Medicine

## 2024-10-22 ENCOUNTER — Emergency Department (HOSPITAL_BASED_OUTPATIENT_CLINIC_OR_DEPARTMENT_OTHER)

## 2024-10-22 ENCOUNTER — Other Ambulatory Visit: Payer: Self-pay

## 2024-10-22 ENCOUNTER — Encounter (HOSPITAL_BASED_OUTPATIENT_CLINIC_OR_DEPARTMENT_OTHER): Payer: Self-pay | Admitting: Emergency Medicine

## 2024-10-22 DIAGNOSIS — M79605 Pain in left leg: Secondary | ICD-10-CM | POA: Insufficient documentation

## 2024-10-22 DIAGNOSIS — I10 Essential (primary) hypertension: Secondary | ICD-10-CM | POA: Diagnosis not present

## 2024-10-22 DIAGNOSIS — Z79899 Other long term (current) drug therapy: Secondary | ICD-10-CM | POA: Diagnosis not present

## 2024-10-22 LAB — BASIC METABOLIC PANEL WITH GFR
Anion gap: 11 (ref 5–15)
BUN: 8 mg/dL (ref 6–20)
CO2: 23 mmol/L (ref 22–32)
Calcium: 9 mg/dL (ref 8.9–10.3)
Chloride: 106 mmol/L (ref 98–111)
Creatinine, Ser: 0.84 mg/dL (ref 0.44–1.00)
GFR, Estimated: >60 mL/min
Glucose, Bld: 107 mg/dL — ABNORMAL HIGH (ref 70–99)
Potassium: 4.6 mmol/L (ref 3.5–5.1)
Sodium: 140 mmol/L (ref 135–145)

## 2024-10-22 LAB — CBC WITH DIFFERENTIAL/PLATELET
Abs Immature Granulocytes: 0.03 K/uL (ref 0.00–0.07)
Basophils Absolute: 0 K/uL (ref 0.0–0.1)
Basophils Relative: 0 %
Eosinophils Absolute: 0.1 K/uL (ref 0.0–0.5)
Eosinophils Relative: 1 %
HCT: 40.3 % (ref 36.0–46.0)
Hemoglobin: 12.9 g/dL (ref 12.0–15.0)
Immature Granulocytes: 0 %
Lymphocytes Relative: 22 %
Lymphs Abs: 1.6 K/uL (ref 0.7–4.0)
MCH: 23.2 pg — ABNORMAL LOW (ref 26.0–34.0)
MCHC: 32 g/dL (ref 30.0–36.0)
MCV: 72.6 fL — ABNORMAL LOW (ref 80.0–100.0)
Monocytes Absolute: 0.6 K/uL (ref 0.1–1.0)
Monocytes Relative: 8 %
Neutro Abs: 5.1 K/uL (ref 1.7–7.7)
Neutrophils Relative %: 69 %
Platelets: UNDETERMINED K/uL (ref 150–400)
RBC: 5.55 MIL/uL — ABNORMAL HIGH (ref 3.87–5.11)
RDW: 15.9 % — ABNORMAL HIGH (ref 11.5–15.5)
WBC: 7.4 K/uL (ref 4.0–10.5)
nRBC: 0 % (ref 0.0–0.2)

## 2024-10-22 NOTE — ED Triage Notes (Addendum)
 Pt reports hx of DVT, reports LLE calf pain x 3 day with intermittent claudication, denies [facility] or other s/s

## 2024-10-22 NOTE — Discharge Instructions (Addendum)
 Your ultrasound did not show any evidence of blood clot. Your blood work was collected but it was not processed at the time of discharge.  We agreed to wait for the results.  If they do get processed we can follow-up on the results on MyChart.  If lab has not started processing them I will go ahead and cancel labs. Please follow-up with your primary care doctor. Elevating the legs, and pumping your legs if you are sitting for prolonged period of time can help with the swelling.  For any worsening symptoms return to the emergency department for evaluation. Take Tylenol  and ibuprofen  for pain control.

## 2024-10-22 NOTE — ED Provider Notes (Signed)
 " [place] [facility] Provider Note   CSN: [id number] Arrival date & time: 10/22/24  9049     Patient presents with: Leg Pain   [name] is a 34 y.o. female.   34 year old female with past medical history significant for DVT in her left lower extremity in June 2025 which was provoked by being on 3 different birth control pills simultaneously.  Recently underwent hysterectomy in November.  States she was on anticoagulant for couple weeks after her surgery.  Has not been on it since then.  Denies any chest pain, shortness of breath but states for the past 3 days she has had pain in the left lower leg.  Feels similar to when she had a DVT back in June.  Denies any recent long travel.  Currently not on any birth control.  The history is provided by the patient. No language interpreter was used.       Prior to Admission medications  Medication Sig Start Date End Date Taking? Authorizing Provider  Fezolinetant  (VEOZAH ) 45 MG TABS Take 1 tablet (45 mg total) by mouth daily. Patient not taking: Reported on 10/10/2024 09/09/24   Glennon Almarie POUR, MD  Cholecalciferol (VITAMIN D3) 1.25 MG (50000 UT) CAPS Take 1 capsule (1.25 mg total) by mouth every 7 (seven) days. 07/26/24   Billy Philippe SAUNDERS, NP  Cyanocobalamin  (B-12 PO) Take 1 capsule by mouth daily.    [provider]  docusate sodium  (COLACE) 100 MG capsule Take 1 capsule (100 mg total) by mouth daily. 08/30/24   Glennon Almarie POUR, MD  METAMUCIL FIBER PO Take by mouth.    [provider]  metFORMIN  (GLUCOPHAGE -XR) 500 MG 24 hr tablet TAKE 1 TABLET BY MOUTH EVERY DAY WITH BREAKFAST 10/03/24   Williamson, Joanna R, NP  metroNIDAZOLE  (FLAGYL ) 500 MG tablet Take 1 tablet (500 mg total) by mouth 2 (two) times daily. 10/10/24   Glennon Almarie POUR, MD  olmesartan  (BENICAR ) 20 MG tablet Take 1 tablet (20 mg total) by mouth daily. 04/15/24   Billy Philippe SAUNDERS, NP     Allergies: Patient has no known allergies.    Review of Systems  Constitutional:  Negative for chills and fever.  Cardiovascular:  Positive for leg swelling.  Musculoskeletal:  Positive for myalgias.  All other systems reviewed and are negative.   Updated Vital Signs BP (!) 124/93 (BP Location: Right Arm)   Pulse 91   Temp 99.1 F (37.3 C) (Oral)   Resp 18   Ht 5' 9 (1.753 m)   Wt 98.4 kg   LMP 08/10/2024   SpO2 99%   BMI 32.05 kg/m   Physical Exam Vitals and nursing note reviewed.  Constitutional:      General: She is not in acute distress.    Appearance: Normal appearance. She is not ill-appearing.  HENT:     Head: Normocephalic and atraumatic.     Nose: Nose normal.  Eyes:     Conjunctiva/sclera: Conjunctivae normal.  Cardiovascular:     Rate and Rhythm: Normal rate.  Pulmonary:     Effort: Pulmonary effort is normal. No respiratory distress.  Musculoskeletal:        General: No deformity.     Right lower leg: No edema.     Left lower leg: Edema (Nonpitting left lower extremity swelling noted) present.     Comments: Neurovascularly intact in left lower extremity.  Skin:    Findings: No rash.  Neurological:  Mental Status: She is alert.     (all labs ordered are listed, but only abnormal results are displayed) Labs Reviewed  CBC WITH DIFFERENTIAL/PLATELET  BASIC METABOLIC PANEL WITH GFR    EKG: None  Radiology: No results found.   Procedures   Medications Ordered in the ED - No data to display                                  Medical Decision Making Amount and/or Complexity of Data Reviewed Labs: ordered.   Medical Decision Making / ED Course   This patient presents to the ED for concern of left lower extremity pain and swelling, this involves an extensive number of treatment options, and is a complaint that carries with it a high risk of complications and morbidity.  The differential diagnosis includes DVT, claudication,  fracture, contusion  MDM: 34 year old female presents with above-mentioned complaint.  Overall she is well-appearing.  Does have asymmetrical swelling.  No symptoms of PE.  Will obtain ultrasound and basic blood work.  Ultrasound without evidence of DVT. Basic blood work not resulted.  After discussion with patient she does not want to wait for these results.  She states if they do get processed she will follow-up on the results on MyChart.  I attempted to reach out to the lab since they were showing up as collected to see if we could cancel them however I was unable to get a hold of them. Will discharge patient.   Discharged in stable condition.  Discussed close follow-up with PCP.  Likely dependent edema.   Lab Tests: -I ordered, reviewed, and interpreted labs.   The pertinent results include:   Labs Reviewed  CBC WITH DIFFERENTIAL/PLATELET  BASIC METABOLIC PANEL WITH GFR      EKG  EKG Interpretation Date/Time:    Ventricular Rate:    PR Interval:    QRS Duration:    QT Interval:    QTC Calculation:   R Axis:      Text Interpretation:           Imaging Studies ordered: I ordered imaging studies including dvt study I independently visualized and interpreted imaging. I agree with the radiologist interpretation   Medicines ordered and prescription drug management: No orders of the defined types were placed in this encounter.   -I have reviewed the patients home medicines and have made adjustments as needed  Reevaluation: After the interventions noted above, I reevaluated the patient and found that they have :stayed the same  Co morbidities that complicate the patient evaluation  Past Medical History:  Diagnosis Date   Anemia    Arthritis    Dysmenorrhea    Endometriosis determined by laparoscopy    2025 stage 3   History of abnormal cervical Pap smear    History of DVT in adulthood 04/19/2024   ED visit in epic ;  dx  LLE popliteal tibial vein DVT  started on xarelto ;   followed by pcp,  pcp had followed up ultrasound done 07-27-2024  DVT resolved told patient she could discontinue xarelto  on 07-28-2024   Hypertension    Menorrhagia with irregular cycle    Migraines    Right side   Prediabetes    Psychogenic nonepileptic seizure 2012   (08-25-2024 last one 2022-2023 approx)  in epic back 10-30-2010 ED admission/1st seizure given dilantin  (pt did not continue taking),in hospital EEG normal, but had 3  episodes nonepipletic felt to be pseudoseizure ;   pt went to [facility] in care everywhere another EEG was normal ;    since that time no medication has followed with a neurologist and episodes have been long periods of time in between   Vitamin B12 deficiency    Vitamin D  deficiency    Wears glasses       Dispostion: Discharged in stable condition.  Return precautions discussed.  Patient voices understanding and is in agreement with plan.  Final diagnoses:  Left leg pain    ED Discharge Orders     None          Hildegard Loge, [place] 10/22/24 1133  "

## 2024-10-26 ENCOUNTER — Ambulatory Visit: Admitting: Family Medicine

## 2024-10-26 VITALS — BP 122/80 | Temp 97.8°F | Ht 68.5 in | Wt 214.8 lb

## 2024-10-26 DIAGNOSIS — I10 Essential (primary) hypertension: Secondary | ICD-10-CM | POA: Diagnosis not present

## 2024-10-26 DIAGNOSIS — R7303 Prediabetes: Secondary | ICD-10-CM | POA: Diagnosis not present

## 2024-10-26 DIAGNOSIS — Z862 Personal history of diseases of the blood and blood-forming organs and certain disorders involving the immune mechanism: Secondary | ICD-10-CM | POA: Diagnosis not present

## 2024-10-26 DIAGNOSIS — E538 Deficiency of other specified B group vitamins: Secondary | ICD-10-CM | POA: Diagnosis not present

## 2024-10-26 DIAGNOSIS — E559 Vitamin D deficiency, unspecified: Secondary | ICD-10-CM

## 2024-10-26 DIAGNOSIS — R5383 Other fatigue: Secondary | ICD-10-CM

## 2024-10-26 DIAGNOSIS — Z86718 Personal history of other venous thrombosis and embolism: Secondary | ICD-10-CM

## 2024-10-26 DIAGNOSIS — R7989 Other specified abnormal findings of blood chemistry: Secondary | ICD-10-CM

## 2024-10-26 LAB — COMPREHENSIVE METABOLIC PANEL WITH GFR
ALT: 12 U/L (ref 3–35)
AST: 15 U/L (ref 5–37)
Albumin: 3.9 g/dL (ref 3.5–5.2)
Alkaline Phosphatase: 46 U/L (ref 39–117)
BUN: 8 mg/dL (ref 6–23)
CO2: 26 meq/L (ref 19–32)
Calcium: 8.8 mg/dL (ref 8.4–10.5)
Chloride: 107 meq/L (ref 96–112)
Creatinine, Ser: 0.77 mg/dL (ref 0.40–1.20)
GFR: 100.82 mL/min
Glucose, Bld: 111 mg/dL — ABNORMAL HIGH (ref 70–99)
Potassium: 4.1 meq/L (ref 3.5–5.1)
Sodium: 139 meq/L (ref 135–145)
Total Bilirubin: 0.4 mg/dL (ref 0.2–1.2)
Total Protein: 6.7 g/dL (ref 6.0–8.3)

## 2024-10-26 LAB — CBC WITH DIFFERENTIAL/PLATELET
Basophils Absolute: 0 K/uL (ref 0.0–0.1)
Basophils Relative: 0.9 % (ref 0.0–3.0)
Eosinophils Absolute: 0.1 K/uL (ref 0.0–0.7)
Eosinophils Relative: 2 % (ref 0.0–5.0)
HCT: 39.4 % (ref 36.0–46.0)
Hemoglobin: 12.5 g/dL (ref 12.0–15.0)
Lymphocytes Relative: 33.4 % (ref 12.0–46.0)
Lymphs Abs: 1.7 K/uL (ref 0.7–4.0)
MCHC: 31.7 g/dL (ref 30.0–36.0)
MCV: 72.8 fl — ABNORMAL LOW (ref 78.0–100.0)
Monocytes Absolute: 0.4 K/uL (ref 0.1–1.0)
Monocytes Relative: 7.7 % (ref 3.0–12.0)
Neutro Abs: 2.9 K/uL (ref 1.4–7.7)
Neutrophils Relative %: 56 % (ref 43.0–77.0)
Platelets: 205 K/uL (ref 150.0–400.0)
RBC: 5.42 Mil/uL — ABNORMAL HIGH (ref 3.87–5.11)
RDW: 15.9 % — ABNORMAL HIGH (ref 11.5–15.5)
WBC: 5.1 K/uL (ref 4.0–10.5)

## 2024-10-26 LAB — HEMOGLOBIN A1C: Hgb A1c MFr Bld: 5.8 % (ref 4.6–6.5)

## 2024-10-26 LAB — VITAMIN D 25 HYDROXY (VIT D DEFICIENCY, FRACTURES): VITD: 13.52 ng/mL — ABNORMAL LOW (ref 30.00–100.00)

## 2024-10-26 LAB — VITAMIN B12: Vitamin B-12: 293 pg/mL (ref 211–911)

## 2024-10-26 MED ORDER — OLMESARTAN MEDOXOMIL 20 MG PO TABS: 20.0000 mg | ORAL_TABLET | Freq: Every day | ORAL | 1 refills | Status: AC

## 2024-10-26 NOTE — Assessment & Plan Note (Signed)
 Ordered vitamin B12 lab. Not taking a supplement.

## 2024-10-26 NOTE — Patient Instructions (Signed)
-  It was great to see you this morning,  -Continue all medications. Refilled Olmesartan .  -Ordered labs. Office will call with results and will be available via MyChart.  -Follow up in 3 months for a physical.

## 2024-10-26 NOTE — Assessment & Plan Note (Signed)
 Stable with last A1c. Continue Metformin  500mg  daily. Ordered A1c and CMP.

## 2024-10-26 NOTE — Progress Notes (Signed)
 "  Established Patient Office Visit   Subjective:  Patient ID: [name], female    DOB: [birth year]  Age: 34 y.o. MRN: [id number]  Chief Complaint  Patient presents with   Medical Management of Chronic Issues    Three month follow-up     HPI -HTN: Chronic. Patient is taking Olmesartan  20 mg daily. She has not been monitoring her blood pressure at home. Denies CP, SHOB, HA, lightheadedness, dizziness, or lower extremity edema. She reports she has been out of medication for a couple weeks.  BP Readings from Last 3 Encounters:  10/26/24 122/80  10/22/24 121/82  10/10/24 116/76     -Pre-DM: Chronic. Patient is taking Metformin  500 mg daily. Last A1c was 5.7 on 07/26/2024.    -Vit B12 Deficiency: Patient is not taking oral supplements as recommended. Last vitamin B12 was 499 on 07/26/2024.    -Prior Left Lower Extremity DVT: Patient has stopped Xarelto  20 mg daily. She was diagnosed with DVT on 04/19/2024 at [facility]. She was on birth control at the time. She had a hysterectomy on 08/30/2024 and reports having a smooth recovery.   She was seen at [facility] ED [facility] on 10/22/2024 with concern of another DVT since she was having left leg swelling. US  venous imaging for left lower extremity was negative for a DVT.    -Vitamin D  Deficiency: Patients previous vitamin D  level was 15.27 on 07/26/2024. She reports she took all of her prescription strength vitamin D .   ROS See HPI above     Objective:   BP 122/80   Temp 97.8 F (36.6 C)   Ht 5' 8.5 (1.74 m)   Wt 214 lb 12.8 oz (97.4 kg)   LMP 08/10/2024   BMI 32.19 kg/m    Physical Exam Vitals reviewed.  Constitutional:      General: She is not in acute distress.    Appearance: Normal appearance. She is obese. She is not ill-appearing, toxic-appearing or diaphoretic.  HENT:     Head: Normocephalic and atraumatic.  Eyes:     General:        Right eye: No discharge.        Left eye: No discharge.      Conjunctiva/sclera: Conjunctivae normal.  Cardiovascular:     Rate and Rhythm: Normal rate and regular rhythm.     Heart sounds: Normal heart sounds. No murmur heard.    No friction rub. No gallop.  Pulmonary:     Effort: Pulmonary effort is normal. No respiratory distress.     Breath sounds: Normal breath sounds.  Musculoskeletal:        General: Normal range of motion.  Skin:    General: Skin is warm and dry.  Neurological:     General: No focal deficit present.     Mental Status: She is alert and oriented to person, place, and time. Mental status is at baseline.  Psychiatric:        Mood and Affect: Mood normal.        Behavior: Behavior normal.        Thought Content: Thought content normal.        Judgment: Judgment normal.       Assessment & Plan:  Vitamin D  deficiency, unspecified Assessment & Plan: Ordered vitamin D  level. Not taking a supplement.   Orders: -     VITAMIN D  25 Hydroxy (Vit-D Deficiency, Fractures)  Prediabetes Assessment & Plan: Stable with last A1c. Continue Metformin   500mg  daily. Ordered A1c and CMP.   Orders: -     Comprehensive metabolic panel with GFR -     Hemoglobin A1c  Hypertension, unspecified type Assessment & Plan: Blood pressure controlled. Continue Olmesartan  20mg  daily. Refilled medication. Ordered CMP.   Orders: -     Olmesartan  Medoxomil; Take 1 tablet (20 mg total) by mouth daily.  Dispense: 90 tablet; Refill: 1 -     Comprehensive metabolic panel with GFR  Vitamin B12 deficiency Assessment & Plan: Ordered vitamin B12 lab. Not taking a supplement.   Orders: -     Vitamin B12  History of DVT (deep vein thrombosis)  Abnormal CBC -     CBC with Differential/Platelet -     Iron, TIBC and Ferritin Panel  History of anemia -     CBC with Differential/Platelet -     Iron, TIBC and Ferritin Panel  Fatigue, unspecified type -     CBC with Differential/Platelet -     Comprehensive metabolic panel with GFR -      VITAMIN D  25 Hydroxy (Vit-D Deficiency, Fractures) -     Vitamin B12 -     Iron, TIBC and Ferritin Panel  -Ordered CBC and iron panel with abnormal CBC on 01/03, history of anemia and feeling fatigue.  Return in about 3 months (around 01/24/2025) for physical.   Debbie Kenealy, NP "

## 2024-10-26 NOTE — Assessment & Plan Note (Signed)
 Ordered vitamin D  level. Not taking a supplement.

## 2024-10-26 NOTE — Assessment & Plan Note (Signed)
 Blood pressure controlled. Continue Olmesartan  20mg  daily. Refilled medication. Ordered CMP.

## 2024-10-27 ENCOUNTER — Ambulatory Visit: Payer: Self-pay | Admitting: Family Medicine

## 2024-10-27 DIAGNOSIS — E559 Vitamin D deficiency, unspecified: Secondary | ICD-10-CM

## 2024-10-27 DIAGNOSIS — E538 Deficiency of other specified B group vitamins: Secondary | ICD-10-CM

## 2024-10-27 LAB — IRON,TIBC AND FERRITIN PANEL
%SAT: 20 % (ref 16–45)
Ferritin: 41 ng/mL (ref 16–154)
Iron: 53 ug/dL (ref 40–190)
TIBC: 270 ug/dL (ref 250–450)

## 2024-10-28 MED ORDER — VITAMIN D3 1.25 MG (50000 UT) PO CAPS: 1.2500 mg | ORAL_CAPSULE | ORAL | 0 refills | Status: AC

## 2024-11-02 ENCOUNTER — Encounter: Admitting: Obstetrics and Gynecology

## 2024-11-03 ENCOUNTER — Encounter: Payer: Self-pay | Admitting: Obstetrics and Gynecology

## 2024-11-03 ENCOUNTER — Ambulatory Visit (INDEPENDENT_AMBULATORY_CARE_PROVIDER_SITE_OTHER): Admitting: Obstetrics and Gynecology

## 2024-11-03 VITALS — BP 122/80 | Ht 69.0 in | Wt 213.0 lb

## 2024-11-03 DIAGNOSIS — N898 Other specified noninflammatory disorders of vagina: Secondary | ICD-10-CM

## 2024-11-03 DIAGNOSIS — Z09 Encounter for follow-up examination after completed treatment for conditions other than malignant neoplasm: Secondary | ICD-10-CM

## 2024-11-03 NOTE — Progress Notes (Signed)
 Patient presents for 10 week postop from [facility], bilateral salpingectomy, cystoscopy. She is doing well. No fevers, VB, dysuria or severe abdominal pain.  BP 122/80 (BP Location: Right Arm, Patient Position: Sitting, Cuff Size: Normal)   Ht 5' 9 (1.753 m)   Wt 213 lb (96.6 kg)   LMP 08/10/2024   SpO2 99%   BMI 31.45 kg/m   SVE: sutures seen and dissolving, possible bv infection, no bleeding No tenderness with qtip palpation  A/p PO from [facility] 10 weeks doing well Resume activities 2. Pelvic rest for additional 3 wks 3. Encouraged annual mammograms and resume annual care  4.  Nuswab collected to rule out bv.  Treated last visit as well.  Reports treatment completed.  Dr. Glennon

## 2024-11-05 LAB — SURESWAB® ADVANCED VAGINITIS PLUS,TMA
C. trachomatis RNA, TMA: NOT DETECTED
CANDIDA SPECIES: NOT DETECTED
Candida glabrata: NOT DETECTED
N. gonorrhoeae RNA, TMA: NOT DETECTED
SURESWAB(R) ADV BACTERIAL VAGINOSIS(BV),TMA: NEGATIVE
TRICHOMONAS VAGINALIS (TV),TMA: NOT DETECTED

## 2024-11-06 ENCOUNTER — Ambulatory Visit: Payer: Self-pay | Admitting: Obstetrics and Gynecology

## 2024-11-22 ENCOUNTER — Ambulatory Visit (HOSPITAL_COMMUNITY)
Admission: EM | Admit: 2024-11-22 | Discharge: 2024-11-22 | Disposition: A | Discharge status: Home / Self Care | Source: Home / Self Care

## 2024-11-22 ENCOUNTER — Encounter (HOSPITAL_COMMUNITY): Payer: Self-pay

## 2024-11-22 ENCOUNTER — Other Ambulatory Visit: Payer: Self-pay | Admitting: Obstetrics

## 2024-11-22 ENCOUNTER — Other Ambulatory Visit: Payer: Self-pay | Admitting: Obstetrics and Gynecology

## 2024-11-22 DIAGNOSIS — R6889 Other general symptoms and signs: Secondary | ICD-10-CM

## 2024-11-22 DIAGNOSIS — Z20828 Contact with and (suspected) exposure to other viral communicable diseases: Secondary | ICD-10-CM

## 2024-11-22 LAB — POCT INFLUENZA A/B
Influenza A, POC: NEGATIVE
Influenza B, POC: NEGATIVE

## 2024-11-22 LAB — POC SOFIA SARS ANTIGEN FIA: SARS Coronavirus 2 Ag: NEGATIVE

## 2024-11-22 MED ORDER — PSEUDOEPHEDRINE HCL ER 120 MG PO TB12: 120.0000 mg | ORAL_TABLET | Freq: Two times a day (BID) | ORAL | 0 refills | Status: AC

## 2024-11-22 MED ORDER — BENZONATATE 200 MG PO CAPS: 200.0000 mg | ORAL_CAPSULE | Freq: Three times a day (TID) | ORAL | 0 refills | Status: AC | PRN

## 2024-11-22 MED ORDER — OSELTAMIVIR PHOSPHATE 75 MG PO CAPS: 75.0000 mg | ORAL_CAPSULE | Freq: Two times a day (BID) | ORAL | 0 refills | Status: AC

## 2024-11-22 NOTE — ED Triage Notes (Signed)
 Patient reports that she has had SOB, headache, body aches, a productive cough with clear sputum, N/v, and chills x 2 days.  Patient states she has been taking Nyquil and TheraFlu.  Patient also reports tht a coworker was diagnosed with the Flu 4 days go.

## 2024-11-22 NOTE — Telephone Encounter (Signed)
 Med refill request:   ibuprofen  (ADVIL ) 800 MG tablet  Start:  11/22/24 Disp:  30 tablets Refills:  0  Last ov:  10/10/24   Next AEX:  Not yet scheduled Last MMG (if hormonal med):  N/A Refill authorized? Please Advise.

## 2025-01-24 ENCOUNTER — Encounter: Admitting: Family Medicine
